# Patient Record
Sex: Male | Born: 1950 | Hispanic: Yes | Marital: Married | Smoking: Former smoker
Health system: Southern US, Community
[De-identification: ages and names within clinical notes are randomized; demographics above are authoritative.]

## PROBLEM LIST (undated history)

## (undated) DIAGNOSIS — E119 Type 2 diabetes mellitus without complications: Secondary | ICD-10-CM

## (undated) DIAGNOSIS — M869 Osteomyelitis, unspecified: Secondary | ICD-10-CM

## (undated) DIAGNOSIS — Z992 Dependence on renal dialysis: Secondary | ICD-10-CM

## (undated) DIAGNOSIS — N189 Chronic kidney disease, unspecified: Secondary | ICD-10-CM

## (undated) DIAGNOSIS — I2510 Atherosclerotic heart disease of native coronary artery without angina pectoris: Secondary | ICD-10-CM

## (undated) DIAGNOSIS — I509 Heart failure, unspecified: Secondary | ICD-10-CM

## (undated) HISTORY — PX: ARTERIAL THROMBECTOMY: SHX558

## (undated) HISTORY — PX: VASCULAR SURGERY: SHX849

## (undated) HISTORY — PX: FOOT SURGERY: SHX648

## (undated) HISTORY — DX: Chronic kidney disease, unspecified: N18.9

## (undated) HISTORY — PX: CARDIAC SURGERY: SHX584

## (undated) HISTORY — DX: Type 2 diabetes mellitus without complications: E11.9

---

## 2003-08-17 ENCOUNTER — Other Ambulatory Visit: Payer: Self-pay

## 2004-04-24 ENCOUNTER — Ambulatory Visit: Payer: Self-pay | Admitting: Pain Medicine

## 2004-06-04 ENCOUNTER — Ambulatory Visit: Payer: Self-pay | Admitting: Pain Medicine

## 2004-06-26 ENCOUNTER — Ambulatory Visit: Payer: Self-pay | Admitting: Pain Medicine

## 2004-06-27 ENCOUNTER — Ambulatory Visit: Payer: Self-pay | Admitting: Pain Medicine

## 2004-07-25 ENCOUNTER — Ambulatory Visit: Payer: Self-pay | Admitting: Pain Medicine

## 2004-08-12 ENCOUNTER — Ambulatory Visit: Payer: Self-pay | Admitting: Physician Assistant

## 2005-05-23 ENCOUNTER — Other Ambulatory Visit: Payer: Self-pay

## 2005-05-23 ENCOUNTER — Inpatient Hospital Stay: Payer: Self-pay | Admitting: Podiatry

## 2006-01-29 ENCOUNTER — Inpatient Hospital Stay: Payer: Self-pay | Admitting: Internal Medicine

## 2007-07-06 ENCOUNTER — Inpatient Hospital Stay: Payer: Self-pay | Admitting: Podiatry

## 2007-07-06 ENCOUNTER — Other Ambulatory Visit: Payer: Self-pay

## 2007-07-31 ENCOUNTER — Inpatient Hospital Stay: Payer: Self-pay | Admitting: Internal Medicine

## 2007-08-09 ENCOUNTER — Ambulatory Visit: Payer: Self-pay | Admitting: Family Medicine

## 2007-08-16 ENCOUNTER — Emergency Department: Payer: Self-pay | Admitting: Emergency Medicine

## 2008-06-20 ENCOUNTER — Ambulatory Visit: Payer: Self-pay | Admitting: Family Medicine

## 2008-07-04 ENCOUNTER — Ambulatory Visit: Payer: Self-pay | Admitting: Family Medicine

## 2008-07-11 ENCOUNTER — Ambulatory Visit: Payer: Self-pay | Admitting: Family Medicine

## 2008-07-28 ENCOUNTER — Ambulatory Visit: Payer: Self-pay | Admitting: Vascular Surgery

## 2008-09-21 ENCOUNTER — Inpatient Hospital Stay: Payer: Self-pay | Admitting: Student

## 2008-10-02 ENCOUNTER — Ambulatory Visit: Payer: Self-pay | Admitting: Family Medicine

## 2008-10-08 ENCOUNTER — Ambulatory Visit: Payer: Self-pay

## 2008-10-18 ENCOUNTER — Ambulatory Visit: Payer: Self-pay | Admitting: Family Medicine

## 2008-10-25 ENCOUNTER — Emergency Department: Payer: Self-pay | Admitting: Emergency Medicine

## 2008-10-30 ENCOUNTER — Ambulatory Visit: Payer: Self-pay | Admitting: Family Medicine

## 2008-11-01 ENCOUNTER — Ambulatory Visit: Payer: Self-pay | Admitting: Family Medicine

## 2008-11-06 ENCOUNTER — Ambulatory Visit: Payer: Self-pay | Admitting: Family Medicine

## 2008-11-16 ENCOUNTER — Ambulatory Visit: Payer: Self-pay | Admitting: Specialist

## 2008-11-27 ENCOUNTER — Ambulatory Visit: Payer: Self-pay | Admitting: Specialist

## 2008-11-28 ENCOUNTER — Ambulatory Visit: Payer: Self-pay | Admitting: Family Medicine

## 2008-12-12 ENCOUNTER — Ambulatory Visit: Payer: Self-pay | Admitting: Family Medicine

## 2008-12-13 ENCOUNTER — Ambulatory Visit: Payer: Self-pay | Admitting: Family Medicine

## 2008-12-27 ENCOUNTER — Ambulatory Visit: Payer: Self-pay | Admitting: Family Medicine

## 2009-01-02 ENCOUNTER — Inpatient Hospital Stay: Payer: Self-pay | Admitting: Internal Medicine

## 2009-01-15 ENCOUNTER — Ambulatory Visit: Payer: Self-pay | Admitting: Family Medicine

## 2009-01-23 ENCOUNTER — Emergency Department: Payer: Self-pay | Admitting: Emergency Medicine

## 2009-02-14 ENCOUNTER — Inpatient Hospital Stay: Payer: Self-pay | Admitting: Internal Medicine

## 2009-04-20 ENCOUNTER — Emergency Department: Payer: Self-pay | Admitting: Emergency Medicine

## 2009-06-19 ENCOUNTER — Inpatient Hospital Stay: Payer: Self-pay | Admitting: Internal Medicine

## 2010-02-04 ENCOUNTER — Inpatient Hospital Stay: Payer: Self-pay | Admitting: Internal Medicine

## 2010-03-01 ENCOUNTER — Ambulatory Visit: Payer: Self-pay | Admitting: Internal Medicine

## 2010-03-07 ENCOUNTER — Ambulatory Visit: Payer: Self-pay | Admitting: Internal Medicine

## 2010-03-08 ENCOUNTER — Ambulatory Visit: Payer: Self-pay | Admitting: Internal Medicine

## 2010-03-08 ENCOUNTER — Ambulatory Visit: Payer: Self-pay | Admitting: Emergency Medicine

## 2010-04-07 ENCOUNTER — Ambulatory Visit: Payer: Self-pay | Admitting: Internal Medicine

## 2010-05-07 ENCOUNTER — Ambulatory Visit: Payer: Self-pay | Admitting: Internal Medicine

## 2010-05-15 ENCOUNTER — Emergency Department: Payer: Self-pay | Admitting: Unknown Physician Specialty

## 2010-05-31 ENCOUNTER — Ambulatory Visit: Payer: Self-pay | Admitting: Internal Medicine

## 2010-05-31 ENCOUNTER — Ambulatory Visit: Payer: Self-pay | Admitting: Rheumatology

## 2010-06-05 ENCOUNTER — Emergency Department: Payer: Self-pay | Admitting: Emergency Medicine

## 2010-06-07 ENCOUNTER — Ambulatory Visit: Payer: Self-pay | Admitting: Internal Medicine

## 2010-06-25 ENCOUNTER — Emergency Department: Payer: Self-pay | Admitting: Emergency Medicine

## 2010-07-12 ENCOUNTER — Ambulatory Visit: Payer: Self-pay | Admitting: Internal Medicine

## 2010-08-07 ENCOUNTER — Ambulatory Visit: Payer: Self-pay | Admitting: Internal Medicine

## 2010-09-07 ENCOUNTER — Ambulatory Visit: Payer: Self-pay | Admitting: Internal Medicine

## 2010-10-22 ENCOUNTER — Ambulatory Visit: Payer: Self-pay | Admitting: Internal Medicine

## 2010-11-07 ENCOUNTER — Ambulatory Visit: Payer: Self-pay | Admitting: Internal Medicine

## 2010-12-05 ENCOUNTER — Inpatient Hospital Stay: Payer: Self-pay | Admitting: Internal Medicine

## 2010-12-23 ENCOUNTER — Emergency Department: Payer: Self-pay | Admitting: Emergency Medicine

## 2011-01-03 ENCOUNTER — Ambulatory Visit: Payer: Self-pay | Admitting: Vascular Surgery

## 2011-01-08 ENCOUNTER — Emergency Department: Payer: Self-pay | Admitting: *Deleted

## 2011-01-08 LAB — COMPREHENSIVE METABOLIC PANEL
Albumin: 3.3 g/dL — ABNORMAL LOW (ref 3.4–5.0)
Alkaline Phosphatase: 132 U/L (ref 50–136)
BUN: 14 mg/dL (ref 7–18)
Bilirubin,Total: 0.4 mg/dL (ref 0.2–1.0)
Chloride: 101 mmol/L (ref 98–107)
Co2: 28 mmol/L (ref 21–32)
Creatinine: 3.24 mg/dL — ABNORMAL HIGH (ref 0.60–1.30)
EGFR (African American): 25 — ABNORMAL LOW
Glucose: 167 mg/dL — ABNORMAL HIGH (ref 65–99)
SGPT (ALT): 26 U/L
Total Protein: 7.3 g/dL (ref 6.4–8.2)

## 2011-01-08 LAB — CBC
HCT: 34.2 % — ABNORMAL LOW (ref 40.0–52.0)
Platelet: 218 10*3/uL (ref 150–440)
RDW: 16.5 % — ABNORMAL HIGH (ref 11.5–14.5)
WBC: 5 10*3/uL (ref 3.8–10.6)

## 2011-01-14 LAB — CULTURE, BLOOD (SINGLE)

## 2011-02-11 ENCOUNTER — Ambulatory Visit: Payer: Self-pay | Admitting: Vascular Surgery

## 2011-02-11 LAB — BASIC METABOLIC PANEL
Anion Gap: 14 (ref 7–16)
BUN: 33 mg/dL — ABNORMAL HIGH (ref 7–18)
Creatinine: 5.07 mg/dL — ABNORMAL HIGH (ref 0.60–1.30)
EGFR (African American): 15 — ABNORMAL LOW

## 2011-02-11 LAB — CBC
HCT: 35.9 % — ABNORMAL LOW (ref 40.0–52.0)
HGB: 11.7 g/dL — ABNORMAL LOW (ref 13.0–18.0)
RBC: 3.94 10*6/uL — ABNORMAL LOW (ref 4.40–5.90)
RDW: 17.8 % — ABNORMAL HIGH (ref 11.5–14.5)
WBC: 4.7 10*3/uL (ref 3.8–10.6)

## 2011-02-14 ENCOUNTER — Ambulatory Visit: Payer: Self-pay | Admitting: Vascular Surgery

## 2011-02-27 ENCOUNTER — Ambulatory Visit: Payer: Self-pay | Admitting: Internal Medicine

## 2011-02-27 LAB — CBC CANCER CENTER
Basophil #: 0.1 x10 3/mm (ref 0.0–0.1)
Basophil %: 1.4 %
Eosinophil #: 0.4 x10 3/mm (ref 0.0–0.7)
HCT: 33.1 % — ABNORMAL LOW (ref 40.0–52.0)
HGB: 11 g/dL — ABNORMAL LOW (ref 13.0–18.0)
Lymphocyte #: 1.3 x10 3/mm (ref 1.0–3.6)
Lymphocyte %: 27.9 %
MCHC: 33.2 g/dL (ref 32.0–36.0)
MCV: 91 fL (ref 80–100)
Neutrophil #: 2.7 x10 3/mm (ref 1.4–6.5)
Platelet: 183 x10 3/mm (ref 150–440)
RBC: 3.64 10*6/uL — ABNORMAL LOW (ref 4.40–5.90)
RDW: 16 % — ABNORMAL HIGH (ref 11.5–14.5)

## 2011-03-01 ENCOUNTER — Inpatient Hospital Stay: Payer: Self-pay | Admitting: Internal Medicine

## 2011-03-01 LAB — CK TOTAL AND CKMB (NOT AT ARMC)
CK, Total: 171 U/L (ref 35–232)
CK-MB: 2.8 ng/mL (ref 0.5–3.6)

## 2011-03-01 LAB — COMPREHENSIVE METABOLIC PANEL
Alkaline Phosphatase: 97 U/L (ref 50–136)
Anion Gap: 11 (ref 7–16)
BUN: 29 mg/dL — ABNORMAL HIGH (ref 7–18)
Bilirubin,Total: 0.5 mg/dL (ref 0.2–1.0)
Chloride: 104 mmol/L (ref 98–107)
Co2: 24 mmol/L (ref 21–32)
EGFR (African American): 18 — ABNORMAL LOW
EGFR (Non-African Amer.): 15 — ABNORMAL LOW
SGOT(AST): 38 U/L — ABNORMAL HIGH (ref 15–37)
SGPT (ALT): 27 U/L
Sodium: 139 mmol/L (ref 136–145)
Total Protein: 6.7 g/dL (ref 6.4–8.2)

## 2011-03-01 LAB — TROPONIN I: Troponin-I: 0.15 ng/mL — ABNORMAL HIGH

## 2011-03-01 LAB — CBC
HCT: 32.2 % — ABNORMAL LOW (ref 40.0–52.0)
Platelet: 176 10*3/uL (ref 150–440)
RBC: 3.53 10*6/uL — ABNORMAL LOW (ref 4.40–5.90)
WBC: 4.4 10*3/uL (ref 3.8–10.6)

## 2011-03-02 LAB — LIPID PANEL
Cholesterol: 153 mg/dL (ref 0–200)
HDL Cholesterol: 56 mg/dL (ref 40–60)
Ldl Cholesterol, Calc: 75 mg/dL (ref 0–100)
Triglycerides: 108 mg/dL (ref 0–200)
VLDL Cholesterol, Calc: 22 mg/dL (ref 5–40)

## 2011-03-02 LAB — CBC WITH DIFFERENTIAL/PLATELET
Basophil #: 0 10*3/uL (ref 0.0–0.1)
Eosinophil %: 12.7 %
HCT: 32.2 % — ABNORMAL LOW (ref 40.0–52.0)
HGB: 10.6 g/dL — ABNORMAL LOW (ref 13.0–18.0)
Lymphocyte #: 1.4 10*3/uL (ref 1.0–3.6)
Monocyte #: 0.5 10*3/uL (ref 0.0–0.7)
Monocyte %: 10.6 %
Neutrophil %: 45.6 %
Platelet: 164 10*3/uL (ref 150–440)
RBC: 3.53 10*6/uL — ABNORMAL LOW (ref 4.40–5.90)
WBC: 4.5 10*3/uL (ref 3.8–10.6)

## 2011-03-02 LAB — COMPREHENSIVE METABOLIC PANEL
Bilirubin,Total: 0.3 mg/dL (ref 0.2–1.0)
Calcium, Total: 7.8 mg/dL — ABNORMAL LOW (ref 8.5–10.1)
Chloride: 103 mmol/L (ref 98–107)
Co2: 24 mmol/L (ref 21–32)
Creatinine: 5.6 mg/dL — ABNORMAL HIGH (ref 0.60–1.30)
EGFR (African American): 13 — ABNORMAL LOW
EGFR (Non-African Amer.): 11 — ABNORMAL LOW
Glucose: 147 mg/dL — ABNORMAL HIGH (ref 65–99)
Osmolality: 296 (ref 275–301)
SGPT (ALT): 25 U/L
Sodium: 142 mmol/L (ref 136–145)

## 2011-03-02 LAB — HEMOGLOBIN A1C: Hemoglobin A1C: 6.7 % — ABNORMAL HIGH (ref 4.2–6.3)

## 2011-03-02 LAB — TROPONIN I: Troponin-I: 0.15 ng/mL — ABNORMAL HIGH

## 2011-03-04 ENCOUNTER — Ambulatory Visit: Payer: Self-pay | Admitting: Oncology

## 2011-03-07 ENCOUNTER — Ambulatory Visit: Payer: Self-pay | Admitting: Internal Medicine

## 2011-04-16 ENCOUNTER — Ambulatory Visit: Payer: Self-pay | Admitting: Vascular Surgery

## 2011-04-16 LAB — GLUCOSE, RANDOM: Glucose: 141 mg/dL — ABNORMAL HIGH (ref 65–99)

## 2011-06-04 ENCOUNTER — Ambulatory Visit: Payer: Self-pay | Admitting: Vascular Surgery

## 2011-10-06 ENCOUNTER — Ambulatory Visit: Payer: Self-pay | Admitting: Vascular Surgery

## 2012-04-29 IMAGING — XA IR VASCULAR PROCEDURE
14 of 19 series · 15 of 24 positions shown · IV contrast (IODINE)
Comparison: none

[Series 1: care upper arm · 1 of 2 slices shown (1 of 14)]
[im 1/2]
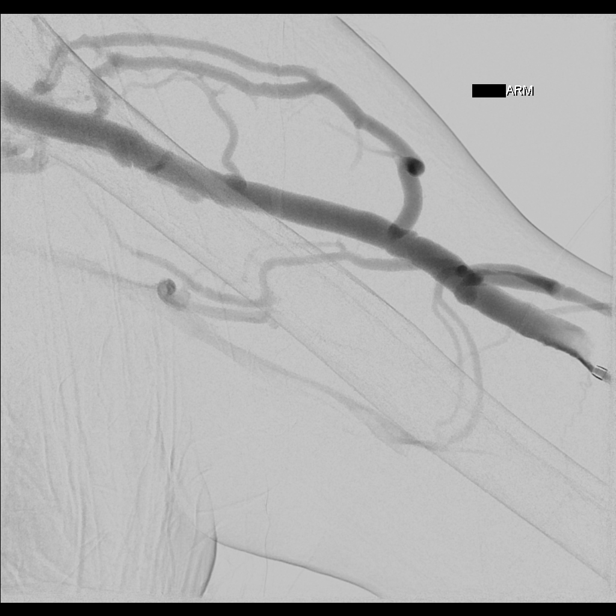

[Series 2: care upper arm · 1 of 2 slices shown (2 of 14)]
[im 1/2]
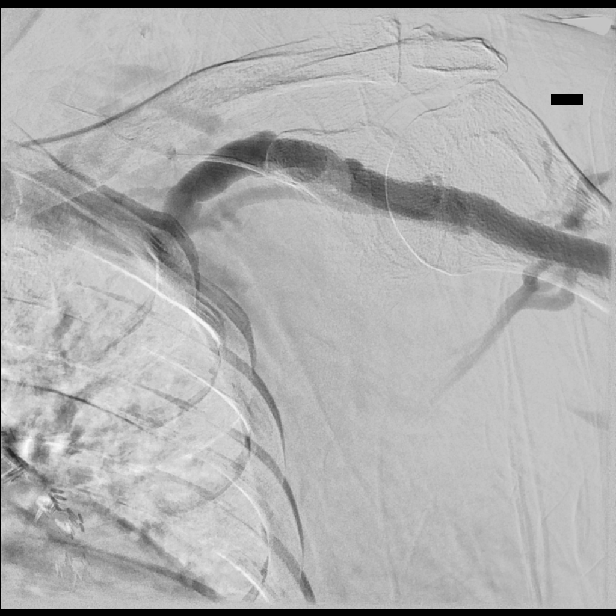

[Series 3: care upper arm · 2 of 2 slices shown (3 of 14)]
[im 1/2]
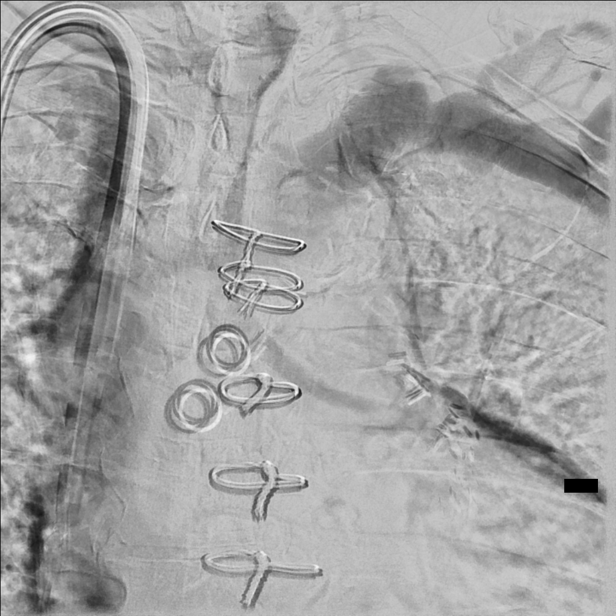
[im 2/2]
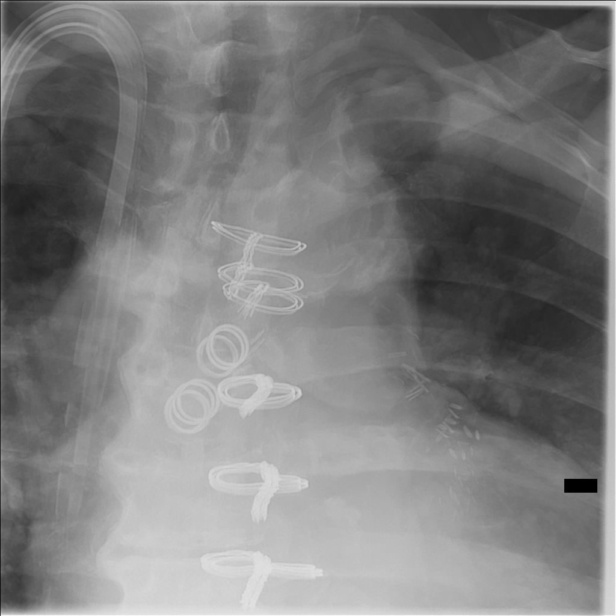

[Series 4: care upper arm · 1 of 2 slices shown (4 of 14)]
[im 2/2]
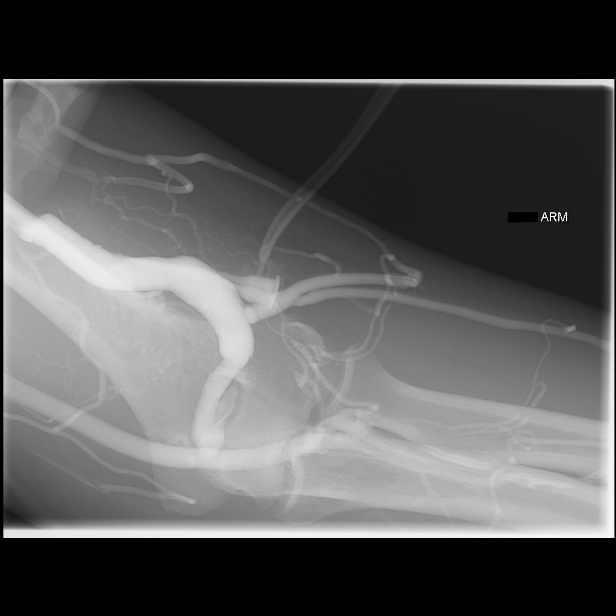

[Series 5: care upper arm · 1 of 2 slices shown (5 of 14)]
[im 1/2]
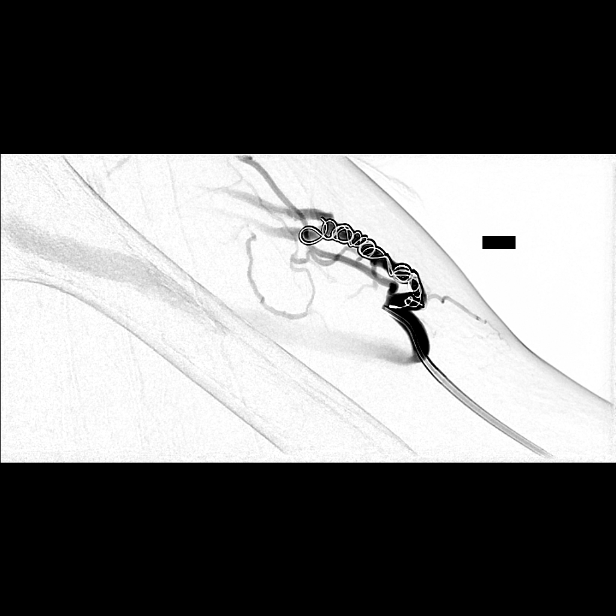

[Series 6: care upper arm · 1 of 2 slices shown (6 of 14)]
[im 1/2]
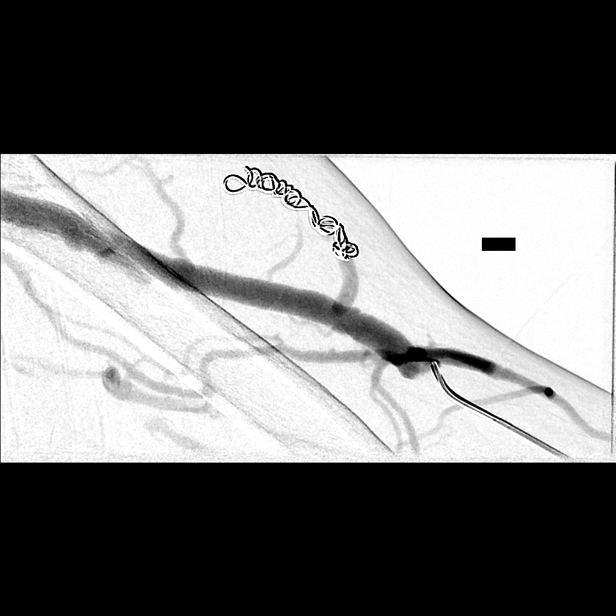

[Series 8: care upper arm · 1 of 2 slices shown (7 of 14)]
[im 1/2]
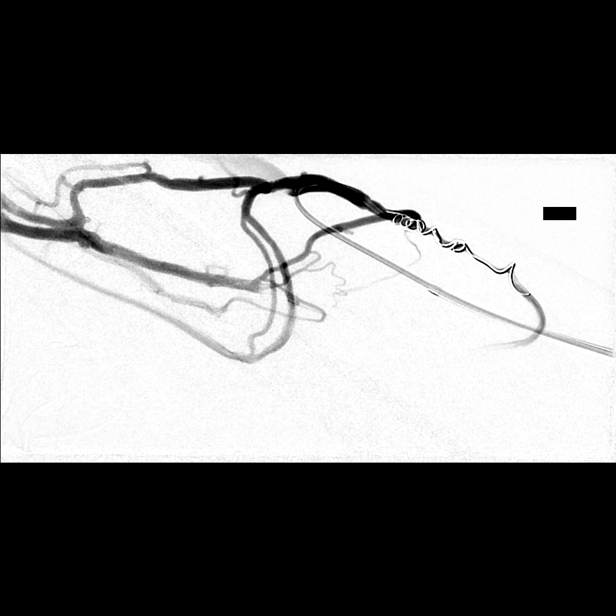

[Series 9: care upper arm · 1 of 2 slices shown (8 of 14)]
[im 1/2]
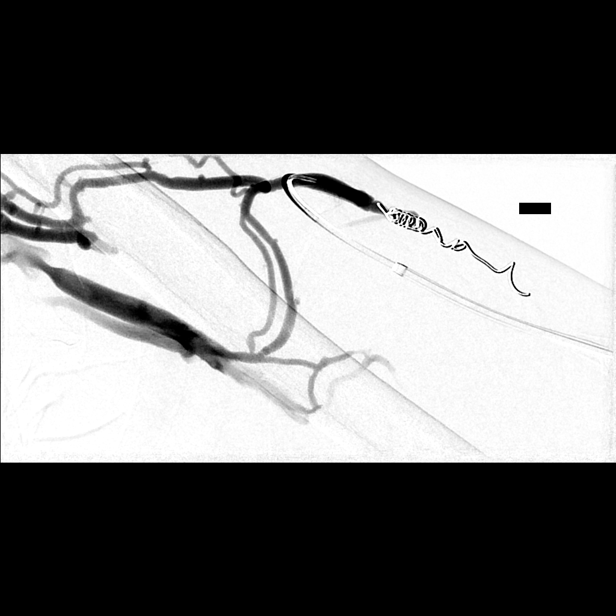

[Series 11: care upper arm · 1 of 2 slices shown (9 of 14)]
[im 1/2]
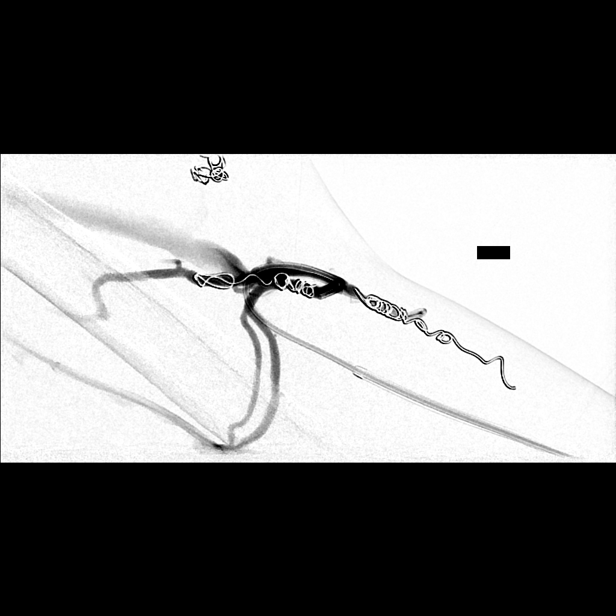

[Series 12: care upper arm · 1 of 2 slices shown (10 of 14)]
[im 1/2]
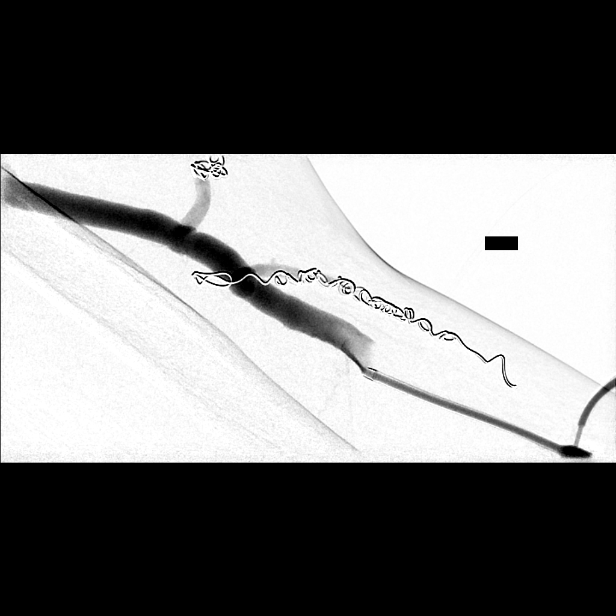

[Series 14: care upper arm · 1 of 2 slices shown (11 of 14)]
[im 1/2]
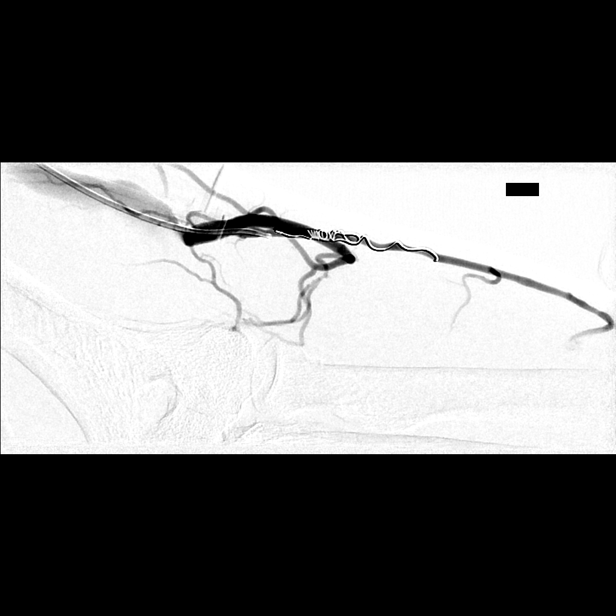

[Series 16: care upper arm · 1 of 2 slices shown (12 of 14)]
[im 1/2]
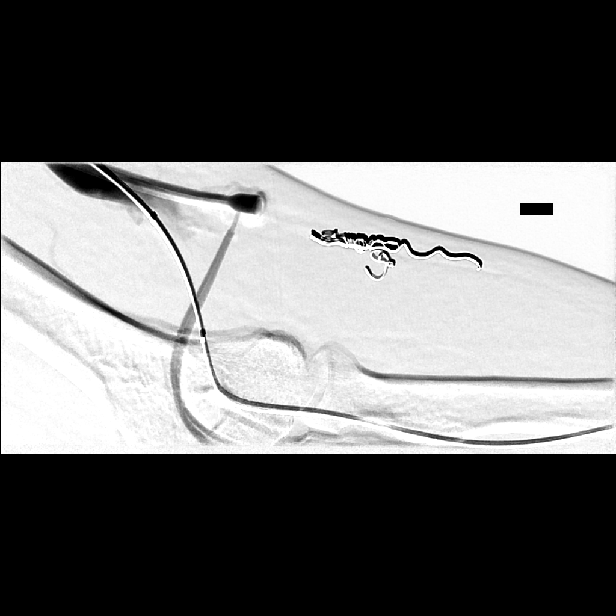

[Series 17: care upper arm · 1 of 2 slices shown (13 of 14)]
[im 1/2]
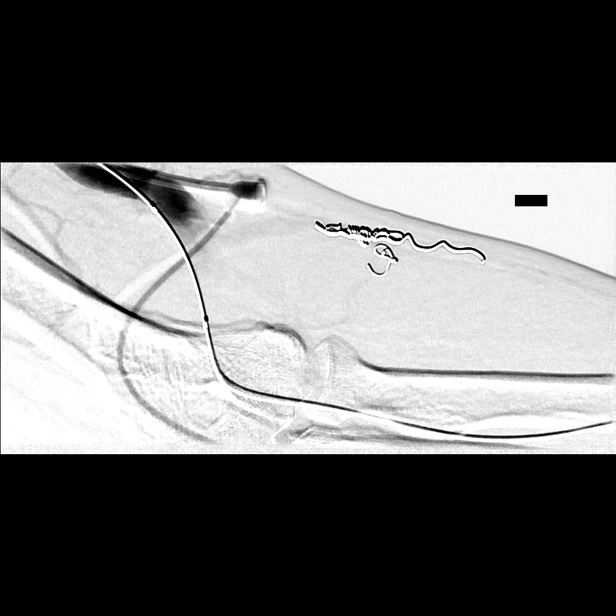

[Series 19: care upper arm · 1 of 2 slices shown (14 of 14)]
[im 1/2]
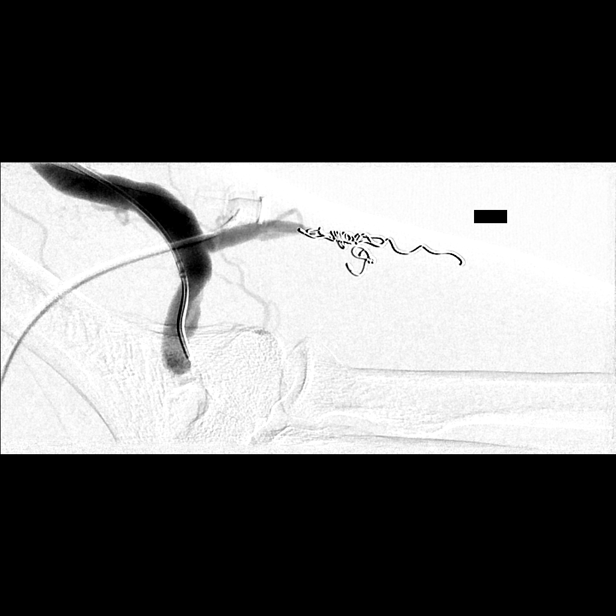

[15 of 24 positions shown; findings below may reference images not displayed]

IMAGES IMPORTED FROM THE [employer] WORKFLOW SYSTEM
NO DICTATION FOR STUDY

## 2012-06-08 ENCOUNTER — Ambulatory Visit: Payer: Self-pay | Admitting: Vascular Surgery

## 2012-07-13 ENCOUNTER — Ambulatory Visit: Payer: Self-pay | Admitting: Physician Assistant

## 2012-08-19 ENCOUNTER — Ambulatory Visit: Payer: Self-pay | Admitting: Vascular Surgery

## 2012-10-01 ENCOUNTER — Observation Stay: Payer: Self-pay | Admitting: Specialist

## 2012-10-01 LAB — BASIC METABOLIC PANEL
Anion Gap: 7 (ref 7–16)
BUN: 25 mg/dL — ABNORMAL HIGH (ref 7–18)
Calcium, Total: 8.5 mg/dL (ref 8.5–10.1)
Chloride: 96 mmol/L — ABNORMAL LOW (ref 98–107)
Co2: 26 mmol/L (ref 21–32)
Creatinine: 4.13 mg/dL — ABNORMAL HIGH (ref 0.60–1.30)
Potassium: 4.1 mmol/L (ref 3.5–5.1)
Sodium: 129 mmol/L — ABNORMAL LOW (ref 136–145)

## 2012-10-01 LAB — PROTIME-INR
INR: 1
Prothrombin Time: 13.4 secs (ref 11.5–14.7)

## 2012-10-01 LAB — CK TOTAL AND CKMB (NOT AT ARMC)
CK, Total: 166 U/L (ref 35–232)
CK-MB: 4.9 ng/mL — ABNORMAL HIGH (ref 0.5–3.6)

## 2012-10-01 LAB — CBC
HGB: 12.9 g/dL — ABNORMAL LOW (ref 13.0–18.0)
MCHC: 34.9 g/dL (ref 32.0–36.0)
Platelet: 145 10*3/uL — ABNORMAL LOW (ref 150–440)
RBC: 3.69 10*6/uL — ABNORMAL LOW (ref 4.40–5.90)

## 2012-10-01 LAB — TROPONIN I
Troponin-I: 0.09 ng/mL — ABNORMAL HIGH
Troponin-I: 0.09 ng/mL — ABNORMAL HIGH

## 2012-10-01 LAB — MAGNESIUM: Magnesium: 2 mg/dL

## 2012-10-02 LAB — LIPID PANEL
HDL Cholesterol: 48 mg/dL (ref 40–60)
Ldl Cholesterol, Calc: 95 mg/dL (ref 0–100)
VLDL Cholesterol, Calc: 27 mg/dL (ref 5–40)

## 2012-10-02 LAB — CBC WITH DIFFERENTIAL/PLATELET
Basophil %: 0.2 %
Eosinophil #: 0.3 10*3/uL (ref 0.0–0.7)
Eosinophil %: 8.4 %
Monocyte #: 0.4 x10 3/mm (ref 0.2–1.0)
RBC: 3.32 10*6/uL — ABNORMAL LOW (ref 4.40–5.90)
WBC: 3.9 10*3/uL (ref 3.8–10.6)

## 2012-10-02 LAB — BASIC METABOLIC PANEL
Anion Gap: 9 (ref 7–16)
BUN: 40 mg/dL — ABNORMAL HIGH (ref 7–18)
Calcium, Total: 8.4 mg/dL — ABNORMAL LOW (ref 8.5–10.1)
Creatinine: 6.21 mg/dL — ABNORMAL HIGH (ref 0.60–1.30)
EGFR (African American): 10 — ABNORMAL LOW
EGFR (Non-African Amer.): 9 — ABNORMAL LOW
Sodium: 129 mmol/L — ABNORMAL LOW (ref 136–145)

## 2012-10-02 LAB — TROPONIN I: Troponin-I: 0.09 ng/mL — ABNORMAL HIGH

## 2012-10-03 ENCOUNTER — Inpatient Hospital Stay: Payer: Self-pay | Admitting: Internal Medicine

## 2012-10-03 LAB — COMPREHENSIVE METABOLIC PANEL
Albumin: 3.5 g/dL (ref 3.4–5.0)
Anion Gap: 9 (ref 7–16)
BUN: 73 mg/dL — ABNORMAL HIGH (ref 7–18)
Calcium, Total: 8 mg/dL — ABNORMAL LOW (ref 8.5–10.1)
Chloride: 97 mmol/L — ABNORMAL LOW (ref 98–107)
Co2: 22 mmol/L (ref 21–32)
Creatinine: 8.73 mg/dL — ABNORMAL HIGH (ref 0.60–1.30)
EGFR (African American): 7 — ABNORMAL LOW
Osmolality: 282 (ref 275–301)
Potassium: 6 mmol/L — ABNORMAL HIGH (ref 3.5–5.1)
SGOT(AST): 23 U/L (ref 15–37)
SGPT (ALT): 19 U/L (ref 12–78)
Sodium: 128 mmol/L — ABNORMAL LOW (ref 136–145)
Total Protein: 7.8 g/dL (ref 6.4–8.2)

## 2012-10-03 LAB — CK TOTAL AND CKMB (NOT AT ARMC)
CK, Total: 105 U/L (ref 35–232)
CK-MB: 4 ng/mL — ABNORMAL HIGH (ref 0.5–3.6)

## 2012-10-03 LAB — TROPONIN I: Troponin-I: 0.07 ng/mL — ABNORMAL HIGH

## 2012-10-03 LAB — PHOSPHORUS: Phosphorus: 7.2 mg/dL — ABNORMAL HIGH (ref 2.5–4.9)

## 2012-10-03 LAB — CBC
HCT: 34.3 % — ABNORMAL LOW (ref 40.0–52.0)
MCV: 100 fL (ref 80–100)
WBC: 4.5 10*3/uL (ref 3.8–10.6)

## 2012-10-03 LAB — PROTIME-INR: Prothrombin Time: 14.8 secs — ABNORMAL HIGH (ref 11.5–14.7)

## 2012-10-04 LAB — BASIC METABOLIC PANEL
Chloride: 97 mmol/L — ABNORMAL LOW (ref 98–107)
Co2: 31 mmol/L (ref 21–32)
Glucose: 75 mg/dL (ref 65–99)
Osmolality: 276 (ref 275–301)
Sodium: 135 mmol/L — ABNORMAL LOW (ref 136–145)

## 2012-10-04 LAB — APTT
Activated PTT: 149.3 secs — ABNORMAL HIGH (ref 23.6–35.9)
Activated PTT: 111.5 secs — ABNORMAL HIGH (ref 23.6–35.9)

## 2012-10-04 LAB — PHOSPHORUS: Phosphorus: 5.2 mg/dL — ABNORMAL HIGH (ref 2.5–4.9)

## 2012-10-05 LAB — HEMOGLOBIN: HGB: 12.3 g/dL — ABNORMAL LOW (ref 13.0–18.0)

## 2012-10-05 LAB — BASIC METABOLIC PANEL
Anion Gap: 5 — ABNORMAL LOW (ref 7–16)
Calcium, Total: 8.5 mg/dL (ref 8.5–10.1)
Chloride: 96 mmol/L — ABNORMAL LOW (ref 98–107)
Co2: 33 mmol/L — ABNORMAL HIGH (ref 21–32)
Creatinine: 4.24 mg/dL — ABNORMAL HIGH (ref 0.60–1.30)
EGFR (Non-African Amer.): 14 — ABNORMAL LOW
Glucose: 73 mg/dL (ref 65–99)
Osmolality: 270 (ref 275–301)
Sodium: 134 mmol/L — ABNORMAL LOW (ref 136–145)

## 2012-11-01 ENCOUNTER — Emergency Department: Payer: Self-pay | Admitting: Emergency Medicine

## 2012-11-01 LAB — COMPREHENSIVE METABOLIC PANEL
Albumin: 3.7 g/dL (ref 3.4–5.0)
Alkaline Phosphatase: 158 U/L — ABNORMAL HIGH (ref 50–136)
BUN: 24 mg/dL — ABNORMAL HIGH (ref 7–18)
Calcium, Total: 8.1 mg/dL — ABNORMAL LOW (ref 8.5–10.1)
Chloride: 99 mmol/L (ref 98–107)
Creatinine: 4.06 mg/dL — ABNORMAL HIGH (ref 0.60–1.30)
EGFR (African American): 17 — ABNORMAL LOW
EGFR (Non-African Amer.): 15 — ABNORMAL LOW
Glucose: 109 mg/dL — ABNORMAL HIGH (ref 65–99)
Osmolality: 269 (ref 275–301)
Potassium: 3.6 mmol/L (ref 3.5–5.1)
SGOT(AST): 29 U/L (ref 15–37)
SGPT (ALT): 29 U/L (ref 12–78)
Sodium: 132 mmol/L — ABNORMAL LOW (ref 136–145)

## 2012-11-01 LAB — CBC
HCT: 30.1 % — ABNORMAL LOW (ref 40.0–52.0)
HGB: 10.6 g/dL — ABNORMAL LOW (ref 13.0–18.0)
MCH: 34.7 pg — ABNORMAL HIGH (ref 26.0–34.0)
MCHC: 35.1 g/dL (ref 32.0–36.0)
MCV: 99 fL (ref 80–100)
Platelet: 145 10*3/uL — ABNORMAL LOW (ref 150–440)
RDW: 15 % — ABNORMAL HIGH (ref 11.5–14.5)
WBC: 4 10*3/uL (ref 3.8–10.6)

## 2012-11-01 LAB — TROPONIN I: Troponin-I: 0.08 ng/mL — ABNORMAL HIGH

## 2013-01-17 ENCOUNTER — Encounter: Payer: Self-pay | Admitting: *Deleted

## 2013-01-18 ENCOUNTER — Ambulatory Visit: Payer: Self-pay | Admitting: Podiatry

## 2013-01-24 ENCOUNTER — Emergency Department: Payer: Self-pay | Admitting: Emergency Medicine

## 2013-01-24 LAB — CBC
HCT: 33.6 % — AB (ref 40.0–52.0)
HGB: 11.4 g/dL — ABNORMAL LOW (ref 13.0–18.0)
MCH: 33.7 pg (ref 26.0–34.0)
MCHC: 33.9 g/dL (ref 32.0–36.0)
MCV: 99 fL (ref 80–100)
PLATELETS: 190 10*3/uL (ref 150–440)
RBC: 3.38 10*6/uL — ABNORMAL LOW (ref 4.40–5.90)
RDW: 14.5 % (ref 11.5–14.5)
WBC: 5.3 10*3/uL (ref 3.8–10.6)

## 2013-01-24 LAB — COMPREHENSIVE METABOLIC PANEL
Albumin: 3.6 g/dL (ref 3.4–5.0)
Alkaline Phosphatase: 131 U/L — ABNORMAL HIGH
Anion Gap: 6 — ABNORMAL LOW (ref 7–16)
BUN: 28 mg/dL — ABNORMAL HIGH (ref 7–18)
Bilirubin,Total: 0.6 mg/dL (ref 0.2–1.0)
CREATININE: 4.72 mg/dL — AB (ref 0.60–1.30)
Calcium, Total: 8.5 mg/dL (ref 8.5–10.1)
Chloride: 97 mmol/L — ABNORMAL LOW (ref 98–107)
Co2: 28 mmol/L (ref 21–32)
EGFR (Non-African Amer.): 12 — ABNORMAL LOW
GFR CALC AF AMER: 14 — AB
Glucose: 107 mg/dL — ABNORMAL HIGH (ref 65–99)
OSMOLALITY: 269 (ref 275–301)
Potassium: 4.1 mmol/L (ref 3.5–5.1)
SGOT(AST): 32 U/L (ref 15–37)
SGPT (ALT): 41 U/L (ref 12–78)
Sodium: 131 mmol/L — ABNORMAL LOW (ref 136–145)
Total Protein: 8.4 g/dL — ABNORMAL HIGH (ref 6.4–8.2)

## 2013-01-24 LAB — TROPONIN I
TROPONIN-I: 0.11 ng/mL — AB
Troponin-I: 0.11 ng/mL — ABNORMAL HIGH

## 2013-07-11 ENCOUNTER — Ambulatory Visit: Payer: Self-pay | Admitting: Ophthalmology

## 2013-07-12 ENCOUNTER — Ambulatory Visit: Payer: Self-pay | Admitting: Ophthalmology

## 2014-04-25 NOTE — Op Note (Signed)
PATIENT NAME:  Travis [name], Deidrick A MR#:  [id number] DATE OF BIRTH:  Jun 09, 1950  DATE OF PROCEDURE:  10/06/2011  PREOPERATIVE DIAGNOSES:  1. Endstage renal disease.  2. Difficulties with function of the fistula on dialysis.  3. Hypertension.  4. Diabetes.   POSTOPERATIVE DIAGNOSES:  1. Endstage renal disease.  2. Difficulties with function of the fistula on dialysis.  3. Hypertension.  4. Diabetes.   PROCEDURES:  1. Ultrasound guidance for vascular access to left brachiocephalic AV fistula.  2. Left upper extremity fistulogram and central venogram.   SURGEON: Annice NeedyJason S. Rasheena Talmadge, M.D.   ANESTHESIA: Local with moderate conscious sedation.   ESTIMATED BLOOD LOSS: 25 mL.   FLUOROSCOPY TIME:  1 minute.  CONTRAST USED: 27 mL.  INDICATION FOR PROCEDURE: This is a 64 year old Hispanic male on dialysis. We are called by the dialysis center due to bleeding and difficulties with function of his fistula on dialysis. Fistulogram is requested. Risks and benefits and details of the procedure were discussed with the patient with the use of an interpreter and informed consent was obtained.    DESCRIPTION OF THE PROCEDURE: The patient was brought to the vascular and interventional radiology suite. The left upper extremity was sterilely prepped and draped, a sterile surgical field was created. The fistula was accessed under direct ultrasound guidance without difficulty with a micropuncture needle. Micropuncture wire and sheath were then placed. Imaging was performed through the micropuncture sheath including compression of the fistula to opacify the anastomotic area. The findings of the fistulogram were of no hemodynamically significant stenosis seen with any portion of the fistula. There were some mild irregularities in several areas and several small branches were identified. There was good flow through the central veins and there was no indication for intervention at this time.  At this point I elected to  terminate the procedure. A 4-0 Monocryl pursestring suture was placed and the sheath was removed. Pressure was held. Sterile dressing was placed.    ____________________________ Annice NeedyJason S. Jontrell Bushong, MD jsd:bjt D: 10/06/2011 11:39:29 ET T: 10/06/2011 11:46:51 ET JOB#: [id number]  cc: Annice NeedyJason S. Emma Schupp, MD, <Dictator> Annice NeedyJASON S Tayanna Talford MD ELECTRONICALLY SIGNED 10/07/2011 13:59

## 2014-04-28 NOTE — Consult Note (Signed)
Brief Consult Note: Diagnosis: CP.   Consult note dictated.   Comments: [name] has a PMH of DM, ESRD of HD, CAD, PAD who presented from dialysis with CP, diaphoresis, SOB. EKG stable from previous with no acute ST/T elevation and has h/o mildly elevated TNI. He has h/o anina and CAD not amenable to PCI and will tx medically as is high risk. Will add Ranexa, nitro paste and follow with you.  Electronic Signatures: Radene KneeKhan, Shaukat Ali (MD)   (Signed 27-Sep-14 09:18)  Co-Signer: Brief Consult Note Maia PlanManzi, Sunday Klos A (PA-C)   (Signed 26-Sep-14 14:20)  Authored: Brief Consult Note  Last Updated: 27-Sep-14 09:18 by Radene KneeKhan, Shaukat Ali (MD)

## 2014-04-28 NOTE — Discharge Summary (Signed)
PATIENT NAME:  Travis [name], Travis [name] MR#:  [id number] DATE OF BIRTH:  11-09-1950  DATE OF ADMISSION:  10/03/2012 DATE OF DISCHARGE:  10/05/2012  ADMITTING DIAGNOSIS: Generalized weakness and not feeling well.   DISCHARGE DIAGNOSES: 1.  Generalized weakness, not feeling well due to idioventricular heart rhythm due to hyperkalemia.  2.  Hyperkalemia due to end-stage renal disease. The patient is status post urgent dialysis.  3.  Chest pain with elevated troponins. Status post cardiac catheterization which shows unchanged cardiac catheterization with chronically occluded graft with patent two other grafts.  4.  End-stage renal disease on hemodialysis.  5.  Coronary artery disease, status post coronary artery bypass graft.  6.  Diabetes.  7.  Hypertension.  8.  Peripheral vascular disease.  9.  History of osteomyelitis status post amputation of the toe on the right foot.  10.  History of left cerebrovascular accident causes right leg weakness.  11.  Right transmetatarsal amputation.  12.  Status post arteriovenous fistula placement on the left.   CONSULTANTS: Cardiology, Dr. Adrian Blackwater as well as Dr. Thedore Mins of nephrology.   Cardiac catheterization shows high-grade lesions in the native mid LAD and  D1. No significant left circumflex or RCA disease. EF 50% to 55%. SVG to RCA occluded but the native RCA was okay. SVG to D1 patent with stents in the ostium. Patent LIMA to LAD.   HOSPITAL COURSE: Please refer to H and P done by the admitting physician. The patient is a pleasant, 64 year old Spanish male who was discharged recently from a hospital, presented back with complaint of not feeling well. His legs felt weak and dizzy. He was brought to the ED. He was also having severe chest pain. The patient's tests showed initially some atrial fibrillation and then he had idioventricular rhythm. He was found to have a troponin of 0.08. Therefore, we were asked to admit the patient. The patient was thought to  have idioventricular rhythm and atrial fibrillation due to severe hypokalemia. The patient was emergently dialyzed. He had no further arrhythmias. He was complaining of chest pain, therefore Cardiology consult was obtained. The patient underwent a cardiac catheterization with the above stable lesions. He was recommended to continue on aggressive medical regimen. Currently, he is chest pain-free. No further arrhythmias. He is stable for discharge.   LABORATORY EVALUATIONS: On admission, troponin 0.08. INR 1.1, PTT 33.1. WBC 4.5, hemoglobin 12, platelet count 129, glucose 163, BUN 73 creatinine is 8.73, sodium 128, potassium 6, chloride 97, CO2 was 20, calcium 8. LFTs are normal. EKG initially showing atrial fibrillation, second showing sinus rhythm with first-degree AV block, third with idioventricular rhythm at 40 beats per minute. Most recent potassium is 3.5 on the 30th.   DISCHARGE MEDICATIONS: Plavix 75 p.o. daily, isosorbide mononitrate 60 mg 1 tab p.o. b.i.d., Lantus 18 units at bedtime, Crestor 40 daily, iron sulfate 325 mg 1 tab t.i.d., calcium acetate 667 one cap p.o. daily, Sensipar 30 daily, vitamin D3 50,000 international units weekly, ranolazine 1000 mg 1 tab p.o. b.i.d., aspirin 81 mg 1 tab p.o. daily, Toprol-XL 25 p.o. daily.   DIET: Low-sodium, low-fat, low-cholesterol renal diet.   ACTIVITY: As tolerated.   FOLLOWUP: With primary M.D. in 1 to 2 weeks. Follow up with Dr. Welton Flakes of cardiology in 2 to 4 weeks.  Resume previous hemodialysis as doing previously.   NOTE: 35 minutes spent on this discharge.     ____________________________ Lacie Scotts. Allena Katz, MD shp:dp D: 10/06/2012 08:46:29 ET T: 10/06/2012 09:07:43 ET  JOB#: [id number]  cc: Maleeya Peterkin H. Allena KatzPatel, MD, <Dictator> Charise CarwinSHREYANG H Infinity Jeffords MD ELECTRONICALLY SIGNED 10/07/2012 16:22

## 2014-04-28 NOTE — H&P (Signed)
PATIENT NAME:  Travis [name], Sly A MR#:  [id number] DATE OF BIRTH:  Sep 26, 1950  DATE OF ADMISSION:  10/01/2012  CARDIOLOGY PHYSICIAN: Adrian BlackwaterShaukat Khan, MD  REFERRING EMERGENCY ROOM PHYSICIAN: Eartha InchCory R. York CeriseForbach, MD   CHIEF COMPLAINT: Chest pain.  The translator who helped me is Annice PihJackie with the patient experience department.   HISTORY OF PRESENTING ILLNESS: The patient is a Spanish-speaking 64 year old male with history of coronary artery disease, status post CABG, hypertension, diabetes, end-stage renal disease, on hemodialysis, who is having no pain, no complaints for the last few months, but today, while at dialysis center, he was about to finish 3 hours of dialysis, and he started having severe chest pain which was on the left side, 5 to 6 out of 10, and was constant, pressure-like. He was also having excessive sweating with that and was feeling nauseous, and blood pressure was fine as per the hemodialysis center records, and so that is why they decided to send him over here to the Emergency Room with this pain. In ER, he received morphine injection, which made the pain a little less, and the patient is more comfortable now, but he said that the pain is also going to his back, and he feels short of breath with this pain. He need to have supplemental oxygen with this pain. On further questioning, the patient denies any cough or fever and denies any similar pain in the past, and he did not have any pain while he was working in his back yard and walking without any support or any problem.   REVIEW OF SYSTEMS:  CONSTITUTIONAL: Negative for fever, fatigue, weakness, pain or weight loss.  EYES: No blurring, double vision or discharge.  EARS, NOSE, THROAT: No tinnitus, ear pain or hearing loss.  RESPIRATORY: No cough, wheezing, hemoptysis or shortness of breath.  CARDIOVASCULAR: Has some chest pain, but no orthopnea, edema, palpitations, arrhythmia. GASTROINTESTINAL: Was feeling nauseous with this pain episode  today, but denies any abdominal pain or vomiting or diarrhea.  GENITOURINARY: No dysuria, hematuria or increased frequency of urination.  ENDOCRINE: No increased sweating, heat or cold intolerance.  SKIN: No acne, rashes or lesions on the skin.  MUSCULOSKELETAL: No pain in neck, back or shoulders. No swelling or tenderness in the joints.  NEUROLOGICAL: No numbness, weakness, tremors or headache.  PSYCHIATRIC: No anxiety, insomnia or bipolar disorder.   PAST MEDICAL HISTORY:  1. End-stage renal disease, on hemodialysis.  2. Arteriovenous fistula on the left.  3. Coronary artery disease, status [facility]  4. [facility] for acute myocardial infarction.  5. Insulin-dependent diabetes mellitus with medication noncompliance in the past.  6. Hypertension. 7. Peripheral vascular disease.  8. 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  9. History of left CVA, causing right-sided leg weakness in the past.   PAST SURGICAL HISTORY:  1. Coronary artery bypass graft. 2. Right transmetatarsal amputation.   DRUG ALLERGIES: No known drug allergies.   SOCIAL HISTORY: Nonsmoker. Nondrinker. No illicit drug use. Lives with wife. Disabled due to diabetes and peripheral vascular disease.   FAMILY HISTORY: Noncontributory. Father is healthy and mother died of old age.   HOME MEDICATIONS: As confirmed by pharmacy technician in ER:  1. Vitamin D3 50,000 units 1 capsule once a week.  2. Sensipar 30 mg oral tablet once a day.  3. Plavix 75 mg once a day.  4. Lantus 18 units subcutaneous once a day.  5. Isosorbide mononitrate take 60 mg oral tablet 2 times a day. 6. Ferrous sulfate 325 mg 3 times a day.  7. Crestor 40 mg oral once a day.  8. Calcium acetate 667 mg oral capsule once a day.   PHYSICAL EXAMINATION:  VITAL SIGNS: In ER, temperature 98.1, pulse 70,  blood pressure 150/72, respirations 20 and pulse oximetry 97 on room air.  GENERAL: The patient is fully alert and oriented to time, place and person and does not appear in any acute distress.  HEENT: Head and neck atraumatic. Conjunctivae pink. Oral mucosa moist.  NECK: Supple. No JVD.  RESPIRATORY: Bilaterally clear and equal air entry.  CARDIOVASCULAR: S1, S2 present. Murmur present all over.  ABDOMEN: Soft, nontender. Bowel sounds present. No organomegaly.  SKIN: No rashes.  LEGS: Right-sided transmetatarsal amputation present. Some darkening of the skin on the right foot. No edema. No joint swelling or tenderness.  NEUROLOGICAL: Power 5 out of 5. Follows commands. Moves all 4 limbs. No gross abnormality.  PSYCHIATRIC: Does not appear in any acute psychiatric illness at this time.   IMPORTANT LABORATORY RESULTS: Glucose 157, BUN 25, creatinine 4.13, sodium 129, potassium 4.1, chloride 96, CO2 26, magnesium 2.0. Troponin 0.09. WBC 3.0, hemoglobin 12.9, platelets 145, MCV 100. INR 1. Chest x-ray, portable, shows some prominence of right-sided pulmonary artery which might be due to positioning.   ASSESSMENT AND PLAN: A 64 year old guy who has history of coronary artery bypass grafting in the past and end-stage renal disease. Today, while having dialysis, started having chest pain with sweating, pain feels a little better with morphine.   1. Chest pain. Will observe him on telemetry floor. Will do 3 sets of troponin, and as per the patient, his cardiac workup done in the office with Dr. Adrian Blackwater 2 months ago was negative, so I will call the consult with cardiology, Dr. Adrian Blackwater, and if he feels, then we can discharge him without any further workup because it was done recently.  2. Hypertension. Will continue his isosorbide mononitrate as he was taking at home for his angina.  3. Diabetes. Will continue Lantus 17 units at night and insulin sliding scale coverage.  4. Hyperlipidemia. Will  continue rosuvastatin.  5. Coronary artery disease, status post coronary artery bypass grafting. Will continue aspirin and Plavix as he was taking at home.  6. Chronic anemia. Will continue iron supplementation orally.   CODE STATUS: Full code.   TOTAL TIME SPENT ON THIS ADMISSION: 50 minutes.  ____________________________ Hope Pigeon Elisabeth Pigeon, MD vgv:OSi D: 10/01/2012 13:48:02 ET T: 10/01/2012 14:07:37 ET JOB#: [id number]  cc: Hope Pigeon. Elisabeth Pigeon, MD, <Dictator> Laurier Nancy, MD Altamese Dilling MD ELECTRONICALLY SIGNED 10/01/2012 18:42

## 2014-04-28 NOTE — Op Note (Signed)
PATIENT NAME:  Travis [name], [name] MR#:  [id number] DATE OF BIRTH:  1950/01/14  DATE OF PROCEDURE:  08/19/2012  PREOPERATIVE DIAGNOSES: 1.  End-stage renal disease with poorly functioning dialysis access.  2.  Hypertension. 3.  Anemia of chronic disease.  POSTOPERATIVE DIAGNOSES: 1.  End-stage renal disease with poorly functioning dialysis access.  2.  Hypertension. 3.  Anemia of chronic disease.  PROCEDURES: 1.  Ultrasound guidance for vascular access to left upper arm arteriovenous fistula.  2.  Left upper extremity fistulogram and central venogram.   SURGEON: Annice NeedyJason S. Kinzlee Selvy, M.D.   ANESTHESIA: Local with moderate conscious sedation.   ESTIMATED BLOOD LOSS: Minimal.   INDICATION FOR PROCEDURE: A 64 year old Hispanic male with end-stage renal disease. He has a dialysis access which has not been running well on dialysis. We were asked to assess this.   DESCRIPTION OF PROCEDURE: The patient's left upper extremity was sterilely prepped and draped, and a sterile surgical field was created. The fistula was accessed in the midportion of the upper arm under direct ultrasound guidance without difficulty with a micropuncture needle. Micropuncture wire and sheath were placed and upsized to a 6-French sheath. Imaging was performed to evaluate the left upper extremity AV fistula, and this demonstrated a very large patent fistula without any areas of significant stenosis. There were several small to medium size branch in the upper arm, but there was no more than 25% stenosis at the cephalic vein/subclavian vein confluence and no significant stenosis near the arterial anastomosis. Central veins were also patent. At this point, I elected to terminate the procedure. The sheath was removed around a 4-0 Monocryl pursestring suture, pressure was held and sterile dressing was placed. The patient tolerated the procedure well and was taken to the recovery room in stable condition.  ____________________________ Annice NeedyJason  S. Chinara Hertzberg, MD jsd:sb D: 08/19/2012 11:38:54 ET T: 08/19/2012 12:00:33 ET JOB#: [id number]  cc: Annice NeedyJason S. Louie Flenner, MD, <Dictator> Mosetta PigeonHarmeet Singh, MD Annice NeedyJASON S Kalima Saylor MD ELECTRONICALLY SIGNED 08/23/2012 16:08

## 2014-04-28 NOTE — Consult Note (Signed)
PATIENT NAME:  Travis [name], Travis [name] MR#:  [id number] DATE OF BIRTH:  [birth year]  DATE OF CONSULTATION:  10/01/2012  REFERRING PHYSICIAN:  Altamese Dilling, MD PRIMARY CARE PHYSICIAN: Maryruth Hancock, MD CONSULTING PHYSICIAN:  Adrian Blackwater, MD DICTATED BY: Verta Ellen, PA-C  REASON FOR CONSULTATION: Chest pain.   HISTORY OF PRESENT ILLNESS: [name] is a pleasant 64 year old Hispanic male who is well known to our practice. The patient has a history of diabetes mellitus, end-stage renal disease on hemodialysis, coronary artery disease, congestive heart failure, and medical noncompliance.   The patient was at dialysis today and shortly after being put on the dialysis machine, experienced shortness of breath, nausea, chest pain, and diaphoresis. The patient described the chest pain as a heaviness in his mid to left chest that was a 10/10 on the pain scale and also had some normal to low blood pressures.   He denies any coughing, orthopnea but has had some pedal edema. He was brought to the Emergency Department here where he had an EKG and cardiac enzymes. The patient currently complains of chest pain that is pleuritic, 10 on the pain scale, and overall feels much better.   PAST MEDICAL HISTORY:  1.  Hypertension.  2.  Coronary artery disease.  3.  History of prior MI with coronary artery bypass graft and coronary artery bypass graft stenosis.  4.  Congestive heart failure.  5.  Peripheral vascular disease.  6.  History of chronic edema.  7.  End-stage renal disease, on dialysis 3 days per week.  8.  Hyperlipidemia.  9.  Diabetes mellitus.  10.  Osteoarthritis.   PAST SURGICAL HISTORY:  1.  Fistula for dialysis.  2.  Knee arthroscopy, bilaterally.  3.  Coronary bypass surgery and cardiac stents with his cardiac work-up in the past being done at [facility].   ALLERGIES: No known drug allergies.   HOME MEDICATIONS: (This is taken from his last office visit done on  08/31/2012).  1.  Colace 100 mg p.o. b.i.d.  2.  Crestor 40 mg p.o. at bedtime.  3.  Ferrous sulfate 325 mg three times daily.  4.  Hydralazine 100 mg p.o. t.i.d.  5.  Isosorbide dinitrate 20 mg two tabs 3 times daily.  6.  Lantus 18 units once daily.  7.  Nitrostat 0.4 mg sublingually as needed for chest pain.  8.  Plavix 75 mg p.o. daily.  9.  Sensipar 30 mg, use as directed.  10.  Vitamin D 50,000 caps once weekly.   SOCIAL HISTORY: The patient has never smoked in the past, denies alcohol or illicit drug use. He is currently married.   FAMILY HISTORY: Family history of heart disease.   REVIEW OF SYSTEMS: GENERAL: Complains of some fatigue. Denies any fevers, weight changes.  EYES: Denies any blurry vision.  ENT: Denies any tinnitus, epistaxis.  RESPIRATORY: Complains of shortness of breath as mentioned in the HPI.  CARDIOVASCULAR: With chest pain. Denies orthopnea.  GASTROINTESTINAL: Did have nausea. Denies vomiting or abdominal pain.  EXTREMITIES: Chronic lower extremity edema.   PHYSICAL EXAMINATION:  GENERAL: A pleasant Hispanic male who does not appear to be in any acute distress. He is alert and oriented x3.  VITAL SIGNS: Temperature 98.1 degrees Fahrenheit, heart rate 72, respiratory rate 18, blood pressure 132/72.  HEENT: Head atraumatic, normocephalic. Eyes: Pupils are round and equal. There is no scleral icterus. Conjunctivae are pale, pink. Ears and nose are normal to external inspection.  MOUTH: Poor  dentition with moist mucous membranes.  NECK: Supple. Trachea is midline. Thyroid is smooth and mobile. No carotid bruits.  LUNGS: Clear to auscultation bilaterally. No adventitious breath sounds. No accessory muscle use.  CARDIOVASCULAR: Regular rate and rhythm. No murmurs, rubs, or gallops appreciated.  ABDOMEN: Obese. Bowel sounds are present in all four quadrants. It is soft and nontender to palpation.  EXTREMITIES: With chronic venostasis changes. He has 1 to 2+ pedal  edema bilaterally.   ANCILLARY DATA: EKG on admission: Normal sinus rhythm, 70 beats a minute, right bundle branch block, T-wave inversion in the inferolateral leads with old evidence of septal infarct.   Chest x-ray: Chronic scarring versus small effusion at the left costophrenic angle.   Echocardiogram done in our office in 02/13/2012 with left ventricular ejection fraction of 41%, mild septal hypokinesis, mild left ventricular hypertrophy, mild diastolic dysfunction, mild pulmonary regurgitation, moderate to severe tricuspid regurgitation, moderate pulmonary hypertension, moderate to severe mitral regurgitation, mild aortic regurgitation.   Nuclear stress test done on 02/11/2012 with an infarction in the LAD territory with residual ischemia and preserved left ventricular ejection fraction.   CTA with runoff, 07/13/2012, with bilateral multifocal trifurcation occlusive disease, cardiomegaly with coronary artery disease and left-sided pleural effusion. (The patient sees [facility] for his peripheral arterial disease).   ASSESSMENT AND PLAN:  1.  Chest pain. The patient has known coronary artery disease status post stenting and coronary bypass grafting. His chest pain is greatly improved at this time and will continue to treat him with aggressive medical management as he had previous cardiac catheterization in the past and has not been amenable to percutaneous coronary intervention and is a high-risk patient. We will add Ranexa and nitroglycerin paste to his current regimen and continue to monitor the patient with you.  2. CHF-last LVEF 41% and no signs of acute decompensation 2.  End-stage renal disease, on hemodialysis three day per week. The patient is being followed by nephrology. 3.  Diabetes mellitus, type 2. The patient is currently on insulin and is being managed by hospitalist.   Thank you very much for this consultation and allowing us to participate in this patient's  care.   ____________________________ Verta EllenMonica A. Cinthya Bors, PA-C mam:np D: 10/01/2012 14:29:00 ET T: 10/01/2012 15:04:59 ET JOB#: [id number]  cc: Verta EllenMonica A. Amany Rando, PA-C, <Dictator> Annice NeedyJason S. Dew, MD Maryruth HancockSallie Patel, MD Terrin Meddaugh A [facility] PA ELECTRONICALLY SIGNED 10/01/2012 16:23

## 2014-04-28 NOTE — H&P (Signed)
PATIENT NAME:  Travis [name], ADVANI MR#:  [id number] DATE OF BIRTH:  1950/11/02  DATE OF ADMISSION:  10/03/2012  PRIMARY CARE PHYSICIAN:  Laurier Nancy, MD  CHIEF COMPLAINT: Not feeling well.   HISTORY OF PRESENT ILLNESS: This is a 64 year old man who was just discharged from the hospital. He presents back to the hospital with not feeling well. He says his legs felt weak and dizzy, and he had sit down. He could not eat or drink. He developed chest pain in the center of his chest, severe in nature, 9 out of 10 in intensity. He felt like he could not get any air. He got hot and sweaty, nauseous and he called 911. In the ER, he had 3 EKGs done, 1 showing atrial fibrillation, 1 showing normal sinus rhythm and then he went into an idioventricular rhythm. Laboratory data he was found to have a borderline troponin at 0.08 and elevated potassium at 6.0 and a low sodium at 128. Hospitalist services were contacted for further evaluation.   PAST MEDICAL HISTORY 1.  End-stage renal disease, on hemodialysis at [facility] on Monday, Wednesday, Friday.  2.  Coronary artery disease, status post CABG.  3.  Diabetes.  4.  Hypertension.  5.  Peripheral vascular disease.  6.  History of osteomyelitis, status post amputation of the toes on the right foot.  7.  History of left CVA, cause of right leg weakness.   PAST SURGICAL HISTORY: Coronary artery bypass grafting, right transmetatarsal amputation, AV fistula on the left.   ALLERGIES: No known drug allergies.   SOCIAL HISTORY: No smoking. No alcohol. No drug use. Lives with his wife.   FAMILY HISTORY: Father is healthy. Mother died of diabetic complications.   MEDICATIONS: As per prescription writer include calcium acetate 667 mg 1 tablet daily, Crestor 40 mg at bedtime, ferrous sulfate 325 mg 3 times a day, Imdur 60 mg twice a day, Lantus 18 units subcutaneous injection at bedtime, Plavix 75 mg daily, Ranexa 1000 mg twice a day, Sensipar 30 mg  daily, vitamin D 50,000 units weekly.   REVIEW OF SYSTEMS CONSTITUTIONAL: Positive for sweating. No fever, no chills. Positive for weight loss with dialysis. No weakness or fatigue.  EYES: No blurry vision.  EARS, NOSE, MOUTH AND THROAT: No hearing loss. No sore throat. No difficulty swallowing.  CARDIOVASCULAR: Positive for chest pain.  RESPIRATORY: Positive for shortness of breath. No cough. No sputum. No hemoptysis.  GASTROINTESTINAL: Positive for nausea, retching. No abdominal pain. Positive for constipation. No bright red blood per rectum. No melena.  GENITOURINARY: No burning on urination. No hematuria.  MUSCULOSKELETAL: Cramps in the legs.  NEUROLOGIC: No fainting or blackouts.  PSYCHIATRIC: No anxiety or depression.  ENDOCRINE: No thyroid problems.  HEMATOLOGIC AND LYMPHATIC: History of anemia.   PHYSICAL EXAMINATION VITAL SIGNS: Temperature 97.5, pulse 40, respirations 18, blood pressure 131/63, pulse ox 100%.  GENERAL: No respiratory distress.  EYES: Conjunctivae and lids normal. Pupils equal, round and reactive to light. Extraocular muscles intact. No nystagmus.  EARS, NOSE, MOUTH AND THROAT: Tympanic membrane blocked by wax. Nasal mucosa no erythema. Throat no erythema, no exudate seen. Lips and gums no lesions.  NECK: No JVD. No bruits. No lymphadenopathy. No thyromegaly. No thyroid nodules palpated.  LUNGS: Clear to auscultation. No use of accessory muscles to breathe. No rhonchi, rales or wheeze heard.  CARDIOVASCULAR SYSTEM: S1, S2 normal. Bradycardic. No gallops, rubs or murmurs heard. Carotid upstroke 2+ bilaterally. No bruits. Dorsalis pedis  pulses 1+ bilaterally. Trace edema of the lower extremity.  ABDOMEN: Soft, nontender. No organomegaly/splenomegaly. Normoactive bowel sounds. No masses felt.  LYMPHATIC: No lymph nodes in the neck.  MUSCULOSKELETAL: No clubbing. Trace edema. No cyanosis.  SKIN: No ulcers or lesions seen.  NEUROLOGIC: Cranial nerves II through XII  grossly intact. Deep tendon reflexes 1+ bilateral lower extremities.  PSYCHIATRIC: The patient is oriented to person, place and time.   LABORATORY AND RADIOLOGICAL DATA: Troponin borderline at 0.08. INR 1.1, PT 14.8, PTT 33.1. White blood cell count 4.5, H and H 12.0 and 34.3, platelet count of 129. Glucose 163, BUN 73, creatinine 8.73, sodium 128, potassium 6.0, chloride 97, CO2 22, calcium 8.0. Liver function tests are normal range. Three EKGs, first showing atrial fibrillation, second showing sinus rhythm with first-degree AV block and third idioventricular rhythm at 40 beats per minute.   ASSESSMENT AND PLAN 1.  Idioventricular rhythm, abnormal EKG with hyperkalemia, all indications for stat dialysis. We will admit to the Critical Care Unit just in case the dialysis does not improve the heart rhythm. The patient already given calcium gluconate IV and a dose of Kayexalate. Will get up to the Intensive Care Unit for stat dialysis. We will give D50 and 10 units of IV insulin also.  2.  Coronary artery disease with chest pain and elevated troponin. When I was in the room, the patient was sweating and actively retching. Need to rule out myocardial infarction. The patient  given aspirin, continue Plavix, will start on a heparin drip. No beta blocker secondary to bradycardia.  3.  End-stage renal disease on dialysis Monday, Wednesday and Friday. Will need a stat dialysis  now.  4.  Diabetes. We will continue Lantus and sliding scale.  5.  Hypertension. Blood pressure is holding even though heart rate is on the lower side. Will continue to monitor closely in the Intensive Care Unit.  6.  Peripheral vascular disease. On Plavix.  7.  History of cerebrovascular accident. On Plavix.  8.  Hyperlipidemia. We will check a lipid profile in the morning. Continue Crestor.  9.  Thrombocytopenia. This has been trending down over a while. Continue to monitor, especially on heparin drip.   TIME SPENT ON ADMISSION: 60  minutes critical care time.   ____________________________ Herschell Dimesichard J. Renae GlossWieting, MD rjw:cs D: 10/03/2012 14:55:53 ET T: 10/03/2012 15:17:00 ET JOB#: [id number]  cc: Herschell Dimesichard J. Renae GlossWieting, MD, <Dictator> Laurier NancyShaukat A. Khan, MD [facility] [place]  Salley ScarletICHARD J Nakima Fluegge MD ELECTRONICALLY SIGNED 10/09/2012 13:46

## 2014-04-28 NOTE — Consult Note (Signed)
PATIENT NAME:  Travis [name], [name] MR#:  [id number] DATE OF BIRTH:  Dec 17, 1950  DATE OF CONSULTATION:  10/04/2012  REFERRING PHYSICIAN:   CONSULTING PHYSICIAN:  Laurier NancyShaukat A. Mariaclara Spear, MD  HISTORY OF PRESENT ILLNESS: This is a 64 year old Hispanic male with a past medical history of end-stage renal disease on hemodialysis, severe coronary artery disease and severe peripheral vascular disease who was admitted with chest pain on Friday and during dialysis he started having chest pain. His chest pain got better. We added Ranexa 1000 b.i.d. and was discharged on Saturday only to come back Sunday with episode where he nearly passed out. He felt dizzy, lightheaded and he had to call ambulance and he was brought to the Emergency Room where he was found to be in idioventricular rhythm and potassium was 6. He had emergent dialysis yesterday and he is feeling much better. His troponin was borderline, 0.08, and his sodium was low, 128.   PAST MEDICAL HISTORY: 1.  End-stage renal disease. 2.  Coronary artery disease status post CABG. 3.  Diabetes. 4.  Hypertension. 5.  Peripheral vascular disease. 6.  Left CVA. 7.  Osteomyelitis.   ALLERGIES: No known drug allergies.   SOCIAL HISTORY: Denies EtOH abuse or smoking.   FAMILY HISTORY: Positive for coronary artery disease.  MEDICATIONS:  1.  Plavix 75 mg p.o. daily.  2.  Crestor 40 mg p.o. daily. 3.  Imdur 60 mg b.i.d..  4.  Ranexa 1000 mg b.i.d.   PHYSICAL EXAMINATION: GENERAL: He is alert and oriented x 3. Right now feeling much better. He denies any chest pain.  VITAL SIGNS: Blood pressure is 184/95, respirations 33, pulse 76 and temperature 98.  NECK: No JVD.  LUNGS: Good air entry.  HEART: Regular rate and rhythm. Normal S1, S2. No audible murmur.  ABDOMEN: Soft, nontender, positive bowel sounds.  EXTREMITIES: No pedal edema.  NEUROLOGIC: Appears to be intact.   DIAGNOSTICS: EKG shows now sinus rhythm, about 60 beats per minute, right bundle branch  block, nonspecific ST-T changes. EKG also has sinus bradycardia.   ASSESSMENT AND PLAN: Status post idioventricular rhythm, back in sinus rhythm now after dialysis. He had hyperkalemia. He was dialyzed. His potassium is under 4 now. He is feeling much better, but has anginal-type chest pain. Troponin is not significantly elevated. It is always mildly elevated whenever he gets admitted. Will watch him. He may be moved to telemetry. We will continue to treat him medically at this time because he has severe peripheral vascular disease and coronary artery disease, which is not amenable to PCI. Thank you very much for the referral. ____________________________ Laurier NancyShaukat A. Denzal Meir, MD sak:sb D: 10/04/2012 08:56:28 ET T: 10/04/2012 09:17:30 ET JOB#: [id number]  cc: Laurier NancyShaukat A. Nera Haworth, MD, <Dictator> Laurier NancySHAUKAT A Kauri Garson MD ELECTRONICALLY SIGNED 10/05/2012 11:49

## 2014-04-28 NOTE — Op Note (Signed)
PATIENT NAME:  Travis [name], Travis [name] MR#:  [id number] DATE OF BIRTH:  [birth year]  DATE OF PROCEDURE:  06/08/2012  PREOPERATIVE DIAGNOSIS: Complication AV fistula, left arm.   POSTOPERATIVE DIAGNOSIS: Complication AV fistula, left arm.   PROCEDURE PERFORMED: Contrast injection left arm fistula.   SURGEON: Renford DillsGregory G. Nyonna Hargrove, M.D.   SEDATION: Versed 3 mg plus fentanyl 100 mcg administered IV. Continuous ECG, pulse oximetry and cardiopulmonary monitoring is performed throughout the entire procedure by the interventional radiology nurse. Total sedation time was 45 minutes.   ACCESS: 6-French sheath, antegrade direction, left arm brachiocephalic fistula.   FLUOROSCOPY TIME: 0.6 minutes.   CONTRAST USED: Isovue 25 mL.   INDICATIONS: [name] is [name] who presented to the office with the complaint of increased bleeding and pain at his fistula site following decannulation. Noninvasive studies suggested velocity shifts both proximally and distally. He is therefore undergoing angiography with the hope for intervention. The risks and benefits were reviewed. All questions were answered. The patient agrees to proceed. Interpreter was present for all discussions.   DESCRIPTION OF PROCEDURE: The patient is taken to special procedures and placed in the supine position. After adequate sedation is achieved, he is positioned supine and his left arm is extended palm upward. The left arm is prepped and draped in sterile fashion. Lidocaine 1% is infiltrated in the soft tissues and access to the fistula is obtained with [name] micropuncture needle, microwire followed by micro sheath, J-wire followed by 6-French sheath. Hand injection of contrast is then used to demonstrate the fistula as well as the central veins. With compression, the arterial anastomosis is evaluated as well. After review of the images, pursestring suture is placed. Sheath is removed. There are no immediate complications.   INTERPRETATION:  The fistula is of generous size, measuring 14 to 18 mm in diameter. In portions, the cephalic confluence appears to be widely patent, as are the subclavian, innominate and superior vena cava. Arterial anastomosis also appears to be widely patent and visualized portions of the brachial artery are patent.   SUMMARY: No hemodynamically significant stenosis identified. Fistula appears to be patent.   ____________________________ Renford DillsGregory G. Rilen Shukla, MD ggs:jm D: 06/09/2012 14:04:00 ET T: 06/09/2012 14:15:30 ET JOB#: [id number]  cc: Renford DillsGregory G. Candler Ginsberg, MD, <Dictator> Renford DillsGREGORY G Shyanne Mcclary MD ELECTRONICALLY SIGNED 06/23/2012 16:07

## 2014-04-28 NOTE — Discharge Summary (Signed)
PATIENT NAME:  Travis Travis [name], Travis Travis [name] MR#:  [id number] DATE OF BIRTH:  Dec 19, 1950  DATE OF ADMISSION:  10/01/2012 DATE OF DISCHARGE:  10/02/2012   For Travis [name] detailed note, please take Travis [name] look at the history and physical done on admission by Dr.  Elisabeth PigeonVachhani.   DIAGNOSES AT DISCHARGE: As follows: 1. Chest pain, likely due to chronic stable angina.  2. History of previous coronary disease.  3. Hypertension.  4. Diabetes.  5. End-stage renal disease, on hemodialysis.  6. Gastroesophageal reflux disease.   DIET: The patient is being discharged on Travis [name] low-sodium, low-fat, American Diabetic Association renal diet.   ACTIVITY: As tolerated.   FOLLOWUP: In the next 1 to 2 weeks with Dr. Adrian BlackwaterShaukat Khan, the patient's cardiologist.   DISCHARGE MEDICATIONS: 1. Plavix 75 mg daily. 2. Imdur 60 mg b.i.d. 3. Lantus 18 units at bedtime. 4. Crestor 40 mg daily.  5. Iron sulfate 325 mg t.i.d. 6. Calcium acetate 667 mg 1 tab daily. 7. Sensipar 30 mg daily.  8. Vitamin D3 50,000 international units weekly. 9. Ranexa 1000 mg b.i.d.   CONSULTANTS DURING THE HOSPITAL COURSE:  1. Laurier NancyShaukat Travis [name]. Khan, MD, from cardiology. 2. Mosetta PigeonHarmeet Singh, MD, from nephrology.   PERTINENT STUDIES DONE DURING THE HOSPITAL COURSE: Are as follows: Travis [name] chest x-ray done on admission showing prominence of the right pulmonary artery, but no other acute cardiopulmonary disease.   BRIEF HOSPITAL COURSE: This is Travis [name] 64 year old male, with medical problems as mentioned above, who presented to the hospital from dialysis due to acute chest pain and shortness of breath.   1. Chest pain. The patient was admitted to the hospital as his chest pain and shortness of breath were thought to be anginal equivalent as he had Travis [name] mildly elevated troponin and his symptoms were worrisome for angina. The patient was observed overnight on telemetry, had 3 sets of cardiac markers checked. His troponins did not trend upwards. They maintained at 0.09. He is currently chest  pain-free and hemodynamically stable. The patient was seen by cardiology by Dr. Adrian BlackwaterShaukat Khan, and as per him, the patient has diffuse coronary disease which is not amenable to any acute cardiac intervention. [facility], but at this point, his coronary disease is not amenable to that. He is therefore to be managed medically, with optimizing medical management. The patient therefore is being discharged on Ranexa along with his other cardiac medications, including the Plavix, aspirin and statin. The patient will have close followup with cardiology as an outpatient.  2. End-stage renal disease, on hemodialysis. The patient is on scheduled dialysis on Monday, Wednesday, Friday. He was seen by nephrology. He will resume his dialysis upon discharge.  3. Diabetes. The patient had no evidence of any hypoglycemic episodes. He was to continue his Lantus as stated.  4. Hypertension. The patient remained hemodynamically stable. He will continue his Imdur upon discharge.  5. Hyperlipidemia. The patient was maintained on his Crestor. He will resume that upon discharge too.   CODE STATUS: The patient is Travis [name] full code.   TIME SPENT ON DISCHARGE: 35 minutes.   ____________________________ Rolly PancakeVivek J. Cherlynn KaiserSainani, MD vjs:OSi D: 10/02/2012 13:43:09 ET T: 10/02/2012 14:08:28 ET JOB#: [id number]  cc: Rolly PancakeVivek J. Cherlynn KaiserSainani, MD, <Dictator> Laurier NancyShaukat Travis [name]. Khan, MD Houston SirenVIVEK J Rachael Zapanta MD ELECTRONICALLY SIGNED 10/13/2012 19:12

## 2014-04-29 NOTE — Op Note (Signed)
PATIENT NAME:  Travis [name], Travis [name] MR#:  [id number] DATE OF BIRTH:  July 02, 1950  DATE OF PROCEDURE:  07/12/2013  PREOPERATIVE DIAGNOSIS: Visually significant cataract of the right eye.   POSTOPERATIVE DIAGNOSIS: Visually significant cataract of the right eye.   OPERATIVE PROCEDURE: Cataract extraction by phacoemulsification with implant of intraocular lens to the right eye.   SURGEON: Galen ManilaWilliam Lofton Leon, MD  ANESTHESIA:  1. Managed anesthesia care.  2. 50-50 mixture of 0.75% bupivacaine and 4% Xylocaine given as [name] retrobulbar block.   COMPLICATIONS: None.   TECHNIQUE: stop and chop    DESCRIPTION OF PROCEDURE: The patient was examined and consented for this procedure in the preoperative holding area and then brought back to the Operating Room where the anesthesia team employed managed anesthesia care.  3.5 milliliters of the aforementioned mixture were placed in the right orbit on an Atkinson needle without complication. The right eye was then prepped and draped in the usual sterile ophthalmic fashion. [name] lid speculum was placed. The side-port blade was used to create [name] paracentesis and the anterior chamber was filled with viscoelastic. The keratome was used to create [name] near clear corneal incision. The continuous curvilinear capsulorrhexis was performed with [name] cystotome followed by the capsulorrhexis forceps. Hydrodissection and hydrodelineation were carried out with BSS on [name] blunt cannula. The lens was removed in [name] stop and chop technique. The remaining cortical material was removed with the irrigation-aspiration handpiece. The capsular bag was inflated with viscoelastic and the Alcon [id number] -diopter lens, serial number  was placed in the capsular bag without complication. The remaining viscoelastic was removed from the eye with the irrigation-aspiration handpiece. The wounds were hydrated. The anterior chamber was flushed with Miostat and the eye was inflated to [name] physiologic pressure. 0.1 mL of  cefuroxime concentration 10 mg/mL was placed in the anterior chamber. The wounds were found to be water tight. The eye was dressed with Vigamox followed by Maxitrol ointment and [name] protective shield was placed. The patient will followup with me in one day.   Please note that VisionBlue was used to stain the anterior capsule as this was an entirely white cataract. Also, [name] Malyugin ring was used to expand the pupil, as pupil was exceedingly miotic despite the usual dilating gel and additional 10% phenylephrine drops. Also note that [name] single 10-0 nylon suture was placed through the main incision at the end of the case.    ____________________________ Jerilee FieldWilliam L. Jazmyn Offner, MD wlp:cg D: 07/12/2013 22:40:00 ET T: 07/13/2013 01:47:40 ET JOB#: [id number]  cc: Raevon Broom L. Clay Solum, MD, <Dictator> Jerilee FieldWILLIAM L Harolyn Cocker MD ELECTRONICALLY SIGNED 08/03/2013 17:06

## 2014-04-30 NOTE — Op Note (Signed)
PATIENT NAME:  Travis [name], Akim A MR#:  [id number] DATE OF BIRTH:  Oct 09, 1950  DATE OF PROCEDURE:  06/04/2011  PREOPERATIVE DIAGNOSES:  1. End-stage renal disease.  2. Functional permanent access no longer need of his PermCath.   POSTOPERATIVE DIAGNOSES:  1. End-stage renal disease.  2. Functional permanent access no longer need of his PermCath.   PROCEDURE: Removal of right jugular PermCath.   SURGEON: Annice NeedyJason S. Yussuf Sawyers, MD   ANESTHESIA: Local.   ESTIMATED BLOOD LOSS: Minimal.   INDICATION FOR PROCEDURE: This is a 64 year old Hispanic male with end-stage renal disease. He now has functional access and his PermCath needs to be removed. Risks and benefits were discussed. Informed consent was obtained.   DESCRIPTION OF PROCEDURE: The patient was brought to the Vascular Interventional Radiology area. His existing catheter, right neck and chest were sterilely prepped and draped and a sterile surgical field was created. The area was copiously anesthetized with lidocaine. The catheter was then freed from the scar tissue surrounding the cuff with hemostats and an 11 blade. The catheter was then removed in its entirety including the cuff with gentle traction. Pressure was held. Sterile dressing was placed. The patient tolerated the procedure well.   ____________________________ Annice NeedyJason S. Kanitra Purifoy, MD jsd:drc D: 06/04/2011 08:36:01 ET T: 06/04/2011 11:20:06 ET JOB#: [id number]  cc: Annice NeedyJason S. Darol Cush, MD, <Dictator> Annice NeedyJASON S Rondal Vandevelde MD ELECTRONICALLY SIGNED 06/09/2011 14:00

## 2014-04-30 NOTE — H&P (Signed)
PATIENT NAME:  Travis [name], Hamp A MR#:  [id number] DATE OF BIRTH:  [birth year]  DATE OF ADMISSION:  03/01/2011  PRIMARY CARE PHYSICIAN: Hillery AldoSarah Patel, MD ([facility])  CARDIOLOGIST: Adrian BlackwaterShaukat Khan, MD  NEPHROLOGIST: Mosetta PigeonHarmeet Singh, MD  CHIEF COMPLAINT: Chest pain.   HISTORY OF PRESENT ILLNESS: [name] is a 64 year old Hispanic non-English-speaking male with a history of insulin-dependent diabetes mellitus, coronary artery disease, peripheral vascular disease, and end-stage kidney disease with recent initiation of dialysis in late November who presents with sudden onset of chest pain which began about five minutes after he began dialysis this afternoon. His chest pain was described as pressure and was accompanied by diaphoresis and nausea. He was given one nitroglycerin tablet sublingual in the dialysis Center and EMS was called. His blood pressure was initially normotensive, but became low at 80/54 after nitroglycerin and he was brought to the Emergency Room for further work-up. The patient states that he has had several episodes of chest discomfort this week, during prior dialysis on Monday and Wednesday, but did not come to the hospital. He relates these prior episodes as being restlessness with dyspnea and also have been occurring at home with mild exertion. However, up until this week, he says he was exercising at home, working in the yard, and not having any episodes of chest pain. He is followed by Dr. Welton FlakesKhan for cardiology and saw him less than a month ago.   PAST MEDICAL HISTORY:  1. End-stage kidney disease with anemia of chronic disease. Dialysis initiated 11/29. 2. Arteriovenous fistula placed recently in left antecubital fossa by Dr. Levora DredgeGregory Schnier.  Currently dialysis is being done through a right IJ dialysis catheter.  3. Coronary artery disease, status post CABG in March of either 2010 or 2011, [facility].  4. [facility] of [facility] for acute  myocardial infarction.  5. Insulin-dependent diabetes mellitus with medication noncompliance.  6. Hypertension.  7. Peripheral vascular disease.  8. 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  9. 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   MEDICATIONS:  1. Hydralazine 50 mg three times daily. 2. Lantus 30 units daily.  3. Vitamin D 50,000 units weekly. 4. Aspirin 81 mg daily.  5. Labetalol 200 mg three times daily. 6. Crestor 20 mg daily. 7. Iron sulfate 325 mg twice a day. 8. Lasix 80 mg daily. 9. Imdur 60 mg twice a day. 10. Plavix 75 mg daily. 11. Norco as needed for back pain. 12. PhosLo 667 mg twice a day. 13. Cinacalcet 30 mg daily.    PAST SURGICAL HISTORY:  1. [facility].  2. Right transmetatarsal amputation.  DRUG ALLERGIES: No known drug allergies.  SOCIAL HISTORY: He is a nonsmoker and nondrinker. No illicit drug use. He lives with his wife. He is disabled secondary to his diabetes and peripheral vascular disease.   FAMILY HISTORY: Noncontributory as his father is healthy and mother died of old age.   REVIEW OF SYSTEMS: CONSTITUTIONAL: Positive for weakness in the right leg secondary to stroke. HEENT: No recent changes in vision. No tinnitus, ear pain, or recent hearing loss. He denies sinus pain and difficulty swallowing. RESPIRATORY: Positive for dyspnea with exertion, but he denies painful respirations and productive cough. CARDIOVASCULAR: Positive for chest pain. He denies orthopnea and syncope. He does have hypertension and dyspnea with exertion. GASTROINTESTINAL:  He has had some nausea without vomiting or diarrhea over the last several days. No recent change in bowel habits. GENITOURINARY: Negative for dysuria, hematuria, or incontinence. He denies any recent penile sores or prostatitis. He does have diabetes, but denies polyuria,  nocturia, or thyroid problems. He does have some easy bruising and bleeding secondary to Plavix and aspirin. He denies any changes in skin or moles. He does have some low back pain secondary to degenerative disk disease. He denies any numbness or weakness, except in the right lower extremity. He denies anxiety or insomnia, but does voice symptoms of depression today due to his ongoing health problems.   PHYSICAL EXAMINATION:   GENERAL: This is a well nourished middle-aged male in no apparent distress.   VITALS: Initial set of vital signs in the Emergency Room included a temperature of 98 oral, pulse 84 and regular, respirations 18, blood pressure 161/90, and pulse oximetry 99% on room air.   EYES: Sclerae are anicteric, but somewhat injected.   NECK: Supple without lymphadenopathy, JVD, or thyromegaly. He does have a right IJ catheter which is covered with a sterile bandage currently.   LUNGS: Clear to auscultation bilaterally today with good breath sounds.   CARDIOVASCULAR: Regular rate and rhythm with a systolic murmur in the 3/6 range.   BREASTS: Nontender. There is no axillary adenopathy.   ABDOMEN: Soft, nontender, and nondistended with good bowel sounds. Several laparoscopy scars are noted as well as a CABG sternotomy scar.   MUSCULOSKELETAL:  5/5 muscle strength in all four extremities.   SKIN: Warm and dry without rashes or lesions.   LYMPH: There is no cervical, axillary, inguinal, or supraclavicular lymphadenopathy.   NEUROLOGIC: Intact cranial nerves. Intact deep tendon reflexes. Intact sensation.   PSYCH: He is alert and oriented to person, place, and time. He does seem somewhat depressed over his medical comorbidities and is tearful today.   ADMISSION LABS/STUDIES: Sodium 139, potassium 4.3, chloride 104, bicarbonate 24, BUN 29, creatinine 4.38, serum glucose 204. White count 4.4, hemoglobin 10.7, platelets 176. Hepatic panel normal except for an AST of 38 and an albumin of  3.1. Cardiac enzymes: CPK was 199, MB 2.8, and troponin was 0.15. BNP was  elevated at 25,955.   12-lead EKG was normal sinus rhythm with QT prolongation, incomplete left bundle, ST depression in inferior leads, and ST elevation in V2 only.   Review of EKG from December shows that the ST elevation in V2 is chronic, but the ST depression in leads II, III, and aVF appears to be new.   Portable chest x-ray shows stable cardiomegaly and stable mediastinum. There is duel lumen catheter with the tip overlying the SVC and there is interstitial pulmonary edema, right greater than left.   ASSESSMENT AND PLAN:  1. Chest pain: The patient has known coronary artery disease and has been experiencing chest pain for the last several dialysis episodes. Question whether the drops in blood pressure are causing acute ischemia. We will admit for serial cardiac enzymes and cardiology evaluation. His last cardiac catheter was over two years ago and given his history of uncontrolled diabetes it is quite possible that he now has obstructive disease again.  2. Congestive heart failure, diastolic, per recent echo showing normal LV function and hypertrophic cardiomyopathy: He will possibly need dialysis during this admission given his failure to complete dialysis today. He continues to make some urine and has use of Lasix at home. We will begin Lasix therapy and, if diuresis is not  achieved, he will likely need dialysis.  3. Diabetes mellitus, uncontrolled: We will resume his home dose of Lantus and use sliding scale with meals. Hemoglobin A1c will be checked.  4. End-stage kidney disease, secondary to uncontrolled diabetes with peripheral vascular disease and prior amputations of right foot metatarsal: We will resume home medications to manage the electrolyte derangements adherent with kidney disease and place the patient on renal diet. Dr. Thedore Mins is his nephrologist and he will be notified of admission.         5. Depression: The patient is exhibiting signs of depression secondary to his chronic comorbidities. He would benefit from a psychiatric evaluation while he is here or either as an outpatient and would benefit from starting an selective serotonin reuptake inhibitor.  ESTIMATED TIME OF CARE: 90 minutes.  ____________________________ Duncan Dull, MD tt:slb D: 03/01/2011 16:38:31 ET T: 03/02/2011 08:30:51 ET JOB#: [id number]  cc: Duncan Dull, MD, <Dictator> Duncan Dull MD ELECTRONICALLY SIGNED 03/21/2011 8:48

## 2014-04-30 NOTE — Consult Note (Signed)
    General Aspect Consult dicated, Known patient with h/o CAD, angina had episode of angina and braught to ER. This is chronic issue, advise d/c home with f/u office next week.    No Known Allergies:   Electronic Signatures: Radene KneeKhan, Ethaniel Garfield Ali (MD)  (Signed [phone]-Feb-13 15:47)  Authored: General Aspect/Present Illness, Allergies   Last Updated: 24-Feb-13 15:47 by Radene KneeKhan, Mahina Salatino Ali (MD)

## 2014-04-30 NOTE — Op Note (Signed)
PATIENT NAME:  Travis [name], Travis [name] MR#:  [id number] DATE OF BIRTH:  12-24-1950  DATE OF PROCEDURE:  04/16/2011  PREOPERATIVE DIAGNOSES:  1. Complication arteriovenous fistula with hematoma formation and inability to cannulate.  2. End-stage renal disease requiring hemodialysis.   POSTOPERATIVE DIAGNOSIS:   1. Complication arteriovenous fistula with hematoma formation and inability to cannulate.  2. End-stage renal disease requiring hemodialysis.   PROCEDURE PERFORMED:  1. Contrast injection left arm brachiocephalic fistula.  2. Percutaneous coil embolization of five separate and distinct side branches of the cephalic vein left arm arteriovenous fistula.  3. Percutaneous transluminal angioplasty of the arterial anastomosis to 8 mm.   SURGEON: Renford Dills, MD   SEDATION: Versed 4 mg plus fentanyl 150 mcg administered IV. Continuous ECG, pulse oximetry and cardiopulmonary monitoring was performed throughout the entire procedure by the Interventional Radiology nurse. Total sedation time was one hour.   ACCESS:  1. A 6-French sheath left arm brachiocephalic fistula, antegrade direction.  2. A 6-French sheath, retrograde direction, left arm brachiocephalic fistula.   CONTRAST USED: Isovue 55 mL.   FLUOROSCOPY TIME: 13.9 minutes.    INDICATIONS: Mr. Davalos is a 64 year old gentleman who is having increasing difficulty with his dialysis access left arm brachiocephalic fistula. They are now alternating cannulating with the catheter and the fistula. He has also experienced a hematoma of the arm as well. The risks and benefits for contrast injection and treatment were reviewed. All questions are answered. The patient has agreed to proceed.   DESCRIPTION OF PROCEDURE: The patient is taken to Special Procedures and placed in the supine position with his left arm extended palm upward, and after adequate sedation the left arm is prepped and draped in a sterile fashion. One percent lidocaine is  infiltrated in the soft tissues near the arterial anastomosis, and access to the fistula is obtained with a micropuncture needle, a microwire followed by micro sheath, J-wire followed by a 6-French. Hand injection of contrast is then used to demonstrate the more proximal fistula as well as the central veins. Once these are imaged, the arterial anastomosis is imaged with proximal compression and hand injection.   After reviewing these images, there are multiple fairly large 2 to 3 mm and greater side branches noted. There is also a high-grade stenosis at 75 to 80% just above the arterial anastomosis.   Three thousand units of heparin is given, and a floppy Glidewire and KMP catheter are then used to serially engage each of these larger side branches. A total of five separate and distinct branches were engaged. Each time a Glidewire was negotiated into a branch, the KMP catheter was then advanced. Hand injection of contrast with an image that was saved for the permanent record was then made to verify location within the branch as well as which branch had been engaged, and then subsequently coils were used to occlude the branch. A combination of 6 x 14 coils, as well as 5 x 5 coils, as well as 5 x 3 tornado coils were used, typically placing one to two 6 x 14s and at least two 5 mm diameter coils. Once all branches had been embolized from the antegrade sheath, 1% lidocaine was infiltrated in the soft tissues at the level of the deltoid; and access to the fistula was obtained with a micropuncture needle, a microwire followed by micro sheath, J-wire followed by a 6-French sheath. Again the Glidewire KMP catheter were used to engage the side branches first, which were embolized; a  total of three branches were embolized from the antegrade direction and two branches from the retrograde direction. Following completion of this portion, the wire was negotiated into the brachial artery and then down into the radial artery.  A KMP catheter was advanced, and the Glidewire was exchanged for a Magic Torque. An 8 x 4 Rival balloon was then advanced across the wire. Imaging with compression was obtained and the balloon was inflated, treating the stenosis previously noted. Follow-up angiography with reflux of contrast demonstrated complete resolution of  this stricture.   The wire and KMP catheter removed. Pursestring sutures of 4-0 Monocryl were placed around each sheath and the sheaths were removed without difficulty. There were no immediate complications.   INTERPRETATION: Initial images demonstrate the fistula at the cephalic vein proper measures anywhere from 8 to 14 mm in diameter; however, it does have multiple rather large side branches which appear to be siphoning blood from the fistula. The confluence with the subclavian vein appears to be widely patent, and the central venous anatomy is widely patent. Reflux of contrast into the arterial system demonstrates a very high-grade stenosis at the region of the arterial anastomosis. This is subsequently treated with an 8 mm dilatation. Follow-up angiography demonstrated a successful intervention with less than 5% residual stenosis.   SUMMARY: Successful salvage of left brachiocephalic fistula as described above.   ____________________________ Renford DillsGregory G. Schnier, MD ggs:cbb D: 04/16/2011 14:17:11 ET T: 04/16/2011 15:39:10 ET JOB#: [id number]  cc: Renford DillsGregory G. Schnier, MD, <Dictator> Munsoor Lizabeth LeydenN. Lateef, MD Mosetta PigeonHarmeet Singh, MD Renford DillsGREGORY G SCHNIER MD ELECTRONICALLY SIGNED 04/17/2011 14:28

## 2014-04-30 NOTE — Discharge Summary (Signed)
PATIENT NAME:  Leonie GreenGIRON, Blair A MR#:  [id number] DATE OF BIRTH:  06-11-1950  DATE OF ADMISSION:  03/01/2011 DATE OF DISCHARGE:  03/03/2011  ADMITTING PHYSICIAN: Dr. Duncan Dulleresa Tullo  PRIMARY CARE PHYSICIAN: Dr. Hillery AldoSarah Patel, [facility]  CARDIOLOGIST: Dr. Adrian BlackwaterShaukat Khan NEPHROLOGIST: Dr. Thedore MinsSingh  REASON FOR ADMISSION: Chest pain.   DISCHARGE DIAGNOSES:  1. Chest pain most likely secondary to demand ischemia.  2. End-stage renal disease with hemodialysis Tuesday, Thursday, Saturday.  3. Diabetes. 4. Peripheral vascular disease. 5. Hypertension. 6. Anemia of chronic kidney disease.   CONSULTANTS:  1. Dr. Thedore MinsSingh. 2. Dr. Adrian BlackwaterShaukat Khan. 3. Case management.   LABORATORY, DIAGNOSTIC AND RADIOLOGICAL DATA: Tests done during this hospitalization: Hemodialysis. Chest x-ray 03/01/2011 showed findings may represent asymmetric interstitial pulmonary edema. Chest x-ray 02/24 showed findings of pulmonary venous hypertension with frank pulmonary edema.   HOSPITAL COURSE: Initial history and physical were done by Dr. Darrick Huntsmanullo. Please refer to her note dated 03/01/2011 for complete details. In brief, this is a 64 year old Hispanic male with history of insulin-dependent diabetes, coronary artery disease, peripheral vascular disease, chronic kidney disease stage V who started dialysis November 2012 who began dialysis for about five minutes, he then developed chest pain. He was given nitroglycerin sublingual and EMS was called. His blood pressure was low, 80/54 after nitroglycerin. Admitted to the hospitalist service.  1. For his chest pain he had three sets of cardiac enzymes which showed minimal elevation of his troponin which was thought to be due to chronic kidney disease. He was seen by Dr. Adrian BlackwaterShaukat Khan who recommended no additional testing. In discussion with him he thought that patient did not appear volume overloaded despite interstitial edema. He may have actually been dry causing the demand ischemia. Patient  on discharge was chest pain free. Dr. Welton FlakesKhan recommended follow up with him as outpatient.  2. Chronic kidney disease stage V, hemodialysis Tuesday, Thursday, Saturday. Patient was dialyzed by Dr. Thedore MinsSingh. Chest x-ray showed pulmonary discharge. He had his last dialysis on 03/01/2011. He is getting the hemodialysis through right IJ Perm-A-Cath AV fistula, currently not mature.  3. Diabetes with prior osteomyelitis secondary to peripheral artery disease with lower extremity amputation. Hemoglobin A1c 6.7. Patient was on sliding scale insulin.  4. Hypertension. Patient was continued on Imdur, metoprolol, Lasix, hydralazine.   5. Anemia of chronic disease. Patient gets Procrit with dialysis. His hemoglobin and hematocrit were stable.  6. GI prophylaxis, Prilosec. DVT prophylaxis with heparin.   PHYSICAL EXAMINATION: Patient was discharged on 03/03/2011. VITAL SIGNS: Temperature 97.6, heart rate 63, respiratory rate 18, blood pressure 122/76, sating 95% on room air. LUNGS: Clear to auscultation. CARDIOVASCULAR: Regular rate and rhythm. ABDOMEN: Benign.   DISCHARGE MEDICATIONS: Patient was discharged on the following medications: 1. Aspirin 81 mg daily.  2. Lantus 30 units at bedtime. 3. Nitro-Dur 0.4 mg sublingual as needed.  4. Vitamin D2 50,000 international units 1 capsule once a week.  5. Lasix 80 mg daily.  6. Hydralazine 50 mg t.i.d.  7. Plavix 75 mg daily.  8. Isosorbide mononitrate 1 tablet orally two times a day.  9. Sensipar 30 mg daily.  10. Labetalol 200 mg t.i.d.  11. Sensipar 30 mg once a day. 12. PhosLo gel caps 667 mg 2 capsules t.i.d. with meals, 1 capsule with snacks.  13. Crestor 20 mg daily.  14. Iron sulfate 325 mg twice daily.  15. Norco 5/325, 1 tablet p.o. q.6 hours p.r.n. back pain.   DIET: ADA renal diet.   ACTIVITY:  As tolerated.   FOLLOW UP: Patient is to see Dr. Hillery Aldo this week, Dr. Adrian Blackwater next week and continue hemodialysis Tuesday, Thursday, Saturday  with Dr. Thedore Mins.   CODE STATUS: FULL CODE.   TOTAL TIME SPENT ON DISCHARGE: 45 minutes.   Thank you for allowing me to participate in the care of this patient.   ____________________________ Corie Chiquito. Lafayette Dragon, MD aaf:cms D: 03/03/2011 20:44:00 ET T: 03/04/2011 13:52:37 ET JOB#: [id number]  cc: Karolee Ohs A. Lafayette Dragon, MD, <Dictator> Sarah "Sallie" Allena Katz, MD Mosetta Pigeon, MD Laurier Nancy, MD Karolee Ohs Laverda Page MD ELECTRONICALLY SIGNED 03/10/2011 13:38

## 2014-04-30 NOTE — Consult Note (Signed)
PATIENT NAME:  Travis Travis [name], Travis Travis [name] MR#:  [id number] DATE OF BIRTH:  1950/08/21  DATE OF CONSULTATION:  03/02/2011  REFERRING PHYSICIAN:   CONSULTING PHYSICIAN:  Travis Travis [name]. Pike Scantlebury, MD  INDICATION FOR CONSULTATION: Chest pain.   HISTORY OF PRESENT ILLNESS: This is Travis [name] 64 year old Hispanic male who speaks Travis [name] little bit of English who came in with chest pain. Thus I was asked to evaluate the patient. According to the patient, he was at his dialysis center where he had an episode of chest pain and was given nitroglycerin by the nurse. After the nitroglycerin, he felt Travis [name] little diaphoretic and short of breath, and blood pressure dropped to 80/54. The nurse kind of panicked and sent him by ambulance to the hospital. He has Travis [name] history of having angina. This  has been an ongoing chronic situation. He has been seen in the office as recently as last week. He has been relatively stable. He has occasional chest pain, but gets relieved by nitroglycerin.   PAST MEDICAL HISTORY:  1. History of end-stage renal disease, on dialysis. 2. History of AV fistula. 3. History of [facility] admission in March 2012 for acute myocardial infarction. 4. Insulin-dependent diabetes.  5. Hypertension.  6. Peripheral vascular disease. 7. History of cerebrovascular accident.   MEDICATIONS:  1. Imdur 60 mg. 2. Plavix 75 mg.  3. Lasix 80 mg.  4. Crestor 20 mg.  5. Aspirin 81 mg.  6. Labetalol 200 mg three times daily. 7. Hydralazine 50 mg three times daily.  PHYSICAL EXAMINATION:   GENERAL: He is alert and oriented x3, in no acute distress right now. He denies any chest pain.   NECK: No JVD.   LUNGS: Clear.   HEART: Regular rate and rhythm. Normal S1 and S2. No audible murmur.   ABDOMEN: Soft and nontender.   EXTREMITIES: No pedal edema.   LABS/STUDIES: EKG shows normal sinus rhythm, poor R wave progression, nonspecific ST-T changes.   Troponin was 0.15. His hemoglobin is 10.   IMPRESSION:  1. Stable angina.  2. Mildly  elevated troponin secondary to renal failure. This is chronic. 3. History of coronary artery disease. 4. History of congestive heart failure. 5. History of end-stage renal disease.  RECOMMENDATIONS: Clinically he is stable, I think this is ongoing chronic angina. I advised discharging the patient in the Travis [name].m. We will follow with him in the office.   Thank you very much for the referral. ____________________________ Travis Travis [name]. Travis Bowmer, MD sak:slb D: 03/02/2011 15:46:14 ET     T: 03/02/2011 16:08:43 ET        JOB#: [id number] cc: Travis Travis [name]. Daya Dutt, MD, <Dictator> Travis Travis [name] Primrose Oler MD ELECTRONICALLY SIGNED 03/04/2011 15:55

## 2014-04-30 NOTE — Op Note (Signed)
PATIENT NAME:  Travis [name], Travis [name] MR#:  [id number] DATE OF BIRTH:  04/17/1950  DATE OF PROCEDURE:  02/14/2011  PREOPERATIVE DIAGNOSIS:  Endstage renal disease requiring hemodialysis.   POSTOPERATIVE DIAGNOSIS: Endstage renal disease requiring hemodialysis.   PROCEDURE: Creation of [name] brachiocephalic fistula.   SURGEON: Levora DredgeGregory Schnier, MD  ANESTHESIA: General by LMA.   FLUIDS: Per anesthesia record.   ESTIMATED BLOOD LOSS: Minimal.   SPECIMEN: None.   INDICATIONS: [name] is [name] who has now progressed to endstage renal disease and has been started on hemodialysis. He is, therefore, undergoing creation of [name] permanent access in his left arm. Risks and benefits were reviewed. All questions are answered. The patient agrees to proceed.   DESCRIPTION OF PROCEDURE: The patient is taken to the operating room and placed in the supine position. After adequate general anesthesia is induced, he is positioned with his left arm extended palm upward. The left arm was prepped and draped in sterile fashion.   [name] curvilinear incision is created in the antecubital fossa and the dissection is carried down to expose the brachial artery, which is looped proximally and distally with Silastic vessel loops. The meeting cuboidal crossing vein is then identified. It appears to be quite nice and it is dissected proximally and distally in [name] circumferential fashion. Side branches are ligated with 3-0 silk ties. It was then marked with [name] surgical marker, transected distally and dilated with Gerre PebblesGarrett coronary dilators to 4 mm in diameter. It was irrigated with heparinized saline and clamped. The brachial artery was then controlled proximally and distally, arteriotomy made with [name] #11 blade and extended with an aortic punch, and an end-vein-to-side- brachial artery anastomosis was fashioned using running 6-0 Prolene. Flushing maneuvers were performed and flow was established through the fistula.  Excellent thrill was appreciated. Small bleeding spot was controlled with [name] single Prolene suture. Surgicel was placed along the suture line. The wound was irrigated with 100 mL of saline and the wound was then closed in layers using 3-0 Vicryl followed by 4-0 Monocryl subcuticular and Dermabond. The patient tolerated the procedure well and there were no immediate complications. Sponge and needle counts were correct. He was taken to the recovery area in excellent condition.   ____________________________ Renford DillsGregory G. Schnier, MD ggs:bjt D: 02/14/2011 13:39:20 ET T: 02/14/2011 13:50:59 ET JOB#: [id number]  cc: Renford DillsGregory G. Schnier, MD, <Dictator> Munsoor Lizabeth LeydenN. Lateef, MD Renford DillsGREGORY G SCHNIER MD ELECTRONICALLY SIGNED 03/03/2011 10:33

## 2014-04-30 NOTE — Op Note (Signed)
PATIENT NAME:  Leonie GreenGIRON, Diyan A MR#:  [id number] DATE OF BIRTH:  July 16, 1950  DATE OF PROCEDURE:  01/03/2011  PREOPERATIVE DIAGNOSES:  1. End-stage renal disease.  2. Clotted hemodialysis catheter.   POSTOPERATIVE DIAGNOSES: 1. End-stage renal disease.  2. Clotted hemodialysis catheter.   PROCEDURE: Continuous infusion of TPA to dialysis catheter.  SURGEON: Annice NeedyJason S. Birtie Fellman, MD   ANESTHESIA: None.    ESTIMATED BLOOD LOSS: None.   INDICATION FOR PROCEDURE: This is a 64 year old Hispanic male who had not had dialysis for approximately five days. When he went to his dialysis center today, his catheter was clotted. He is brought in for a TPA infusion to reopen the catheter.   DESCRIPTION OF PROCEDURE: The patient was brought into the hospital. A continuous infusion of TPA of 2 mg TPA infused over two hours was performed to each lumen. At the conclusion of the infusion, both lumens withdrew blood well of nonclotted blood and flushed easily with sterile saline. Both lumens were then packed with concentrated heparin and new caps were placed.   ____________________________ Annice NeedyJason S. Yahia Bottger, MD jsd:drc D: 01/04/2011 11:02:36 ET T: 01/04/2011 12:16:26 ET JOB#: [id number]  cc: Annice NeedyJason S. Joannie Medine, MD, <Dictator> Annice NeedyJASON S Akire Rennert MD ELECTRONICALLY SIGNED 01/24/2011 7:55

## 2015-02-21 ENCOUNTER — Emergency Department: Payer: Medicare Other

## 2015-02-21 ENCOUNTER — Inpatient Hospital Stay
Admission: EM | Admit: 2015-02-21 | Discharge: 2015-02-22 | DRG: 280 | Disposition: A | Discharge status: Home / Self Care | Payer: Medicare Other | Attending: Internal Medicine | Admitting: Internal Medicine

## 2015-02-21 ENCOUNTER — Encounter: Payer: Self-pay | Admitting: Emergency Medicine

## 2015-02-21 DIAGNOSIS — Z87891 Personal history of nicotine dependence: Secondary | ICD-10-CM | POA: Diagnosis not present

## 2015-02-21 DIAGNOSIS — Z992 Dependence on renal dialysis: Secondary | ICD-10-CM

## 2015-02-21 DIAGNOSIS — N179 Acute kidney failure, unspecified: Secondary | ICD-10-CM

## 2015-02-21 DIAGNOSIS — I214 Non-ST elevation (NSTEMI) myocardial infarction: Secondary | ICD-10-CM | POA: Diagnosis present

## 2015-02-21 DIAGNOSIS — Z951 Presence of aortocoronary bypass graft: Secondary | ICD-10-CM

## 2015-02-21 DIAGNOSIS — I120 Hypertensive chronic kidney disease with stage 5 chronic kidney disease or end stage renal disease: Secondary | ICD-10-CM | POA: Diagnosis present

## 2015-02-21 DIAGNOSIS — I2510 Atherosclerotic heart disease of native coronary artery without angina pectoris: Secondary | ICD-10-CM | POA: Diagnosis present

## 2015-02-21 DIAGNOSIS — Z794 Long term (current) use of insulin: Secondary | ICD-10-CM

## 2015-02-21 DIAGNOSIS — E1122 Type 2 diabetes mellitus with diabetic chronic kidney disease: Secondary | ICD-10-CM | POA: Diagnosis present

## 2015-02-21 DIAGNOSIS — N289 Disorder of kidney and ureter, unspecified: Secondary | ICD-10-CM | POA: Diagnosis present

## 2015-02-21 DIAGNOSIS — D631 Anemia in chronic kidney disease: Secondary | ICD-10-CM | POA: Diagnosis present

## 2015-02-21 DIAGNOSIS — Z79899 Other long term (current) drug therapy: Secondary | ICD-10-CM

## 2015-02-21 DIAGNOSIS — E785 Hyperlipidemia, unspecified: Secondary | ICD-10-CM | POA: Diagnosis present

## 2015-02-21 DIAGNOSIS — I509 Heart failure, unspecified: Secondary | ICD-10-CM | POA: Diagnosis present

## 2015-02-21 DIAGNOSIS — N186 End stage renal disease: Secondary | ICD-10-CM | POA: Diagnosis present

## 2015-02-21 DIAGNOSIS — I200 Unstable angina: Secondary | ICD-10-CM | POA: Diagnosis present

## 2015-02-21 DIAGNOSIS — Z7982 Long term (current) use of aspirin: Secondary | ICD-10-CM | POA: Diagnosis not present

## 2015-02-21 DIAGNOSIS — Z9889 Other specified postprocedural states: Secondary | ICD-10-CM

## 2015-02-21 DIAGNOSIS — N2581 Secondary hyperparathyroidism of renal origin: Secondary | ICD-10-CM | POA: Diagnosis present

## 2015-02-21 HISTORY — DX: Atherosclerotic heart disease of native coronary artery without angina pectoris: I25.10

## 2015-02-21 HISTORY — DX: Heart failure, unspecified: I50.9

## 2015-02-21 HISTORY — DX: Dependence on renal dialysis: Z99.2

## 2015-02-21 LAB — BASIC METABOLIC PANEL
Anion gap: 13 (ref 5–15)
BUN: 42 mg/dL — AB (ref 6–20)
CHLORIDE: 100 mmol/L — AB (ref 101–111)
CO2: 24 mmol/L (ref 22–32)
CREATININE: 8.17 mg/dL — AB (ref 0.61–1.24)
Calcium: 7.4 mg/dL — ABNORMAL LOW (ref 8.9–10.3)
GFR calc Af Amer: 7 mL/min — ABNORMAL LOW (ref >60)
GFR, EST NON AFRICAN AMERICAN: 6 mL/min — AB (ref >60)
GLUCOSE: 75 mg/dL (ref 65–99)
POTASSIUM: 4.7 mmol/L (ref 3.5–5.1)
SODIUM: 137 mmol/L (ref 135–145)

## 2015-02-21 LAB — PROTIME-INR
INR: 1.32
Prothrombin Time: 16.5 seconds — ABNORMAL HIGH (ref 11.4–15.0)

## 2015-02-21 LAB — GLUCOSE, CAPILLARY
GLUCOSE-CAPILLARY: 136 mg/dL — AB (ref 65–99)
GLUCOSE-CAPILLARY: 78 mg/dL (ref 65–99)
GLUCOSE-CAPILLARY: 105 mg/dL — AB (ref 65–99)
Glucose-Capillary: 60 mg/dL — ABNORMAL LOW (ref 65–99)
Glucose-Capillary: 128 mg/dL — ABNORMAL HIGH (ref 65–99)

## 2015-02-21 LAB — APTT: APTT: 34 s (ref 24–36)

## 2015-02-21 LAB — CBC
HEMATOCRIT: 32.5 % — AB (ref 40.0–52.0)
Hemoglobin: 10.7 g/dL — ABNORMAL LOW (ref 13.0–18.0)
MCH: 34.5 pg — ABNORMAL HIGH (ref 26.0–34.0)
MCHC: 33 g/dL (ref 32.0–36.0)
MCV: 104.6 fL — AB (ref 80.0–100.0)
PLATELETS: 113 10*3/uL — AB (ref 150–440)
RBC: 3.11 MIL/uL — ABNORMAL LOW (ref 4.40–5.90)
RDW: 16.1 % — AB (ref 11.5–14.5)
WBC: 3.1 10*3/uL — AB (ref 3.8–10.6)

## 2015-02-21 LAB — TROPONIN I: Troponin I: 0.09 ng/mL — ABNORMAL HIGH (ref <0.031)

## 2015-02-21 LAB — BRAIN NATRIURETIC PEPTIDE: B Natriuretic Peptide: 2503 pg/mL — ABNORMAL HIGH (ref 0.0–100.0)

## 2015-02-21 LAB — HEPARIN LEVEL (UNFRACTIONATED): HEPARIN UNFRACTIONATED: 0.4 [IU]/mL (ref 0.30–0.70)

## 2015-02-21 MED ORDER — DOCUSATE SODIUM 100 MG PO CAPS
100.0000 mg | ORAL_CAPSULE | Freq: Two times a day (BID) | ORAL | Status: DC
  Administered 2015-02-21 (×2): 100 mg via ORAL
  Filled 2015-02-21 (×3): qty 1

## 2015-02-21 MED ORDER — HYDROCODONE-ACETAMINOPHEN 5-325 MG PO TABS: 1.0000 | ORAL_TABLET | ORAL | Status: DC | PRN

## 2015-02-21 MED ORDER — ONDANSETRON HCL 4 MG/2ML IJ SOLN: 4.0000 mg | Freq: Four times a day (QID) | INTRAMUSCULAR | Status: DC | PRN

## 2015-02-21 MED ORDER — ACETAMINOPHEN 325 MG PO TABS
650.0000 mg | ORAL_TABLET | Freq: Four times a day (QID) | ORAL | Status: DC | PRN
Start: 2015-02-21 — End: 2015-02-22

## 2015-02-21 MED ORDER — INSULIN ASPART 100 UNIT/ML ~~LOC~~ SOLN
0.0000 [IU] | Freq: Three times a day (TID) | SUBCUTANEOUS | Status: DC
  Administered 2015-02-21: 1 [IU] via SUBCUTANEOUS
  Filled 2015-02-21: qty 1

## 2015-02-21 MED ORDER — ISOSORBIDE DINITRATE 10 MG PO TABS
30.0000 mg | ORAL_TABLET | Freq: Three times a day (TID) | ORAL | Status: DC
  Administered 2015-02-21 (×2): 30 mg via ORAL
  Filled 2015-02-21 (×2): qty 3
  Filled 2015-02-21: qty 1
  Filled 2015-02-21: qty 3
  Filled 2015-02-21: qty 1

## 2015-02-21 MED ORDER — CLOPIDOGREL BISULFATE 75 MG PO TABS
75.0000 mg | ORAL_TABLET | Freq: Every day | ORAL | Status: DC
  Administered 2015-02-21: 75 mg via ORAL
  Filled 2015-02-21 (×2): qty 1

## 2015-02-21 MED ORDER — ROSUVASTATIN CALCIUM 20 MG PO TABS
40.0000 mg | ORAL_TABLET | Freq: Every day | ORAL | Status: DC
  Administered 2015-02-21: 40 mg via ORAL
  Filled 2015-02-21 (×3): qty 2

## 2015-02-21 MED ORDER — CARVEDILOL 3.125 MG PO TABS
3.1250 mg | ORAL_TABLET | Freq: Two times a day (BID) | ORAL | Status: DC
  Administered 2015-02-21: 3.125 mg via ORAL
  Filled 2015-02-21 (×2): qty 1

## 2015-02-21 MED ORDER — INSULIN GLARGINE 100 UNIT/ML SOLOSTAR PEN: 5.0000 [IU] | PEN_INJECTOR | Freq: Every day | SUBCUTANEOUS | Status: DC

## 2015-02-21 MED ORDER — CINACALCET HCL 30 MG PO TABS
90.0000 mg | ORAL_TABLET | Freq: Two times a day (BID) | ORAL | Status: DC
  Administered 2015-02-21: 90 mg via ORAL
  Filled 2015-02-21 (×4): qty 3

## 2015-02-21 MED ORDER — INSULIN ASPART 100 UNIT/ML ~~LOC~~ SOLN: 0.0000 [IU] | Freq: Every day | SUBCUTANEOUS | Status: DC

## 2015-02-21 MED ORDER — HEPARIN BOLUS VIA INFUSION
3500.0000 [IU] | Freq: Once | INTRAVENOUS | Status: AC
  Administered 2015-02-21: 3500 [IU] via INTRAVENOUS
  Filled 2015-02-21: qty 3500

## 2015-02-21 MED ORDER — ALUM & MAG HYDROXIDE-SIMETH 200-200-20 MG/5ML PO SUSP: 30.0000 mL | Freq: Four times a day (QID) | ORAL | Status: DC | PRN

## 2015-02-21 MED ORDER — DEXTROSE 50 % IV SOLN
INTRAVENOUS | Status: AC
  Administered 2015-02-21: 50 mL via INTRAVENOUS
  Filled 2015-02-21: qty 50

## 2015-02-21 MED ORDER — DEXTROSE 50 % IV SOLN
1.0000 | Freq: Once | INTRAVENOUS | Status: AC
  Administered 2015-02-21: 50 mL via INTRAVENOUS

## 2015-02-21 MED ORDER — HEPARIN (PORCINE) IN NACL 100-0.45 UNIT/ML-% IJ SOLN
1100.0000 [IU]/h | INTRAMUSCULAR | Status: DC
  Administered 2015-02-21: 1100 [IU]/h via INTRAVENOUS
  Filled 2015-02-21 (×3): qty 250

## 2015-02-21 MED ORDER — ASPIRIN EC 81 MG PO TBEC
81.0000 mg | DELAYED_RELEASE_TABLET | Freq: Every day | ORAL | Status: DC
  Administered 2015-02-21: 81 mg via ORAL
  Filled 2015-02-21 (×2): qty 1

## 2015-02-21 MED ORDER — SODIUM CHLORIDE 0.9% FLUSH
3.0000 mL | Freq: Two times a day (BID) | INTRAVENOUS | Status: DC
  Administered 2015-02-21: 3 mL via INTRAVENOUS

## 2015-02-21 MED ORDER — NITROGLYCERIN 0.4 MG/SPRAY TL SOLN: 1.0000 | Status: DC | PRN

## 2015-02-21 MED ORDER — PANTOPRAZOLE SODIUM 40 MG PO TBEC
40.0000 mg | DELAYED_RELEASE_TABLET | Freq: Every day | ORAL | Status: DC
  Administered 2015-02-21: 40 mg via ORAL
  Filled 2015-02-21 (×2): qty 1

## 2015-02-21 MED ORDER — SENNOSIDES-DOCUSATE SODIUM 8.6-50 MG PO TABS: 1.0000 | ORAL_TABLET | Freq: Every evening | ORAL | Status: DC | PRN

## 2015-02-21 MED ORDER — INSULIN GLARGINE 100 UNIT/ML ~~LOC~~ SOLN
5.0000 [IU] | Freq: Every day | SUBCUTANEOUS | Status: DC
Start: 2015-02-21 — End: 2015-02-22
  Administered 2015-02-21: 5 [IU] via SUBCUTANEOUS
  Filled 2015-02-21 (×2): qty 0.05

## 2015-02-21 MED ORDER — ONDANSETRON HCL 4 MG PO TABS: 4.0000 mg | ORAL_TABLET | Freq: Four times a day (QID) | ORAL | Status: DC | PRN

## 2015-02-21 MED ORDER — MORPHINE SULFATE (PF) 2 MG/ML IV SOLN: 1.0000 mg | INTRAVENOUS | Status: DC | PRN

## 2015-02-21 MED ORDER — NITROGLYCERIN 0.4 MG SL SUBL: 0.4000 mg | SUBLINGUAL_TABLET | SUBLINGUAL | Status: DC | PRN

## 2015-02-21 MED ORDER — VITAMIN D (ERGOCALCIFEROL) 1.25 MG (50000 UNIT) PO CAPS
50000.0000 [IU] | ORAL_CAPSULE | ORAL | Status: DC
Start: 2015-02-21 — End: 2015-02-22
  Administered 2015-02-21: 50000 [IU] via ORAL
  Filled 2015-02-21 (×2): qty 1

## 2015-02-21 MED ORDER — ACETAMINOPHEN 650 MG RE SUPP: 650.0000 mg | Freq: Four times a day (QID) | RECTAL | Status: DC | PRN

## 2015-02-21 NOTE — ED Notes (Signed)
Diet released, dining services called, will bring food shortly.

## 2015-02-21 NOTE — ED Notes (Addendum)
Patient presents to the ED with chest pain that began 4 hours ago.  Patient called EMS when pain started but patient refused to be transported to the hospital.  Patient was scheduled for dialysis today and went to [facility] for dialysis but when he presented there and notified them of his chest pain they called EMS.  Patient states, through a hospital spanish interpreter, "It's not pain, it's agitation.  I feel that my heart is working too hard."

## 2015-02-21 NOTE — ED Notes (Signed)
Spoke with Dr. Nemiah Commander who is covering for Dr. Juliene Pina.  She will speak with patient soon.  Patient eating at the moment.

## 2015-02-21 NOTE — Progress Notes (Addendum)
ANTICOAGULATION CONSULT NOTE - Initial Consult  Pharmacy Consult for heparin drip dosing and monitoring Indication: chest pain/ACS  No Known Allergies  Patient Measurements: Height:  (167.6 cm) Weight: 190 lb 14.4 oz (86.592 kg) IBW/kg (Calculated) : 63.8 Heparin Dosing Weight: 86.6 kg  Vital Signs: Temp: 98.2 F (36.8 C) (02/15 2020) Temp Source: Oral (02/15 2020) BP: 105/54 mmHg (02/15 2020) Pulse Rate: 53 (02/15 2020)  Labs:  Recent Labs  02/21/15 0746 02/21/15 2255  HGB 10.7*  --   HCT 32.5*  --   PLT 113*  --   APTT 34  --   LABPROT 16.5*  --   INR 1.32  --   HEPARINUNFRC  --  0.40  CREATININE 8.17*  --   TROPONINI 0.09*  --     Estimated Creatinine Clearance: 9.3 mL/min (by C-G formula based on Cr of 8.17).   Medical History: Past Medical History  Diagnosis Date  . Chronic kidney disease   . Diabetes mellitus without complication (HCC)   . Dialysis patient (HCC)   . Coronary artery disease   . CHF (congestive heart failure) (HCC)    Assessment: Pharmacy consulted to dose and monitor heparin drip in this 65 year old male with a history of CAD and ESRD requiring HD who presents complaining of chest pain. Patient was not taking any anticoagulants prior to admission and troponin on admission was 0.09.  Baseline labs: Hgb 10.7 Plt 113  APTT and INR ordered  Heparin dosing weight = 86.6 kg  Goal of Therapy:  Heparin level 0.3-0.7 units/ml Monitor platelets by anticoagulation protocol: Yes   Plan:  First heparin level therapeutic. Continue current rate. Will recheck heparin level in 8 hours.   Carola Frost, Pharm.D., BCPS Clinical Pharmacist 02/21/2015,11:59 PM

## 2015-02-21 NOTE — H&P (Signed)
[facility] [place] [facility]   PATIENT NAME: Travis [name]    MR#:  [id number]  DATE OF BIRTH:  1950/10/06  DATE OF ADMISSION:  02/21/2015  PRIMARY CARE PHYSICIAN: No primary care provider on file.   REQUESTING/REFERRING PHYSICIAN: dr Sharma Covert  CHIEF COMPLAINT:   Chest pain  HISTORY OF PRESENT ILLNESS:  Travis [name]  is a 65 y.o. male with a known history of CAD, and stage renal disease on hemodialysis who presents with above complaint. Patient reports he woke up this morning with a feeling of chest pressure associated with nausea and diaphoresis. He took a dose of isosorbide dinitrate and symptoms resolved. Again around 4 AM he had similar symptoms and took another isosorbide dinitrate. Patient called EMS when the pain started but refused to be transferred to the hospital. Patient went to [facility] for dialysis but when he presented there and notified of his chest pain EMS was called. He is currently chest pain-free.  PAST MEDICAL HISTORY:   Past Medical History  Diagnosis Date  . Chronic kidney disease   . Diabetes mellitus without complication (HCC)   . Dialysis patient (HCC)   . Coronary artery disease   . CHF (congestive heart failure) (HCC)     PAST SURGICAL HISTORY:   Past Surgical History  Procedure Laterality Date  . Foot surgery Right   . Arterial thrombectomy    . Cardiac surgery    . Vascular surgery      SOCIAL HISTORY:   Social History  Substance Use Topics  . Smoking status: Former Games developer  . Smokeless tobacco: Never Used  . Alcohol Use: No    FAMILY HISTORY:  No family history on file.  DRUG ALLERGIES:  No Known Allergies   REVIEW OF SYSTEMS:  CONSTITUTIONAL: No fever, fatigue or weakness.  EYES: No blurred or double vision.  EARS, NOSE, AND THROAT: No tinnitus or ear pain.  RESPIRATORY: No cough, shortness of breath, wheezing or hemoptysis.  CARDIOVASCULAR: Ositos for chest pain earlier, no orthopnea, edema.   GASTROINTESTINAL: No nausea, vomiting, diarrhea or abdominal pain.  GENITOURINARY: No dysuria, hematuria.  ENDOCRINE: No polyuria, nocturia,  HEMATOLOGY: No anemia, easy bruising or bleeding SKIN: No rash or lesion. MUSCULOSKELETAL: No joint pain or arthritis.   NEUROLOGIC: No tingling, numbness, weakness.  PSYCHIATRY: No anxiety or depression.   MEDICATIONS AT HOME:   Prior to Admission medications   Medication Sig Start Date End Date Taking? Authorizing Provider  aspirin EC 81 MG tablet Take 81 mg by mouth daily.   Yes Historical Provider, MD  clopidogrel (PLAVIX) 75 MG tablet Take 1 tablet by mouth daily. 02/08/15  Yes Historical Provider, MD  docusate sodium (COLACE) 50 MG capsule Take 100 mg by mouth 2 (two) times daily.   Yes Historical Provider, MD  isosorbide dinitrate (ISORDIL) 30 MG tablet Take 1 tablet by mouth 3 (three) times daily. 02/08/15  Yes Historical Provider, MD  LANTUS SOLOSTAR 100 UNIT/ML Solostar Pen Inject 18 Units into the skin daily at 10 pm.  02/09/15  Yes Historical Provider, MD  NITROSTAT 0.4 MG SL tablet Place 0.4 mg under the tongue every 5 (five) minutes as needed.  12/14/14  Yes Historical Provider, MD  pantoprazole (PROTONIX) 40 MG tablet Take 40 mg by mouth daily.   Yes Historical Provider, MD  rosuvastatin (CRESTOR) 40 MG tablet Take 1 tablet by mouth daily. 02/08/15  Yes Historical Provider, MD  SENSIPAR 90 MG tablet Take 1 tablet by mouth 2 (  two) times daily. 02/08/15  Yes Historical Provider, MD  Vitamin D, Ergocalciferol, (DRISDOL) 50000 units CAPS capsule Take 50,000 Units by mouth every 7 (seven) days.   Yes Historical Provider, MD      VITAL SIGNS:  Blood pressure 153/78, pulse 65, temperature 97.6 F (36.4 C), temperature source Oral, resp. rate 18, height 5' 7.72" (1.72 m), weight 91.1 kg (200 lb 13.4 oz), SpO2 100 %.  PHYSICAL EXAMINATION:  GENERAL:  65 y.o.-year-old patient lying in the bed with no acute distress.  EYES: Pupils equal, round,  reactive to light and accommodation. No scleral icterus. Extraocular muscles intact.  HEENT: Head atraumatic, normocephalic. Oropharynx and nasopharynx clear.  NECK:  Supple, no jugular venous distention. No thyroid enlargement, no tenderness.  LUNGS: Normal breath sounds bilaterally, no wheezing, rales,rhonchi or crepitation. No use of accessory muscles of respiration.  CARDIOVASCULAR: S1, S2 normal. No murmurs, rubs, or gallops.  ABDOMEN: Soft, nontender, nondistended. Bowel sounds present. No organomegaly or mass.  EXTREMITIES: No pedal edema, cyanosis, or clubbing.  NEUROLOGIC: Cranial nerves II through XII are grossly intact. No focal deficits. PSYCHIATRIC: The patient is alert and oriented x 3.  SKIN: No obvious rash, lesion, or ulcer.   LABORATORY PANEL:   CBC  Recent Labs Lab 02/21/15 0746  WBC 3.1*  HGB 10.7*  HCT 32.5*  PLT 113*   ------------------------------------------------------------------------------------------------------------------  Chemistries   Recent Labs Lab 02/21/15 0746  NA 137  K 4.7  CL 100*  CO2 24  GLUCOSE 75  BUN 42*  CREATININE 8.17*  CALCIUM 7.4*   ------------------------------------------------------------------------------------------------------------------  Cardiac Enzymes  Recent Labs Lab 02/21/15 0746  TROPONINI 0.09*   ------------------------------------------------------------------------------------------------------------------  RADIOLOGY:  Dg Chest Portable 1 View  02/21/2015  CLINICAL DATA:  Acute presentation with chest pain beginning 4 hours ago. Dialysis patient. EXAM: PORTABLE CHEST 1 VIEW COMPARISON:  01/24/2013 and previous FINDINGS: There is been previous median sternotomy and CABG. The heart is enlarged. There is pulmonary venous hypertension with early interstitial edema. There is pleural scarring on the left seen chronically. That could be a small amount of left pleural fluid and left lower lobe volume  loss. IMPRESSION: Findings likely indicating fluid overload/ mild congestive heart failure. Pleural density on the left that could be a combination of chronic scarring and pleural fluid. Electronically Signed   By: Paulina Fusi M.D.   On: 02/21/2015 08:19    EKG:   First-degree AV block no ST elevation or depression  IMPRESSION AND PLAN:    65 year old male with a history of CAD status post CABG, end-stage renal disease on hemodialysis presents non-ST elevation MI.   1. Non-ST elevation MI: Patient presents with chest pain relieved with imdur occurring frequently over the past few days. Patient is started on heparin gtt Patient will need ongoing telemetry monitoring. Cardiology has been consulted for further recommendations and management. Continue aspirin, Plavix, ISORDILCrestor and add low-dose Coreg. Continue to follow troponins.  2. Hyperlipidemia: Check lipid panel and continue Crestor.  3. ESRD: Patient will need dialysis. Nephrology has been consulted. Chest x-ray is consistent with fluid overload from the need for dialysis. Patient will require dialysis today.   All the records are reviewed and case discussed with ED provider. Management plans discussed with the patient and he is in agreement.  CODE STATUS: full  TOTAL TIME TAKING CARE OF THIS PATIENT: 50 minutes.    Travis [name] M.D on 02/21/2015 at 10:58 AM  Between 7am to 6pm - Pager - [phone] After 6pm go  to [email or web address] - password [email or web address]  [facility]  Office  [phone]  CC: Primary care physician; No primary care provider on file.

## 2015-02-21 NOTE — ED Provider Notes (Signed)
[facility] Emergency Department Provider Note  ____________________________________________  Time seen: Approximately 8:07 AM  I have reviewed the triage vital signs and the nursing notes.   HISTORY  Chief Complaint Chest Pain    HPI Travis [name] is a 65 y.o. male with a history of CAD status post CABG, reportedly known coronary artery blockages requiring referral to [facility] for intervention, ESRD on hemodialysis, presenting with "agitation."Patient reports that for the past year he has had severe angina which is generally treated at home with sublingual edge of glycerin and isosorbide dinitrate. Last night at midnight he developed an "agitated" feeling associated with sweating, nausea without vomiting, and strong urge to have a bowel movement that did not produce any stool. He felt better after isosorbide dinitrate. At 4 AM, he again had the same feelings. He denies any chest pain during these episodes. Today he is due for hemodialysis, but EMS was called when he told them about his symptoms. He was given aspirin, Nitropaste by EMS and states that he felt significantly better afterwards. He denies any lightheadedness or syncope. He denies any recent illness including cough, cold, fever, chills, vomiting or diarrhea.   Past Medical History  Diagnosis Date  . Chronic kidney disease   . Diabetes mellitus without complication (HCC)   . Dialysis patient (HCC)   . Coronary artery disease   . CHF (congestive heart failure) (HCC)     There are no active problems to display for this patient.   Past Surgical History  Procedure Laterality Date  . Foot surgery Right   . Arterial thrombectomy    . Cardiac surgery    . Vascular surgery      No current outpatient prescriptions on file.  Allergies Review of patient's allergies indicates no known allergies.  No family history on file.  Social History Social History  Substance Use Topics  . Smoking status:  Former Games developer  . Smokeless tobacco: Never Used  . Alcohol Use: No    Review of Systems Constitutional: No fever/chills. No lightheadedness or syncope. Positive generalized "agitated" feeling. Positive diaphoresis. Eyes: No visual changes. ENT: No sore throat. Cardiovascular: Denies chest pain, palpitations. Respiratory: Denies shortness of breath.  No cough. Gastrointestinal: No abdominal pain.  Positive nausea, no vomiting. Positive strong urge to have a bowel movement. No diarrhea.  No constipation. Genitourinary: No longer produces urine. Musculoskeletal: Negative for back pain. Skin: Negative for rash. Neurological: Negative for headaches, focal weakness or numbness.  10-point ROS otherwise negative.  ____________________________________________   PHYSICAL EXAM:  VITAL SIGNS: ED Triage Vitals  Enc Vitals Group     BP 02/21/15 0736 150/80 mmHg     Pulse Rate 02/21/15 0736 77     Resp 02/21/15 0736 21     Temp 02/21/15 0736 97.6 F (36.4 C)     Temp Source 02/21/15 0736 Oral     SpO2 02/21/15 0736 100 %     Weight 02/21/15 0736 200 lb 13.4 oz (91.1 kg)     Height 02/21/15 0736 5' 7.72" (1.72 m)     Head Cir --      Peak Flow --      Pain Score 02/21/15 0738 10     Pain Loc --      Pain Edu? --      Excl. in GC? --     Constitutional: Asian is alert and oriented and answering questions properly. He is chronically ill appearing but nontoxic at this time.  Eyes:  Conjunctivae are normal.  EOMI. Head: Atraumatic. Nose: No congestion/rhinnorhea. Mouth/Throat: Mucous membranes are moist.  Neck: No stridor.  Supple.  Positive JVD. Cardiovascular: Normal rate, regular rhythm. No murmurs, rubs or gallops.  Respiratory: Normal respiratory effort.  No retractions. Minimal Rales in the left lung base without rhonchi. No wheezes. Good air exchange. Gastrointestinal: Obese. Soft and nontender. No distention. No peritoneal signs. Musculoskeletal: Bilateral symmetric pitting  edema to the mid tibial shaft. No palpable cords or tenderness palpation in the calves. No Homans sign. Left upper extremity has an AV fistula with a normal thrill. Neurologic:  Normal speech and language. No gross focal neurologic deficits are appreciated. Moves all extremities well. Skin:  Skin is warm, dry and intact. No rash noted. Psychiatric: Mood and affect are normal. Speech and behavior are normal.  Normal judgement.  ____________________________________________   LABS (all labs ordered are listed, but only abnormal results are displayed)  Labs Reviewed  BASIC METABOLIC PANEL - Abnormal; Notable for the following:    Chloride 100 (*)    BUN 42 (*)    Creatinine, Ser 8.17 (*)    Calcium 7.4 (*)    GFR calc non Af Amer 6 (*)    GFR calc Af Amer 7 (*)    All other components within normal limits  CBC - Abnormal; Notable for the following:    WBC 3.1 (*)    RBC 3.11 (*)    Hemoglobin 10.7 (*)    HCT 32.5 (*)    MCV 104.6 (*)    MCH 34.5 (*)    RDW 16.1 (*)    Platelets 113 (*)    All other components within normal limits  TROPONIN I - Abnormal; Notable for the following:    Troponin I 0.09 (*)    All other components within normal limits  CULTURE, BLOOD (ROUTINE X 2)  CULTURE, BLOOD (ROUTINE X 2)  BRAIN NATRIURETIC PEPTIDE   ____________________________________________  EKG  ED ECG REPORT I, Rockne Menghini, the attending physician, personally viewed and interpreted this ECG.   Date: 02/21/2015  EKG Time: 735  Rate: 81  Rhythm: normal sinus rhythm  Axis: Normal  Intervals:first-degree A-V block  and prolonged QTC  ST&T Change: No ST elevation. No ischemic changes. Poor baseline tracing.  ____________________________________________  RADIOLOGY  Dg Chest Portable 1 View  02/21/2015  CLINICAL DATA:  Acute presentation with chest pain beginning 4 hours ago. Dialysis patient. EXAM: PORTABLE CHEST 1 VIEW COMPARISON:  01/24/2013 and previous FINDINGS: There  is been previous median sternotomy and CABG. The heart is enlarged. There is pulmonary venous hypertension with early interstitial edema. There is pleural scarring on the left seen chronically. That could be a small amount of left pleural fluid and left lower lobe volume loss. IMPRESSION: Findings likely indicating fluid overload/ mild congestive heart failure. Pleural density on the left that could be a combination of chronic scarring and pleural fluid. Electronically Signed   By: Paulina Fusi M.D.   On: 02/21/2015 08:19    ____________________________________________   PROCEDURES  Procedure(s) performed: None  Critical Care performed: No ____________________________________________   INITIAL IMPRESSION / ASSESSMENT AND PLAN / ED COURSE  Pertinent labs & imaging results that were available during my care of the patient were reviewed by me and considered in my medical decision making (see chart for details).  65 y.o. male with known coronary artery disease that is currently being evaluated for intervention at [facility] presenting with sweatiness, and agitated feeling, nausea. At this  time, the patient is feeling much better after intervention. He has not been having any chest pain but given his strong history of cardiac disease as well as multiple other comorbidities, we'll do cardiac workup. We will also check for infection as it is possible this sweatiness may be chills. He is afebrile here and has not been having infectious symptoms but he is high risk given his dialysis. I will get blood cultures and cyanosis well. Then to reevaluate after diagnostic evaluation has been completed.  ----------------------------------------- 8:32 AM on 02/21/2015 -----------------------------------------  The patient does have a positive troponin, and this number will need to be trended with serial EKGs. He has an acute on chronic renal failure with a baseline creatinine of 4 but today's creatinine is 8. His  chest x-ray does show fluid overload, which may be related to needing dialysis, but may be CHF as well. Plan admission to the hospital.  ____________________________________________  FINAL CLINICAL IMPRESSION(S) / ED DIAGNOSES  Final diagnoses:  NSTEMI (non-ST elevated myocardial infarction) (HCC)  Acute congestive heart failure, unspecified congestive heart failure type (HCC)  Acute renal failure, unspecified acute renal failure type (HCC)      NEW MEDICATIONS STARTED DURING THIS VISIT:  New Prescriptions   No medications on file     Rockne Menghini, MD 02/21/15 [phone]

## 2015-02-21 NOTE — Progress Notes (Signed)
ANTICOAGULATION CONSULT NOTE - Initial Consult  Pharmacy Consult for heparin drip dosing and monitoring Indication: chest pain/ACS  No Known Allergies  Patient Measurements: Height: 5' 7.72" (172 cm) Weight: 200 lb 13.4 oz (91.1 kg) IBW/kg (Calculated) : 67.75 Heparin Dosing Weight: 86.6 kg  Vital Signs: Temp: 97.6 F (36.4 C) (02/15 0736) Temp Source: Oral (02/15 0736) BP: 151/79 mmHg (02/15 1230) Pulse Rate: 65 (02/15 1230)  Labs:  Recent Labs  02/21/15 0746  HGB 10.7*  HCT 32.5*  PLT 113*  CREATININE 8.17*  TROPONINI 0.09*    Estimated Creatinine Clearance: 9.8 mL/min (by C-G formula based on Cr of 8.17).   Medical History: Past Medical History  Diagnosis Date  . Chronic kidney disease   . Diabetes mellitus without complication (HCC)   . Dialysis patient (HCC)   . Coronary artery disease   . CHF (congestive heart failure) (HCC)    Assessment: Pharmacy consulted to dose and monitor heparin drip in this 65 year old male with a history of CAD and ESRD requiring HD who presents complaining of chest pain. Patient was not taking any anticoagulants prior to admission and troponin on admission was 0.09.  Baseline labs: Hgb 10.7 Plt 113  APTT and INR ordered  Heparin dosing weight = 86.6 kg  Goal of Therapy:  Heparin level 0.3-0.7 units/ml Monitor platelets by anticoagulation protocol: Yes   Plan:  Give 3500 units bolus x 1 (~40 units/kg) Start heparin infusion at 1100 units/hr (~13 units/kg/hr) Check anti-Xa level in 8 hours and daily while on heparin Continue to monitor H&H and platelets  Thank you for the consult.  Cindi Carbon, PharmD Clinical Pharmacist 02/21/2015,1:17 PM

## 2015-02-21 NOTE — ED Notes (Signed)
Pt wants to stay.

## 2015-02-21 NOTE — ED Notes (Signed)
Patient wants to leave AMA, states he feels better.  I have paged admitting.

## 2015-02-22 LAB — DIFFERENTIAL
Basophils Absolute: 0 10*3/uL (ref 0–0.1)
Basophils Relative: 1 %
EOS PCT: 9 %
Eosinophils Absolute: 0.3 10*3/uL (ref 0–0.7)
LYMPHS ABS: 0.8 10*3/uL — AB (ref 1.0–3.6)
LYMPHS PCT: 23 %
MONO ABS: 0.3 10*3/uL (ref 0.2–1.0)
Monocytes Relative: 10 %
Neutro Abs: 1.9 10*3/uL (ref 1.4–6.5)
Neutrophils Relative %: 57 %

## 2015-02-22 LAB — MRSA PCR SCREENING: MRSA by PCR: NEGATIVE

## 2015-02-22 LAB — BASIC METABOLIC PANEL
Anion gap: 13 (ref 5–15)
BUN: 54 mg/dL — AB (ref 6–20)
CO2: 21 mmol/L — ABNORMAL LOW (ref 22–32)
Calcium: 6.9 mg/dL — ABNORMAL LOW (ref 8.9–10.3)
Chloride: 101 mmol/L (ref 101–111)
Creatinine, Ser: 9.39 mg/dL — ABNORMAL HIGH (ref 0.61–1.24)
GFR calc Af Amer: 6 mL/min — ABNORMAL LOW (ref >60)
GFR, EST NON AFRICAN AMERICAN: 5 mL/min — AB (ref >60)
Glucose, Bld: 88 mg/dL (ref 65–99)
POTASSIUM: 6.5 mmol/L — AB (ref 3.5–5.1)
SODIUM: 135 mmol/L (ref 135–145)

## 2015-02-22 LAB — CBC
HCT: 33 % — ABNORMAL LOW (ref 40.0–52.0)
Hemoglobin: 11 g/dL — ABNORMAL LOW (ref 13.0–18.0)
MCH: 34.2 pg — AB (ref 26.0–34.0)
MCHC: 33.4 g/dL (ref 32.0–36.0)
MCV: 102.3 fL — ABNORMAL HIGH (ref 80.0–100.0)
PLATELETS: 116 10*3/uL — AB (ref 150–440)
RBC: 3.22 MIL/uL — AB (ref 4.40–5.90)
RDW: 15.8 % — ABNORMAL HIGH (ref 11.5–14.5)
WBC: 3.4 10*3/uL — AB (ref 3.8–10.6)

## 2015-02-22 LAB — LIPID PANEL
CHOL/HDL RATIO: 2.8 ratio
Cholesterol: 170 mg/dL (ref 0–200)
HDL: 61 mg/dL (ref >40)
LDL CALC: 100 mg/dL — AB (ref 0–99)
Triglycerides: 44 mg/dL (ref <150)
VLDL: 9 mg/dL (ref 0–40)

## 2015-02-22 LAB — HEPARIN LEVEL (UNFRACTIONATED): HEPARIN UNFRACTIONATED: 0.66 [IU]/mL (ref 0.30–0.70)

## 2015-02-22 LAB — HEMOGLOBIN A1C: Hgb A1c MFr Bld: 5.3 % (ref 4.0–6.0)

## 2015-02-22 LAB — GLUCOSE, CAPILLARY: Glucose-Capillary: 74 mg/dL (ref 65–99)

## 2015-02-22 MED ORDER — SEVELAMER CARBONATE 800 MG PO TABS: 2400.0000 mg | ORAL_TABLET | Freq: Three times a day (TID) | ORAL | Status: AC

## 2015-02-22 MED ORDER — SEVELAMER CARBONATE 800 MG PO TABS: 1600.0000 mg | ORAL_TABLET | Freq: Three times a day (TID) | ORAL | Status: DC

## 2015-02-22 MED ORDER — CARVEDILOL 3.125 MG PO TABS: 3.1250 mg | ORAL_TABLET | Freq: Two times a day (BID) | ORAL | Status: AC

## 2015-02-22 NOTE — Progress Notes (Signed)
Travis [name] is a 65 y.o. male  [id number]  Primary Cardiologist: Adrian Blackwater Reason for Consultation:Chest pains  HPI: 47 YOHM presented to ER, with chest pains and was found to have mildly elevated troponins. No chest pains right now, he says it gets better after taking NTG. Las time he had chest pains was 3 months ago.   Review of Systems: No PND/Orthonea   Past Medical History  Diagnosis Date  . Chronic kidney disease   . Diabetes mellitus without complication (HCC)   . Dialysis patient (HCC)   . Coronary artery disease   . CHF (congestive heart failure) (HCC)     Medications Prior to Admission  Medication Sig Dispense Refill  . aspirin EC 81 MG tablet Take 81 mg by mouth daily.    . clopidogrel (PLAVIX) 75 MG tablet Take 1 tablet by mouth daily.    Marland Kitchen docusate sodium (COLACE) 50 MG capsule Take 100 mg by mouth 2 (two) times daily.    . isosorbide dinitrate (ISORDIL) 30 MG tablet Take 1 tablet by mouth 3 (three) times daily.    Marland Kitchen LANTUS SOLOSTAR 100 UNIT/ML Solostar Pen Inject 18 Units into the skin daily at 10 pm.     . NITROSTAT 0.4 MG SL tablet Place 0.4 mg under the tongue every 5 (five) minutes as needed.     . pantoprazole (PROTONIX) 40 MG tablet Take 40 mg by mouth daily.    . rosuvastatin (CRESTOR) 40 MG tablet Take 1 tablet by mouth daily.    . SENSIPAR 90 MG tablet Take 1 tablet by mouth 2 (two) times daily.    . Vitamin D, Ergocalciferol, (DRISDOL) 50000 units CAPS capsule Take 50,000 Units by mouth every 7 (seven) days.       Marland Kitchen aspirin EC  81 mg Oral Daily  . carvedilol  3.125 mg Oral BID WC  . cinacalcet  90 mg Oral BID WC  . clopidogrel  75 mg Oral Daily  . docusate sodium  100 mg Oral BID  . insulin aspart  0-5 Units Subcutaneous QHS  . insulin aspart  0-9 Units Subcutaneous TID WC  . insulin glargine  5 Units Subcutaneous QHS  . isosorbide dinitrate  30 mg Oral TID  . pantoprazole  40 mg Oral Daily  . rosuvastatin  40 mg Oral Daily  . sodium  chloride flush  3 mL Intravenous Q12H  . Vitamin D (Ergocalciferol)  50,000 Units Oral Q7 days    Infusions: . heparin 1,100 Units/hr (02/21/15 1355)    No Known Allergies  Social History   Social History  . Marital Status: Married    Spouse Name: N/A  . Number of Children: N/A  . Years of Education: N/A   Occupational History  . Not on file.   Social History Main Topics  . Smoking status: Former Games developer  . Smokeless tobacco: Never Used  . Alcohol Use: No  . Drug Use: No  . Sexual Activity: Not on file   Other Topics Concern  . Not on file   Social History Narrative    No family history on file.  PHYSICAL EXAM: Filed Vitals:   02/21/15 2020 02/22/15 0441  BP: 105/54 107/59  Pulse: 53 54  Temp: 98.2 F (36.8 C) 98 F (36.7 C)  Resp: 20 18     Intake/Output Summary (Last 24 hours) at 02/22/15 0941 Last data filed at 02/21/15 1630  Gross per 24 hour  Intake  6 ml  Output      0 ml  Net      6 ml    General:  Well appearing. No respiratory difficulty HEENT: normal Neck: supple. no JVD. Carotids 2+ bilat; no bruits. No lymphadenopathy or thryomegaly appreciated. Cor: PMI nondisplaced. Regular rate & rhythm. No rubs, gallops or murmurs. Lungs: clear Abdomen: soft, nontender, nondistended. No hepatosplenomegaly. No bruits or masses. Good bowel sounds. Extremities: no cyanosis, clubbing, rash, edema Neuro: alert & oriented x 3, cranial nerves grossly intact. moves all 4 extremities w/o difficulty. Affect pleasant.  ECG: NSR old aswmi  Results for orders placed or performed during the hospital encounter of 02/21/15 (from the past 24 hour(s))  Glucose, capillary     Status: Abnormal   Collection Time: 02/21/15  9:42 AM  Result Value Ref Range   Glucose-Capillary 60 (L) 65 - 99 mg/dL  Glucose, capillary     Status: Abnormal   Collection Time: 02/21/15 11:17 AM  Result Value Ref Range   Glucose-Capillary 136 (H) 65 - 99 mg/dL  Glucose, capillary      Status: None   Collection Time: 02/21/15  1:02 PM  Result Value Ref Range   Glucose-Capillary 78 65 - 99 mg/dL  Glucose, capillary     Status: Abnormal   Collection Time: 02/21/15  5:53 PM  Result Value Ref Range   Glucose-Capillary 128 (H) 65 - 99 mg/dL  Glucose, capillary     Status: Abnormal   Collection Time: 02/21/15  9:18 PM  Result Value Ref Range   Glucose-Capillary 105 (H) 65 - 99 mg/dL  Heparin level (unfractionated)     Status: None   Collection Time: 02/21/15 10:55 PM  Result Value Ref Range   Heparin Unfractionated 0.40 0.30 - 0.70 IU/mL  MRSA PCR Screening     Status: None   Collection Time: 02/22/15  1:34 AM  Result Value Ref Range   MRSA by PCR NEGATIVE NEGATIVE  Lipid panel     Status: Abnormal   Collection Time: 02/22/15  6:52 AM  Result Value Ref Range   Cholesterol 170 0 - 200 mg/dL   Triglycerides 44 <010 mg/dL   HDL 61 >27 mg/dL   Total CHOL/HDL Ratio 2.8 RATIO   VLDL 9 0 - 40 mg/dL   LDL Cholesterol 253 (H) 0 - 99 mg/dL  CBC     Status: Abnormal   Collection Time: 02/22/15  6:52 AM  Result Value Ref Range   WBC 3.4 (L) 3.8 - 10.6 K/uL   RBC 3.22 (L) 4.40 - 5.90 MIL/uL   Hemoglobin 11.0 (L) 13.0 - 18.0 g/dL   HCT 66.4 (L) 40.3 - 47.4 %   MCV 102.3 (H) 80.0 - 100.0 fL   MCH 34.2 (H) 26.0 - 34.0 pg   MCHC 33.4 32.0 - 36.0 g/dL   RDW 25.9 (H) 56.3 - 87.5 %   Platelets 116 (L) 150 - 440 K/uL  Basic metabolic panel     Status: Abnormal   Collection Time: 02/22/15  6:52 AM  Result Value Ref Range   Sodium 135 135 - 145 mmol/L   Potassium 6.5 (H) 3.5 - 5.1 mmol/L   Chloride 101 101 - 111 mmol/L   CO2 21 (L) 22 - 32 mmol/L   Glucose, Bld 88 65 - 99 mg/dL   BUN 54 (H) 6 - 20 mg/dL   Creatinine, Ser 6.43 (H) 0.61 - 1.24 mg/dL   Calcium 6.9 (L) 8.9 - 10.3 mg/dL   GFR  calc non Af Amer 5 (L) >60 mL/min   GFR calc Af Amer 6 (L) >60 mL/min   Anion gap 13 5 - 15  Heparin level (unfractionated)     Status: None   Collection Time: 02/22/15  6:52 AM  Result  Value Ref Range   Heparin Unfractionated 0.66 0.30 - 0.70 IU/mL  Differential     Status: Abnormal   Collection Time: 02/22/15  6:52 AM  Result Value Ref Range   Neutrophils Relative % 57 %   Neutro Abs 1.9 1.4 - 6.5 K/uL   Lymphocytes Relative 23 %   Lymphs Abs 0.8 (L) 1.0 - 3.6 K/uL   Monocytes Relative 10 %   Monocytes Absolute 0.3 0.2 - 1.0 K/uL   Eosinophils Relative 9 %   Eosinophils Absolute 0.3 0 - 0.7 K/uL   Basophils Relative 1 %   Basophils Absolute 0.0 0 - 0.1 K/uL  Glucose, capillary     Status: None   Collection Time: 02/22/15  7:37 AM  Result Value Ref Range   Glucose-Capillary 74 65 - 99 mg/dL   Comment 1 Notify RN    Dg Chest Portable 1 View  02/21/2015  CLINICAL DATA:  Acute presentation with chest pain beginning 4 hours ago. Dialysis patient. EXAM: PORTABLE CHEST 1 VIEW COMPARISON:  01/24/2013 and previous FINDINGS: There is been previous median sternotomy and CABG. The heart is enlarged. There is pulmonary venous hypertension with early interstitial edema. There is pleural scarring on the left seen chronically. That could be a small amount of left pleural fluid and left lower lobe volume loss. IMPRESSION: Findings likely indicating fluid overload/ mild congestive heart failure. Pleural density on the left that could be a combination of chronic scarring and pleural fluid. Electronically Signed   By: Paulina Fusi M.D.   On: 02/21/2015 08:19     ASSESSMENT AND PLAN: Has no chest pains at this time and mildly elevated troponins, with unchnaged EKG. Advise continue medical therapy and f/u office next Tuesday 10 am.  Draxton Luu A

## 2015-02-22 NOTE — Progress Notes (Signed)
[facility]  ROUNDING NOTE   Subjective:   Seen and examined Travis [name] hemodialysis. Tolerating treatment well. Admitted yesterday for chest pain.  Missed dialysis yesterday.   Objective:  Vital signs in last 24 hours:  Temp:  [98 F (36.7 C)-98.3 F (36.8 C)] 98 F (36.7 C) (02/16 0441) Pulse Rate:  [53-73] 54 (02/16 0441) Resp:  [18-22] 18 (02/16 0441) BP: (105-156)/(54-82) 107/59 mmHg (02/16 0441) SpO2:  [95 %-100 %] 96 % (02/16 0441) Weight:  [86.592 kg (190 lb 14.4 oz)] 86.592 kg (190 lb 14.4 oz) (02/15 1705)  Weight change:  Filed Weights   02/21/15 0736 02/21/15 1705  Weight: 91.1 kg (200 lb 13.4 oz) 86.592 kg (190 lb 14.4 oz)    Intake/Output: I/O last 3 completed shifts: In: 6 [I.V.:6] Out: -    Intake/Output this shift:     Physical Exam: General: NAD  Head: Normocephalic, atraumatic. Moist oral mucosal membranes  Eyes: Anicteric, PERRL  Neck: Supple, trachea midline  Lungs:  Clear to auscultation  Heart: Regular rate and rhythm  Abdomen:  Soft, nontender,   Extremities: no peripheral edema.  Neurologic: Nonfocal, moving all four extremities  Skin: No lesions  Access: Left forearm AVF    Basic Metabolic Panel:  Recent Labs Lab 02/21/15 0746 02/22/15 0652  NA 137 135  K 4.7 6.5*  CL 100* 101  CO2 24 21*  GLUCOSE 75 88  BUN 42* 54*  CREATININE 8.17* 9.39*  CALCIUM 7.4* 6.9*    Liver Function Tests: No results for input(s): AST, ALT, ALKPHOS, BILITOT, PROT, ALBUMIN in the last 168 hours. No results for input(s): LIPASE, AMYLASE in the last 168 hours. No results for input(s): AMMONIA in the last 168 hours.  CBC:  Recent Labs Lab 02/21/15 0746 02/22/15 0652  WBC 3.1* 3.4*  NEUTROABS  --  1.9  HGB 10.7* 11.0*  HCT 32.5* 33.0*  MCV 104.6* 102.3*  PLT 113* 116*    Cardiac Enzymes:  Recent Labs Lab 02/21/15 0746  TROPONINI 0.09*    BNP: Invalid input(s): POCBNP  CBG:  Recent Labs Lab 02/21/15 1117 02/21/15 1302  02/21/15 1753 02/21/15 2118 02/22/15 0737  GLUCAP 136* 78 128* 105* 74    Microbiology: Results for orders placed or performed during the hospital encounter of 02/21/15  Blood culture (routine x 2)     Status: None (Preliminary result)   Collection Time: 02/21/15  8:47 AM  Result Value Ref Range Status   Specimen Description BLOOD RIGHT WRIST  Final   Special Requests   Final    BOTTLES DRAWN AEROBIC AND ANAEROBIC AER ANA 0.5ML   Culture NO GROWTH < 24 HOURS  Final   Report Status PENDING  Incomplete  Blood culture (routine x 2)     Status: None (Preliminary result)   Collection Time: 02/21/15  8:52 AM  Result Value Ref Range Status   Specimen Description BLOOD RIGHT AC  Final   Special Requests   Final    BOTTLES DRAWN AEROBIC AND ANAEROBIC AER ANA   Culture NO GROWTH < 24 HOURS  Final   Report Status PENDING  Incomplete  MRSA PCR Screening     Status: None   Collection Time: 02/22/15  1:34 AM  Result Value Ref Range Status   MRSA by PCR NEGATIVE NEGATIVE Final    Comment:        The GeneXpert MRSA Assay (FDA approved for NASAL specimens only), is one component of a comprehensive MRSA colonization  surveillance program. It is not intended to diagnose MRSA infection nor to guide or monitor treatment for MRSA infections.     Coagulation Studies:  Recent Labs  02/21/15 0746  LABPROT 16.5*  INR 1.32    Urinalysis: No results for input(s): COLORURINE, LABSPEC, PHURINE, GLUCOSEU, HGBUR, BILIRUBINUR, KETONESUR, PROTEINUR, UROBILINOGEN, NITRITE, LEUKOCYTESUR in the last 72 hours.  Invalid input(s): APPERANCEUR    Imaging: Dg Chest Portable 1 View  02/21/2015  CLINICAL DATA:  Acute presentation with chest pain beginning 4 hours ago. Dialysis patient. EXAM: PORTABLE CHEST 1 VIEW COMPARISON:  01/24/2013 and previous FINDINGS: There is been previous median sternotomy and CABG. The heart is enlarged. There is pulmonary venous hypertension with early  interstitial edema. There is pleural scarring Travis [name] the left seen chronically. That could be a small amount of left pleural fluid and left lower lobe volume loss. IMPRESSION: Findings likely indicating fluid overload/ mild congestive heart failure. Pleural density Travis [name] the left that could be a combination of chronic scarring and pleural fluid. Electronically Signed   By: Paulina Fusi M.D.   Travis [name]: 02/21/2015 08:19     Medications:   . heparin 1,100 Units/hr (02/21/15 1355)   . aspirin EC  81 mg Oral Daily  . carvedilol  3.125 mg Oral BID WC  . cinacalcet  90 mg Oral BID WC  . clopidogrel  75 mg Oral Daily  . docusate sodium  100 mg Oral BID  . insulin aspart  0-5 Units Subcutaneous QHS  . insulin aspart  0-9 Units Subcutaneous TID WC  . insulin glargine  5 Units Subcutaneous QHS  . isosorbide dinitrate  30 mg Oral TID  . pantoprazole  40 mg Oral Daily  . rosuvastatin  40 mg Oral Daily  . sodium chloride flush  3 mL Intravenous Q12H  . Vitamin D (Ergocalciferol)  50,000 Units Oral Q7 days   acetaminophen **OR** acetaminophen, alum & mag hydroxide-simeth, HYDROcodone-acetaminophen, morphine injection, nitroGLYCERIN, ondansetron **OR** ondansetron (ZOFRAN) IV, senna-docusate  Assessment/ Plan:  Mr. Travis Travis [name] is a 65 y.o. Hispanic male with diabetes, hypertension, coronary artery disease s/p CABG 2011  MWF [facility] [address]   1. End Stage Renal Disease: missed dialysis yesterday. Short treatment today with minimal UF.  Tolerating treatment.   2. Hypertension: blood pressure at goal.  - carvedilol, imdur  3. Anemia of chronic kidney disease: hemoglobin 11.  - hold epo due to concern for ischemia  4. Secondary Hyperparathyroidism: PTH 1429 and phos 7. Gets hectorol outpatient.  - Renvela and sensipar    LOS: 1 Travis Travis [name] 2/16/201710:17 AM

## 2015-02-22 NOTE — Discharge Instructions (Addendum)
La aspirina y el corazn (Aspirin and Your Heart)  La aspirina es un medicamento que afecta la forma en que la sangre coagula. Puede utilizarse para reducir Nurse, adult de cogulos sanguneos, ataques cardacos y otros problemas relacionados con el corazn.  DEBO TOMAR ASPIRINA? El mdico lo ayudar a Chief Strategy Officer si es seguro y beneficioso para usted tomar una aspirina a diario. Tomar una aspirina a diario puede ser beneficioso si usted:  Tuvo un ataque cardaco o dolor de pecho.  Le realizaron una ciruga a corazn abierto, como ciruga de injerto de revascularizacin arterial coronaria (IRAC).  Le realizaron una angioplastia coronaria.  Tuvo un ictus o un ataque isqumico transitorio (AIT).  Tiene enfermedad vascular perifrica (EVP).  Tiene problemas de ritmo cardaco crnicos, como fibrilacin auricular. TOMAR ASPIRINA DIARIAMENTE, Kathie Rhodes RIESGO? El consumo diario de aspirina puede aumentar el riesgo de efectos secundarios. Entre ellos se incluyen los siguientes:  Hemorragia. Problemas de hemorragias, que pueden ser [employer] o graves. Por ejemplo, un problema menor es un corte que no deja de Geophysicist/field seismologist; uno ms grave, es Contractor o cerebral. El riesgo de hemorragias aumenta si tambin toma antiinflamatorios no esteroides (AINE).  Aumento de la formacin de hematomas.  Malestar estomacal.  Reacciones alrgicas. Las personas con plipos nasales corren un riesgo mayor de tener alergia a la aspirina. QU PAUTAS DEBO SEGUIR CUANDO TOMO ASPIRINA?   Tome aspirina solamente como se lo haya indicado el mdico. Asegrese de que comprende cunto debe tomar y cul debe ser su presentacin. Las dos presentaciones de la aspirina son:  No gastrorresistente. Este tipo de aspirina no es [place] y se absorbe rpidamente. Generalmente, la aspirina no gastrorresistente se recomienda para las personas con dolor de Stryker. Este tipo de aspirina tambin est disponible como  comprimido masticable.  Gastrorresistente. Este tipo de aspirina tiene un recubrimiento especial que libera el medicamento muy lentamente. La aspirina gastrorresistente causa menos malestar estomacal en comparacin con la opcin que no es Arts development officer. Este tipo de aspirina no debe masticarse ni triturarse.  Beba alcohol con moderacin. Beber alcohol aumenta el riesgo de Musician. CUNDO DEBO BUSCAR ATENCIN MDICA?   Tiene hemorragias o hematomas fuera de lo normal.  Siente dolor en el estmago.  Tiene una reaccin alrgica. Los sntomas de [place] reaccin alrgica incluyen los siguientes:  Ronchas.  Picazn en la piel.  Hinchazn de los labios, la lengua o el rostro.  Tiene zumbidos de odos. CUNDO DEBO BUSCAR ASISTENCIA MDICA INMEDIATA?   La materia fecal tiene [place], es de color rojo oscuro o negro.  Vomita o tose sangre.  Observa sangre en la orina.  Tose, tiene sibilancias o Company secretary. Si tiene Cisco, se trata de Radio broadcast assistant. No espere a que el dolor se vaya. Obtenga ayuda mdica de inmediato. Comunquese con el servicio de emergencias de su localidad (911 en los [place]). No conduzca por sus propios medios [employer] hospital.  Siente un dolor intenso en el pecho, especialmente si lo siente como una opresin o un peso y se extiende [employer], la espalda, el cuello o la [place].  Tiene sntomas similares a los del ictus, por ejemplo:  Prdida de la visin.  Dificultad para hablar.  Adormecimiento o debilidad de un lado del cuerpo.  Adormecimiento o debilidad de un brazo o una pierna.  No piensa con claridad o est confundido.   Esta informacin no tiene Theme park manager el consejo del mdico. Asegrese de hacerle al  mdico cualquier pregunta que tenga.   Document Released: 03/21/2008 Document Revised: 01/13/2014 Elsevier Interactive Patient Education 2016 [employer].  Dieta para  dilisis (Dialysis Diet) La dilisis es un tratamiento que limpia la sangre y se realiza cuando hay dao renal. Las personas que deben someterse a dilisis tienen que cuidar su dieta. Esto se debe a que algunos nutrientes pueden acumularse en la sangre entre un tratamiento y [place], y causar enfermedades. Estos nutrientes son los siguientes:  Potasio.  Fsforo.  Sodio. El mdico o el nutricionista harn lo siguiente:  Le dirn la cantidad de estos nutrientes que puede consumir.  Le dirn si, adems, tiene que tener cuidado con otros nutrientes.  Lo ayudarn a planificar las comidas.  Le dirn la cantidad de lquido que tiene que beber a diario. QU DEBO SABER ACERCA DE ESTA DIETA?  Limite el consumo de potasio. [place], las frutas y las verduras contienen potasio.  Limite el consumo de fsforo. [place], el queso, los frijoles, los frutos secos y las gaseosas contienen fsforo.  Limite el consumo de sal (sodio). Entre los alimentos que contienen una gran cantidad de sodio se incluyen las carnes procesadas y los embutidos, las comidas congeladas preelaboradas, las verduras enlatadas y las Paediatric nurse.  No use sustitutos de la sal.  Intente no comer alimentos integrales y alimentos que Sprint Nextel Corporation.  Siga las indicaciones del mdico respecto de la cantidad de lquido que tiene que beber. Es posible que le indiquen que:  Anote lo que bebe.  Anote los alimentos que consume que estn elaborados principalmente a base de agua, como gelatinas y sopas.  Beba los lquidos en tazas pequeas.  Pregntele al mdico si debe tomar un medicamento fijador de fsforo.  Tome vitaminas y suplementos minerales solamente como se lo haya indicado el mdico.  Coma carne, carne de ave, pescado y huevos. Limite el consumo de frutos secos y frijoles.  Antes de cocinar papas, crtelas en trozos pequeos y luego hirvalas en agua sin sal.  Elimine todo el lquido de las verduras  cocidas y las frutas enlatadas antes de consumirlas. QU ALIMENTOS PUEDO COMER? Cereales Pan blanco. Arroz blanco. Cereales cocidos. Palomitas de maz sin sal. Tortillas. Pastas. Verduras Brcoli, zanahorias y judas verdes frescas o congeladas. Repollo. Coliflor. Apio. Pepinos. Christella Noa. Rbanos. Calabacn. Frutas Manzanas. Frutos rojos frescos o congelados. Peras, duraznos y anan frescos o enlatados. Uvas. Ciruelas. Carnes y [address] fuentes de protenas Carne, cerdo, pollo y pescado frescos o congelados. Huevos. Atn o salmn enlatado con bajo contenido de sodio. Lcteos Queso crema. Crema espesa. Queso ricota. Bebidas Sidra de [place]. Jugo de arndanos. Mosto. Limonada. Caf negro. Condimentos Hierbas. Especias. Mermelada y [place]. Miel. Dulces y [employer]. Bizcochuelos. Galletas. Grasas y [address] [place] de [place], canola y [place]. Otros Crema no lctea. Cobertura batida no lctea. Caldos caseros sin sal. Los artculos mencionados arriba pueden no ser Ray[employer]on de las bebidas o los alimentos recomendados. Comunquese con el nutricionista para conocer ms opciones. QU ALIMENTOS NO SE RECOMIENDAN? Cereales Panes integrales. Pastas integrales. Cereales con alto contenido de [place]. Verduras Papas. Remolachas. Tomates. Zapallo y calabaza. Esprragos. Espinaca. Chiriva. Nils Pyle Carambola. Bananas. Naranjas. Kiwi. Pelones. Ciruelas pasas. Meln. Frutas desecadas. Aguacate. Carnes y otras fuentes de protenas Carnes enlatadas y [place], y embutidos. Fiambres envasados. Sardinas. Frutos secos y semillas. Mantequilla de man. Frijoles y legumbres. Lcteos Leche. Suero de [place]. Yogur. Queso y [employer]. Quesos untables procesados.  Bebidas Jugo de naranjas. Jugo de ciruelas. Bebidas gaseosas. Condimentos  Sal. Sustitutos de la sal. Salsa de soja.  Dulces y [employer]. Chocolate. Pralin de frutos secos. Grasas y Barnes & Noble.  Margarina. Otros Comidas congeladas preelaboradas. Sopas enlatadas.  Los artculos mencionados arriba pueden no ser Raytheon de las bebidas y los alimentos que se Theatre stage manager. Comunquese con el nutricionista para obtener ms informacin.   Esta informacin no tiene Theme park manager el consejo del mdico. Asegrese de hacerle al mdico cualquier pregunta que tenga.   Document Released: 06/24/2011 Document Revised: 01/13/2014 Elsevier Interactive Patient Education 2016 [employer].  Activity as tolerated Diet heart healthy, renal Follow-up with primary care physician in a week[facility] Follow-up with cardiology Dr. Welton Flakes on February 21 at 10 AM Continue hemodialysis on Tuesday Thursday and Saturday and follow-up with nephrology as per the recommendation

## 2015-02-22 NOTE — Care Management Note (Signed)
Patient is active at [facility] on MWF schedule.  Patient missed treatment yesterday at the outpatient center and came to hospital with complaint of chest pain.  Center notified of admission and will send additional medical records at discharge.  Ivor Reining Dialysis Coordinator  [phone]

## 2015-02-22 NOTE — Progress Notes (Signed)
Patient discharged via wheelchair and private vehicle. IV removed and catheter intact. All discharge instructions given and patient verbalizes understanding. Tele removed and returned. Prescriptions given to patient No distress noted.   

## 2015-02-22 NOTE — Discharge Summary (Signed)
[facility] - [place] at [facility]   PATIENT NAME: Travis [name]    MR#:  [id number]  DATE OF BIRTH:  03/22/1950  DATE OF ADMISSION:  02/21/2015 ADMITTING PHYSICIAN: Adrian Saran, MD  DATE OF DISCHARGE: 02/22/2015  PRIMARY CARE PHYSICIAN: No primary care provider on file.    ADMISSION DIAGNOSIS:  NSTEMI (non-ST elevated myocardial infarction) (HCC) [I21.4] Acute renal failure, unspecified acute renal failure type (HCC) [N17.9] Acute congestive heart failure, unspecified congestive heart failure type (HCC) [I50.9]  DISCHARGE DIAGNOSIS:  Chest pain resolved Pulmonary congestion-had hemodialysis End-stage renal disease  SECONDARY DIAGNOSIS:   Past Medical History  Diagnosis Date  . Chronic kidney disease   . Diabetes mellitus without complication (HCC)   . Dialysis patient (HCC)   . Coronary artery disease   . CHF (congestive heart failure) [facility])     HOSPITAL COURSE:   65 year old male with a history of CAD status post CABG, end-stage renal disease on hemodialysis presents non-ST elevation MI.   1. Non-ST elevation MI: Patient presents with chest pain relieved with imdur occurring frequently over the past few days. Troponins are 0.09, when compared to previous troponin which was at 0.11. Patient is started on heparin gtt, chest pain free today. -Seen by cardiology Dr. Welton Flakes has recommended medical management only and outpatient follow-up with him on February 21 at 10 Am Continue aspirin, Plavix, ISORDILCrestor and add low-dose Coreg, continue current home medications without any new changes   2. Hyperlipidemia: lipid panel-LDL at 100 and continue Crestor.  3. ESRD: Patient will need dialysis. Nephrology has been consulted patient is getting hemodialysis today Chest x-ray is consistent with fluid overload -getting hemodialysis today  Discharge patient home after dialysis Renvela added to the home meds  DISCHARGE CONDITIONS:    Fair CONSULTS  OBTAINED:  Treatment Team:  Laurier Nancy, MD Lamont Dowdy, MD   PROCEDURES none  DRUG ALLERGIES:  No Known Allergies  DISCHARGE MEDICATIONS:   Current Discharge Medication List    START taking these medications   Details  carvedilol (COREG) 3.125 MG tablet Take 1 tablet (3.125 mg total) by mouth 2 (two) times daily with a meal. Qty: 60 tablet, Refills: 0    sevelamer carbonate (RENVELA) 800 MG tablet Take 3 tablets (2,400 mg total) by mouth 3 (three) times daily with meals. Qty: 90 tablet, Refills: 0      CONTINUE these medications which have NOT CHANGED   Details  aspirin EC 81 MG tablet Take 81 mg by mouth daily.    clopidogrel (PLAVIX) 75 MG tablet Take 1 tablet by mouth daily.    docusate sodium (COLACE) 50 MG capsule Take 100 mg by mouth 2 (two) times daily.    isosorbide dinitrate (ISORDIL) 30 MG tablet Take 1 tablet by mouth 3 (three) times daily.    LANTUS SOLOSTAR 100 UNIT/ML Solostar Pen Inject 18 Units into the skin daily at 10 pm.     NITROSTAT 0.4 MG SL tablet Place 0.4 mg under the tongue every 5 (five) minutes as needed.     pantoprazole (PROTONIX) 40 MG tablet Take 40 mg by mouth daily.    rosuvastatin (CRESTOR) 40 MG tablet Take 1 tablet by mouth daily.    SENSIPAR 90 MG tablet Take 1 tablet by mouth 2 (two) times daily.    Vitamin D, Ergocalciferol, (DRISDOL) 50000 units CAPS capsule Take 50,000 Units by mouth every 7 (seven) days.         DISCHARGE INSTRUCTIONS:   Activity  as tolerated Diet heart healthy, renal Follow-up with primary care physician in a week[facility] Follow-up with cardiology Dr. Welton Flakes on February 21 at 10 AM Continue hemodialysis on Tuesday Thursday and Saturday and follow-up with nephrology as per the recommendation  DIET:  Renal diet  DISCHARGE CONDITION:  Fair  ACTIVITY:  Activity as tolerated  OXYGEN:  Home Oxygen: No.   Oxygen Delivery: room air  DISCHARGE LOCATION:  home   If you experience  worsening of your admission symptoms, develop shortness of breath, life threatening emergency, suicidal or homicidal thoughts you must seek medical attention immediately by calling 911 or calling your MD immediately  if symptoms less severe.  You Must read complete instructions/literature along with all the possible adverse reactions/side effects for all the Medicines you take and that have been prescribed to you. Take any new Medicines after you have completely understood and accpet all the possible adverse reactions/side effects.   Please note  You were cared for by a hospitalist during your hospital stay. If you have any questions about your discharge medications or the care you received while you were in the hospital after you are discharged, you can call the unit and asked to speak with the hospitalist on call if the hospitalist that took care of you is not available. Once you are discharged, your primary care physician will handle any further medical issues. Please note that NO REFILLS for any discharge medications will be authorized once you are discharged, as it is imperative that you return to your primary care physician (or establish a relationship with a primary care physician if you do not have one) for your aftercare needs so that they can reassess your need for medications and monitor your lab values.     Today  Chief Complaint  Patient presents with  . Chest Pain   Patient is resting comfortably. Denies any chest pain or shortness of breath. Had hemodialysis today. Cleared by cardiology to be discharged  ROS:  CONSTITUTIONAL: Denies fevers, chills. Denies any fatigue, weakness.  EYES: Denies blurry vision, double vision, eye pain. EARS, NOSE, THROAT: Denies tinnitus, ear pain, hearing loss. RESPIRATORY: Denies cough, wheeze, shortness of breath.  CARDIOVASCULAR: Denies chest pain, palpitations, edema.  GASTROINTESTINAL: Denies nausea, vomiting, diarrhea, abdominal pain. Denies  bright red blood per rectum. GENITOURINARY: Denies dysuria, hematuria. ENDOCRINE: Denies nocturia or thyroid problems. HEMATOLOGIC AND LYMPHATIC: Denies easy bruising or bleeding. SKIN: Denies rash or lesion. MUSCULOSKELETAL: Denies pain in neck, back, shoulder, knees, hips or arthritic symptoms.  NEUROLOGIC: Denies paralysis, paresthesias.  PSYCHIATRIC: Denies anxiety or depressive symptoms.   VITAL SIGNS:  Blood pressure 157/72, pulse 69, temperature 98.1 F (36.7 C), temperature source Oral, resp. rate 18, height 5\' 6"  (1.676 m), weight 86 kg (189 lb 9.5 oz), SpO2 100 %.  I/O:    Intake/Output Summary (Last 24 hours) at 02/22/15 1328 Last data filed at 02/22/15 1227  Gross per 24 hour  Intake    246 ml  Output   1000 ml  Net   -754 ml    PHYSICAL EXAMINATION:  GENERAL:  65 y.o.-year-old patient lying in the bed with no acute distress.  EYES: Pupils equal, round, reactive to light and accommodation. No scleral icterus. Extraocular muscles intact.  HEENT: Head atraumatic, normocephalic. Oropharynx and nasopharynx clear.  NECK:  Supple, no jugular venous distention. No thyroid enlargement, no tenderness.  LUNGS: Normal breath sounds bilaterally, no wheezing, rales,rhonchi or crepitation. No use of accessory muscles of respiration.  CARDIOVASCULAR: S1,  S2 normal. No murmurs, rubs, or gallops.  ABDOMEN: Soft, non-tender, non-distended. Bowel sounds present. No organomegaly or mass.  EXTREMITIES: No pedal edema, cyanosis, or clubbing.  NEUROLOGIC: Cranial nerves II through XII are intact. Muscle strength 5/5 in all extremities. Sensation intact. Gait not checked.  PSYCHIATRIC: The patient is alert and oriented x 3.  SKIN: No obvious rash, lesion, or ulcer.   DATA REVIEW:   CBC  Recent Labs Lab 02/22/15 0652  WBC 3.4*  HGB 11.0*  HCT 33.0*  PLT 116*    Chemistries   Recent Labs Lab 02/22/15 0652  NA 135  K 6.5*  CL 101  CO2 21*  GLUCOSE 88  BUN 54*   CREATININE 9.39*  CALCIUM 6.9*    Cardiac Enzymes  Recent Labs Lab 02/21/15 0746  TROPONINI 0.09*    Microbiology Results  Results for orders placed or performed during the hospital encounter of 02/21/15  Blood culture (routine x 2)     Status: None (Preliminary result)   Collection Time: 02/21/15  8:47 AM  Result Value Ref Range Status   Specimen Description BLOOD RIGHT WRIST  Final   Special Requests   Final    BOTTLES DRAWN AEROBIC AND ANAEROBIC AER ANA 0.5ML   Culture NO GROWTH < 24 HOURS  Final   Report Status PENDING  Incomplete  Blood culture (routine x 2)     Status: None (Preliminary result)   Collection Time: 02/21/15  8:52 AM  Result Value Ref Range Status   Specimen Description BLOOD RIGHT AC  Final   Special Requests   Final    BOTTLES DRAWN AEROBIC AND ANAEROBIC AER ANA   Culture NO GROWTH < 24 HOURS  Final   Report Status PENDING  Incomplete  MRSA PCR Screening     Status: None   Collection Time: 02/22/15  1:34 AM  Result Value Ref Range Status   MRSA by PCR NEGATIVE NEGATIVE Final    Comment:        The GeneXpert MRSA Assay (FDA approved for NASAL specimens only), is one component of a comprehensive MRSA colonization surveillance program. It is not intended to diagnose MRSA infection nor to guide or monitor treatment for MRSA infections.     RADIOLOGY:  Dg Chest Portable 1 View  02/21/2015  CLINICAL DATA:  Acute presentation with chest pain beginning 4 hours ago. Dialysis patient. EXAM: PORTABLE CHEST 1 VIEW COMPARISON:  01/24/2013 and previous FINDINGS: There is been previous median sternotomy and CABG. The heart is enlarged. There is pulmonary venous hypertension with early interstitial edema. There is pleural scarring on the left seen chronically. That could be a small amount of left pleural fluid and left lower lobe volume loss. IMPRESSION: Findings likely indicating fluid overload/ mild congestive heart failure. Pleural density on  the left that could be a combination of chronic scarring and pleural fluid. Electronically Signed   By: Paulina Fusi M.D.   On: 02/21/2015 08:19    EKG:   Orders placed or performed during the hospital encounter of 02/21/15  . EKG 12-Lead  . EKG 12-Lead  . ED EKG within 10 minutes  . ED EKG within 10 minutes      Management plans discussed with the patient, family and they are in agreement.  CODE STATUS:     Code Status Orders        Start     Ordered   02/21/15 1352  Full code   Continuous  02/21/15 1351    Code Status History    Date Active Date Inactive Code Status Order ID Comments User Context   This patient has a current code status but no historical code status.    Advance Directive Documentation        Most Recent Value   Type of Advance Directive  Living will   Pre-existing out of facility DNR order (yellow form or pink MOST form)     "MOST" Form in Place?        TOTAL TIME TAKING CARE OF THIS PATIENT: 45  minutes.    @[facility] @  on 02/22/2015 at 1:28 PM  Between 7am to 6pm - Pager - [phone]  After 6pm go to [email or web address] - password [email or web address] [facility]  [place] [facility]  Office  [phone]  CC: Primary care physician; No primary care provider on file.

## 2015-02-22 NOTE — Progress Notes (Signed)
ANTICOAGULATION CONSULT NOTE - Initial Consult  Pharmacy Consult for heparin drip dosing and monitoring Indication: chest pain/ACS  No Known Allergies  Patient Measurements: Height:  (167.6 cm) Weight: 190 lb 14.4 oz (86.592 kg) IBW/kg (Calculated) : 63.8 Heparin Dosing Weight: 86.6 kg  Vital Signs: Temp: 98 F (36.7 C) (02/16 0441) Temp Source: Oral (02/16 0441) BP: 107/59 mmHg (02/16 0441) Pulse Rate: 54 (02/16 0441)  Labs:  Recent Labs  02/21/15 0746 02/21/15 2255 02/22/15 0652  HGB 10.7*  --  11.0*  HCT 32.5*  --  33.0*  PLT 113*  --  116*  APTT 34  --   --   LABPROT 16.5*  --   --   INR 1.32  --   --   HEPARINUNFRC  --  0.40 0.66  CREATININE 8.17*  --   --   TROPONINI 0.09*  --   --     Estimated Creatinine Clearance: 9.3 mL/min (by C-G formula based on Cr of 8.17).   Medical History: Past Medical History  Diagnosis Date  . Chronic kidney disease   . Diabetes mellitus without complication (HCC)   . Dialysis patient (HCC)   . Coronary artery disease   . CHF (congestive heart failure) (HCC)    Assessment: Pharmacy consulted to dose and monitor heparin drip in this 65 year old male with a history of CAD and ESRD requiring HD who presents complaining of chest pain. Patient was not taking any anticoagulants prior to admission and troponin on admission was 0.09.  Baseline labs: Hgb 10.7 Plt 113  APTT and INR ordered  Heparin dosing weight = 86.6 kg  Goal of Therapy:  Heparin level 0.3-0.7 units/ml Monitor platelets by anticoagulation protocol: Yes   Plan:  Second confirmation level is therapeutic at 0.66. Will recheck level in the AM.   Olene Floss, Pharm.D. Clinical Pharmacist 02/22/2015,7:24 AM

## 2015-02-26 LAB — CULTURE, BLOOD (ROUTINE X 2)
CULTURE: NO GROWTH
Culture: NO GROWTH

## 2015-11-27 ENCOUNTER — Ambulatory Visit: Payer: Medicare Other | Admitting: Nurse Practitioner

## 2016-02-26 ENCOUNTER — Encounter (INDEPENDENT_AMBULATORY_CARE_PROVIDER_SITE_OTHER): Payer: Self-pay

## 2016-02-26 ENCOUNTER — Other Ambulatory Visit (INDEPENDENT_AMBULATORY_CARE_PROVIDER_SITE_OTHER): Payer: Self-pay

## 2016-02-26 DIAGNOSIS — Z992 Dependence on renal dialysis: Principal | ICD-10-CM

## 2016-02-26 DIAGNOSIS — N186 End stage renal disease: Secondary | ICD-10-CM

## 2016-02-28 ENCOUNTER — Other Ambulatory Visit (INDEPENDENT_AMBULATORY_CARE_PROVIDER_SITE_OTHER): Payer: Self-pay

## 2016-03-02 MED ORDER — CEFAZOLIN IN D5W 1 GM/50ML IV SOLN: 1.0000 g | Freq: Once | INTRAVENOUS | Status: AC

## 2016-03-03 ENCOUNTER — Encounter: Payer: Self-pay | Admitting: *Deleted

## 2016-03-03 ENCOUNTER — Ambulatory Visit
Admission: RE | Admit: 2016-03-03 | Discharge: 2016-03-03 | Disposition: A | Discharge status: Home / Self Care | Payer: Medicare Other | Source: Ambulatory Visit | Attending: Vascular Surgery | Admitting: Vascular Surgery

## 2016-03-03 ENCOUNTER — Encounter
Admission: RE | Disposition: A | Discharge status: Home / Self Care | Payer: Self-pay | Source: Ambulatory Visit | Attending: Vascular Surgery

## 2016-03-03 DIAGNOSIS — I509 Heart failure, unspecified: Secondary | ICD-10-CM | POA: Insufficient documentation

## 2016-03-03 DIAGNOSIS — I10 Essential (primary) hypertension: Secondary | ICD-10-CM | POA: Diagnosis not present

## 2016-03-03 DIAGNOSIS — Z87891 Personal history of nicotine dependence: Secondary | ICD-10-CM | POA: Diagnosis not present

## 2016-03-03 DIAGNOSIS — Z992 Dependence on renal dialysis: Secondary | ICD-10-CM | POA: Insufficient documentation

## 2016-03-03 DIAGNOSIS — I132 Hypertensive heart and chronic kidney disease with heart failure and with stage 5 chronic kidney disease, or end stage renal disease: Secondary | ICD-10-CM | POA: Diagnosis not present

## 2016-03-03 DIAGNOSIS — T82868A Thrombosis of vascular prosthetic devices, implants and grafts, initial encounter: Secondary | ICD-10-CM

## 2016-03-03 DIAGNOSIS — E119 Type 2 diabetes mellitus without complications: Secondary | ICD-10-CM | POA: Diagnosis not present

## 2016-03-03 DIAGNOSIS — T82898A Other specified complication of vascular prosthetic devices, implants and grafts, initial encounter: Secondary | ICD-10-CM | POA: Diagnosis not present

## 2016-03-03 DIAGNOSIS — E1122 Type 2 diabetes mellitus with diabetic chronic kidney disease: Secondary | ICD-10-CM | POA: Insufficient documentation

## 2016-03-03 DIAGNOSIS — N186 End stage renal disease: Secondary | ICD-10-CM | POA: Insufficient documentation

## 2016-03-03 DIAGNOSIS — I2510 Atherosclerotic heart disease of native coronary artery without angina pectoris: Secondary | ICD-10-CM | POA: Diagnosis not present

## 2016-03-03 DIAGNOSIS — Y832 Surgical operation with anastomosis, bypass or graft as the cause of abnormal reaction of the patient, or of later complication, without mention of misadventure at the time of the procedure: Secondary | ICD-10-CM | POA: Diagnosis not present

## 2016-03-03 HISTORY — PX: A/V FISTULAGRAM: CATH118298

## 2016-03-03 HISTORY — PX: A/V SHUNT INTERVENTION: CATH118220

## 2016-03-03 LAB — POTASSIUM (ARMC VASCULAR LAB ONLY): POTASSIUM (ARMC VASCULAR LAB): 3.2 — AB (ref 3.5–5.1)

## 2016-03-03 SURGERY — A/V FISTULAGRAM
Anesthesia: Moderate Sedation

## 2016-03-03 MED ORDER — HEPARIN SODIUM (PORCINE) 1000 UNIT/ML IJ SOLN
INTRAMUSCULAR | Status: AC
  Filled 2016-03-03: qty 1

## 2016-03-03 MED ORDER — ACETAMINOPHEN 325 MG PO TABS: 325.0000 mg | ORAL_TABLET | ORAL | Status: DC | PRN

## 2016-03-03 MED ORDER — HYDRALAZINE HCL 20 MG/ML IJ SOLN: 5.0000 mg | INTRAMUSCULAR | Status: DC | PRN

## 2016-03-03 MED ORDER — IOPAMIDOL (ISOVUE-300) INJECTION 61%
INTRAVENOUS | Status: DC | PRN
  Administered 2016-03-03: 20 mL via INTRAVENOUS

## 2016-03-03 MED ORDER — METOPROLOL TARTRATE 5 MG/5ML IV SOLN: 2.0000 mg | INTRAVENOUS | Status: DC | PRN

## 2016-03-03 MED ORDER — OXYCODONE-ACETAMINOPHEN 5-325 MG PO TABS: 1.0000 | ORAL_TABLET | ORAL | Status: DC | PRN

## 2016-03-03 MED ORDER — FENTANYL CITRATE (PF) 100 MCG/2ML IJ SOLN
INTRAMUSCULAR | Status: AC
  Filled 2016-03-03: qty 2

## 2016-03-03 MED ORDER — PHENOL 1.4 % MT LIQD
1.0000 | OROMUCOSAL | Status: DC | PRN
  Filled 2016-03-03: qty 177

## 2016-03-03 MED ORDER — LABETALOL HCL 5 MG/ML IV SOLN: 10.0000 mg | INTRAVENOUS | Status: DC | PRN

## 2016-03-03 MED ORDER — FENTANYL CITRATE (PF) 100 MCG/2ML IJ SOLN
INTRAMUSCULAR | Status: DC | PRN
Start: 2016-03-03 — End: 2016-03-03
  Administered 2016-03-03: 50 ug via INTRAVENOUS

## 2016-03-03 MED ORDER — ALUM & MAG HYDROXIDE-SIMETH 200-200-20 MG/5ML PO SUSP: 15.0000 mL | ORAL | Status: DC | PRN

## 2016-03-03 MED ORDER — MORPHINE SULFATE (PF) 4 MG/ML IV SOLN: 2.0000 mg | INTRAVENOUS | Status: DC | PRN

## 2016-03-03 MED ORDER — GUAIFENESIN-DM 100-10 MG/5ML PO SYRP: 15.0000 mL | ORAL_SOLUTION | ORAL | Status: DC | PRN

## 2016-03-03 MED ORDER — MIDAZOLAM HCL 5 MG/5ML IJ SOLN
INTRAMUSCULAR | Status: AC
  Filled 2016-03-03: qty 5

## 2016-03-03 MED ORDER — SODIUM CHLORIDE 0.9 % IV SOLN: 500.0000 mL | Freq: Once | INTRAVENOUS | Status: DC | PRN

## 2016-03-03 MED ORDER — LIDOCAINE-EPINEPHRINE (PF) 2 %-1:200000 IJ SOLN
INTRAMUSCULAR | Status: AC
  Filled 2016-03-03: qty 20

## 2016-03-03 MED ORDER — SODIUM CHLORIDE 0.9 % IV SOLN: INTRAVENOUS | Status: DC

## 2016-03-03 MED ORDER — CEFAZOLIN IN D5W 1 GM/50ML IV SOLN
1.0000 g | Freq: Once | INTRAVENOUS | Status: AC
  Administered 2016-03-03: 1 g via INTRAVENOUS

## 2016-03-03 MED ORDER — ONDANSETRON HCL 4 MG/2ML IJ SOLN: 4.0000 mg | Freq: Four times a day (QID) | INTRAMUSCULAR | Status: DC | PRN

## 2016-03-03 MED ORDER — HEPARIN (PORCINE) IN NACL 2-0.9 UNIT/ML-% IJ SOLN
INTRAMUSCULAR | Status: AC
  Filled 2016-03-03: qty 1000

## 2016-03-03 MED ORDER — MIDAZOLAM HCL 2 MG/2ML IJ SOLN
INTRAMUSCULAR | Status: DC | PRN
  Administered 2016-03-03: 2 mg via INTRAVENOUS

## 2016-03-03 MED ORDER — ACETAMINOPHEN 325 MG RE SUPP
325.0000 mg | RECTAL | Status: DC | PRN
  Filled 2016-03-03: qty 2

## 2016-03-03 SURGICAL SUPPLY — 6 items
CANNULA 5F STIFF (CANNULA) ×4 IMPLANT
DRAPE BRACHIAL (DRAPES) ×4 IMPLANT
PACK ANGIOGRAPHY (CUSTOM PROCEDURE TRAY) ×4 IMPLANT
SHEATH BRITE TIP 6FRX5.5 (SHEATH) ×4 IMPLANT
SUT MNCRL AB 4-0 PS2 18 (SUTURE) ×4 IMPLANT
TOWEL OR 17X26 4PK STRL BLUE (TOWEL DISPOSABLE) ×4 IMPLANT

## 2016-03-03 NOTE — Progress Notes (Signed)
Patient here today for left arm fistulogram per Dr Wyn Quakerew. Placed on monitor for procedure and sedation,will monitor vitals as well as etco2 during and throughout entire procedure.

## 2016-03-03 NOTE — H&P (Addendum)
[facility] Admission History & Physical  MRN : [id number]  Travis [name] is a 66 y.o. (1950-09-02) male who presents with chief complaint of No chief complaint on file. Marland Kitchen  History of Present Illness: I am asked to evaluate the patient by the dialysis center. The patient was sent here because they were unable to achieve adequate dialysis this morning. Furthermore the Center states there is very poor thrill and bruit. The patient states there there have been increasing problems with the access, such as low flow and prolonged bleeding after decannulation. The patient estimates these problems have been going on for several weeks. The patient is unaware of any other change.  Patient denies pain or tenderness overlying the access.  There is no pain with dialysis.  The patient denies hand pain or finger pain consistent with steal syndrome.   There have not been any recent past interventions or declots of this access.  The patient is not chronically hypotensive on dialysis.  No current facility-administered medications for this encounter.     Past Medical History:  Diagnosis Date  . CHF (congestive heart failure) (HCC)   . Chronic kidney disease   . Coronary artery disease   . Diabetes mellitus without complication (HCC)   . Dialysis patient [facility])     Past Surgical History:  Procedure Laterality Date  . ARTERIAL THROMBECTOMY    . CARDIAC SURGERY    . FOOT SURGERY Right   . VASCULAR SURGERY      Social History Social History  Substance Use Topics  . Smoking status: Former Games developer  . Smokeless tobacco: Never Used  . Alcohol use No  No IVDU  Family History  No family history of bleeding or clotting disorders, autoimmune disease or porphyria  No Known Allergies   REVIEW OF SYSTEMS (Negative unless checked)  Constitutional: [] Weight loss  [] Fever  [] Chills Cardiac: [] Chest pain   [] Chest pressure   [] Palpitations   [] Shortness of breath when laying  flat   [] Shortness of breath at rest   [x] Shortness of breath with exertion. Vascular:  [] Pain in legs with walking   [] Pain in legs at rest   [] Pain in legs when laying flat   [] Claudication   [] Pain in feet when walking  [] Pain in feet at rest  [] Pain in feet when laying flat   [] History of DVT   [] Phlebitis   [] Swelling in legs   [] Varicose veins   [x] Non-healing ulcers Pulmonary:   [] Uses home oxygen   [] Productive cough   [] Hemoptysis   [] Wheeze  [] COPD   [] Asthma Neurologic:  [] Dizziness  [] Blackouts   [] Seizures   [] History of stroke   [] History of TIA  [] Aphasia   [] Temporary blindness   [] Dysphagia   [] Weakness or numbness in arms   [] Weakness or numbness in legs Musculoskeletal:  [] Arthritis   [] Joint swelling   [] Joint pain   [] Low back pain Hematologic:  [] Easy bruising  [] Easy bleeding   [] Hypercoagulable state   [] Anemic  [] Hepatitis Gastrointestinal:  [] Blood in stool   [] Vomiting blood  [] Gastroesophageal reflux/heartburn   [] Difficulty swallowing. Genitourinary:  [x] Chronic kidney disease   [] Difficult urination  [] Frequent urination  [] Burning with urination   [] Blood in urine Skin:  [] Rashes   [x] Ulcers   [x] Wounds Psychological:  [] History of anxiety   []  History of major depression.  Physical Examination  There were no vitals filed for this visit. There is no height or weight on file to calculate BMI. Gen: WD/WN,  NAD Head: Rives/AT, No temporalis wasting. Prominent temp pulse not noted. Ear/Nose/Throat: Hearing grossly intact, nares w/o erythema or drainage, oropharynx w/o Erythema/Exudate,  Eyes: Conjunctiva clear, sclera non-icteric Neck: Trachea midline.  No JVD.  Pulmonary:  Good air movement, respirations not labored, no use of accessory muscles.  Cardiac: RRR, normal S1, S2. Vascular: weak thrill in AVF Vessel Right Left  Radial Palpable Palpable  Ulnar Not Palpable Not Palpable  Brachial Palpable Palpable      Gastrointestinal: soft, non-tender/non-distended. No  guarding/reflex.  Musculoskeletal: weakness and contractures present. Uses a wheelchair Neurologic: Sensation grossly intact in extremities.   Speech is fluent. Motor exam as listed above. Psychiatric: Judgment intact, Mood & affect appropriate for pt's clinical situation. Dermatologic: No rashes or ulcers noted.  No cellulitis or open wounds. Lymph : No Cervical, Axillary, or Inguinal lymphadenopathy.   CBC Lab Results  Component Value Date   WBC 3.4 (L) 02/22/2015   HGB 11.0 (L) 02/22/2015   HCT 33.0 (L) 02/22/2015   MCV 102.3 (H) 02/22/2015   PLT 116 (L) 02/22/2015    BMET    Component Value Date/Time   NA 135 02/22/2015 0652   NA 131 (L) 01/24/2013 1142   K 6.5 (H) 02/22/2015 0652   K 4.1 01/24/2013 1142   CL 101 02/22/2015 0652   CL 97 (L) 01/24/2013 1142   CO2 21 (L) 02/22/2015 0652   CO2 28 01/24/2013 1142   GLUCOSE 88 02/22/2015 0652   GLUCOSE 107 (H) 01/24/2013 1142   BUN 54 (H) 02/22/2015 0652   BUN 28 (H) 01/24/2013 1142   CREATININE 9.39 (H) 02/22/2015 0652   CREATININE 4.72 (H) 01/24/2013 1142   CALCIUM 6.9 (L) 02/22/2015 0652   CALCIUM 8.5 01/24/2013 1142   GFRNONAA 5 (L) 02/22/2015 0652   GFRNONAA 12 (L) 01/24/2013 1142   GFRAA 6 (L) 02/22/2015 0652   GFRAA 14 (L) 01/24/2013 1142   CrCl cannot be calculated (Patient's most recent lab result is older than the maximum 21 days allowed.).  COAG Lab Results  Component Value Date   INR 1.32 02/21/2015   INR 1.1 10/03/2012   INR 1.0 10/01/2012    Radiology No results found.  Assessment/Plan 1.  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 The patient will undergo angiography and correction of any problems using interventional techniques with the hope of restoring function to the access.  The risks and benefits were described to the patient.  All questions were answered.  The patient agrees to proceed with angiography and intervention. Potassium  will be drawn to ensure that it is an appropriate level prior to performing intervention. 2.  End-stage renal disease requiring hemodialysis:  Patient will continue dialysis therapy without further interruption if a successful intervention is not achieved then a tunneled catheter will be placed. Dialysis has already been arranged. 3.  Hypertension:  Patient will continue medical management; nephrology is following no changes in oral medications. 4. Diabetes mellitus:  Glucose will be monitored and oral medications been held this morning once the patient has undergone the patient's procedure po intake will be reinitiated and again Accu-Cheks will be used to assess the blood glucose level and treat as needed. The patient will be restarted on the patient's usual hypoglycemic regime    Festus BarrenJason Dew, MD  03/03/2016 8:33 AM

## 2016-03-03 NOTE — Op Note (Signed)
[facility]    OPERATIVE NOTE   PROCEDURE: 1.   Left brachiocephalic arteriovenous fistula cannulation under ultrasound guidance 2.   Left arm fistulagram including central venogram  PRE-OPERATIVE DIAGNOSIS: 1. ESRD 2. Poorly functional aneurysmal left brachiocephalic AVF  POST-OPERATIVE DIAGNOSIS: same as above   SURGEON: Festus BarrenJason Zachery Niswander, MD  ANESTHESIA: local with MCS  ESTIMATED BLOOD LOSS: minimal  FINDING(S): 1. Markedly aneurysmal left brachiocephalic AV fistula particularly at the access sites but enlarged all the way to the central venous circulation. There is no focal stenosis or narrowing of greater than 25% identified. Multiple ranches had been previously coil embolized successfully with several other branches remaining.  SPECIMEN(S):  None  CONTRAST: 30 cc  FLUORO TIME: Less than 1 minute  MODERATE CONSCIOUS SEDATION TIME: Approximately 15 minutes with 2 mg of Versed and 50 mcg of Fentanyl   INDICATIONS: Travis [name] is a 66 y.o. male who presents with malfunctioning, aneurysmal left brachiocephalic arteriovenous fistula.  The patient is scheduled for left arm fistulagram.  The patient is aware the risks include but are not limited to: bleeding, infection, thrombosis of the cannulated access, and possible anaphylactic reaction to the contrast.  The patient is aware of the risks of the procedure and elects to proceed forward.  DESCRIPTION: After full informed written consent was obtained, the patient was brought back to the angiography suite and placed supine upon the angiography table.  The patient was connected to monitoring equipment. Moderate conscious sedation was administered with a face to face encounter with the patient throughout the procedure with my supervision of the RN administering medicines and monitoring the patient's vital signs and mental status throughout from the start of the procedure until the patient was taken to the recovery  room. The left arm was prepped and draped in the standard fashion for a percutaneous access intervention.  Under ultrasound guidance, the left brachiocephalic arteriovenous fistula was cannulated with a micropuncture needle under direct ultrasound guidance and a permanent image was performed.  The microwire was advanced into the fistula and the needle was exchanged for the a microsheath.  I then upsized to a 6 Fr Sheath and imaging was performed.  Hand injections were completed to image the access including the central venous system. Compression of the fistula was performed to evaluate the arterial to venous anastomosis and brachial artery. This demonstrated a markedly aneurysmal left brachiocephalic AV fistula particularly at the access sites but enlarged all the way to the central venous circulation. There is no focal stenosis or narrowing of greater than 25% identified. Multiple ranches had been previously coil embolized successfully with several other branches remaining.  Based on the images, this patient will need no intervention other than a surgical revision if the aneurysmal portions develop worsening bleeding.  A 4-0 Monocryl purse-string suture was sewn around the sheath.  The sheath was removed while tying down the suture.  A sterile bandage was applied to the puncture site.  COMPLICATIONS: None  CONDITION: Stable   Festus BarrenJason Nasiah [name]  03/03/2016 10:39 AM   This note was created with Dragon Medical transcription system. Any errors in dictation are purely unintentional.

## 2016-03-03 NOTE — Discharge Instructions (Signed)
Follow up appointment on April 12th at 10:30 with Cleda DaubKimberly Stegmayer, PA-C at [facility]

## 2016-03-04 ENCOUNTER — Encounter: Payer: Self-pay | Admitting: Vascular Surgery

## 2016-04-17 ENCOUNTER — Encounter (INDEPENDENT_AMBULATORY_CARE_PROVIDER_SITE_OTHER): Payer: Medicare Other | Admitting: Vascular Surgery

## 2016-04-17 ENCOUNTER — Encounter (INDEPENDENT_AMBULATORY_CARE_PROVIDER_SITE_OTHER): Payer: Medicare Other

## 2016-09-24 ENCOUNTER — Emergency Department: Payer: Medicare Other

## 2016-09-24 ENCOUNTER — Inpatient Hospital Stay
Admission: EM | Admit: 2016-09-24 | Discharge: 2016-10-06 | DRG: 871 | Disposition: E | Payer: Medicare Other | Attending: Internal Medicine | Admitting: Internal Medicine

## 2016-09-24 ENCOUNTER — Encounter: Payer: Self-pay | Admitting: *Deleted

## 2016-09-24 DIAGNOSIS — I132 Hypertensive heart and chronic kidney disease with heart failure and with stage 5 chronic kidney disease, or end stage renal disease: Secondary | ICD-10-CM | POA: Diagnosis present

## 2016-09-24 DIAGNOSIS — L97418 Non-pressure chronic ulcer of right heel and midfoot with other specified severity: Secondary | ICD-10-CM | POA: Diagnosis present

## 2016-09-24 DIAGNOSIS — F329 Major depressive disorder, single episode, unspecified: Secondary | ICD-10-CM | POA: Diagnosis not present

## 2016-09-24 DIAGNOSIS — E11649 Type 2 diabetes mellitus with hypoglycemia without coma: Secondary | ICD-10-CM | POA: Diagnosis not present

## 2016-09-24 DIAGNOSIS — R1110 Vomiting, unspecified: Secondary | ICD-10-CM

## 2016-09-24 DIAGNOSIS — E1122 Type 2 diabetes mellitus with diabetic chronic kidney disease: Secondary | ICD-10-CM | POA: Diagnosis present

## 2016-09-24 DIAGNOSIS — A419 Sepsis, unspecified organism: Secondary | ICD-10-CM | POA: Diagnosis present

## 2016-09-24 DIAGNOSIS — L97923 Non-pressure chronic ulcer of unspecified part of left lower leg with necrosis of muscle: Secondary | ICD-10-CM | POA: Diagnosis present

## 2016-09-24 DIAGNOSIS — I959 Hypotension, unspecified: Secondary | ICD-10-CM | POA: Diagnosis not present

## 2016-09-24 DIAGNOSIS — E440 Moderate protein-calorie malnutrition: Secondary | ICD-10-CM | POA: Diagnosis present

## 2016-09-24 DIAGNOSIS — Z993 Dependence on wheelchair: Secondary | ICD-10-CM

## 2016-09-24 DIAGNOSIS — Z66 Do not resuscitate: Secondary | ICD-10-CM | POA: Diagnosis present

## 2016-09-24 DIAGNOSIS — L97913 Non-pressure chronic ulcer of unspecified part of right lower leg with necrosis of muscle: Secondary | ICD-10-CM

## 2016-09-24 DIAGNOSIS — T148XXA Other injury of unspecified body region, initial encounter: Secondary | ICD-10-CM | POA: Diagnosis not present

## 2016-09-24 DIAGNOSIS — E876 Hypokalemia: Secondary | ICD-10-CM | POA: Diagnosis present

## 2016-09-24 DIAGNOSIS — L89893 Pressure ulcer of other site, stage 3: Secondary | ICD-10-CM | POA: Diagnosis present

## 2016-09-24 DIAGNOSIS — Z7189 Other specified counseling: Secondary | ICD-10-CM

## 2016-09-24 DIAGNOSIS — D631 Anemia in chronic kidney disease: Secondary | ICD-10-CM | POA: Diagnosis present

## 2016-09-24 DIAGNOSIS — I96 Gangrene, not elsewhere classified: Secondary | ICD-10-CM | POA: Diagnosis not present

## 2016-09-24 DIAGNOSIS — L97914 Non-pressure chronic ulcer of unspecified part of right lower leg with necrosis of bone: Secondary | ICD-10-CM | POA: Diagnosis present

## 2016-09-24 DIAGNOSIS — L03115 Cellulitis of right lower limb: Secondary | ICD-10-CM | POA: Diagnosis present

## 2016-09-24 DIAGNOSIS — R6521 Severe sepsis with septic shock: Secondary | ICD-10-CM | POA: Diagnosis not present

## 2016-09-24 DIAGNOSIS — I739 Peripheral vascular disease, unspecified: Secondary | ICD-10-CM

## 2016-09-24 DIAGNOSIS — Z515 Encounter for palliative care: Secondary | ICD-10-CM | POA: Diagnosis not present

## 2016-09-24 DIAGNOSIS — Z452 Encounter for adjustment and management of vascular access device: Secondary | ICD-10-CM

## 2016-09-24 DIAGNOSIS — I248 Other forms of acute ischemic heart disease: Secondary | ICD-10-CM | POA: Diagnosis present

## 2016-09-24 DIAGNOSIS — I482 Chronic atrial fibrillation: Secondary | ICD-10-CM | POA: Diagnosis present

## 2016-09-24 DIAGNOSIS — N186 End stage renal disease: Secondary | ICD-10-CM | POA: Diagnosis not present

## 2016-09-24 DIAGNOSIS — Z89421 Acquired absence of other right toe(s): Secondary | ICD-10-CM

## 2016-09-24 DIAGNOSIS — F05 Delirium due to known physiological condition: Secondary | ICD-10-CM | POA: Diagnosis not present

## 2016-09-24 DIAGNOSIS — Z992 Dependence on renal dialysis: Secondary | ICD-10-CM

## 2016-09-24 DIAGNOSIS — Z87891 Personal history of nicotine dependence: Secondary | ICD-10-CM

## 2016-09-24 DIAGNOSIS — Z79899 Other long term (current) drug therapy: Secondary | ICD-10-CM

## 2016-09-24 DIAGNOSIS — E871 Hypo-osmolality and hyponatremia: Secondary | ICD-10-CM | POA: Diagnosis present

## 2016-09-24 DIAGNOSIS — Z9119 Patient's noncompliance with other medical treatment and regimen: Secondary | ICD-10-CM

## 2016-09-24 DIAGNOSIS — E875 Hyperkalemia: Secondary | ICD-10-CM | POA: Diagnosis not present

## 2016-09-24 DIAGNOSIS — E162 Hypoglycemia, unspecified: Secondary | ICD-10-CM

## 2016-09-24 DIAGNOSIS — R17 Unspecified jaundice: Secondary | ICD-10-CM

## 2016-09-24 DIAGNOSIS — Z951 Presence of aortocoronary bypass graft: Secondary | ICD-10-CM

## 2016-09-24 DIAGNOSIS — K625 Hemorrhage of anus and rectum: Secondary | ICD-10-CM | POA: Diagnosis not present

## 2016-09-24 DIAGNOSIS — L89152 Pressure ulcer of sacral region, stage 2: Secondary | ICD-10-CM | POA: Diagnosis present

## 2016-09-24 DIAGNOSIS — Z7982 Long term (current) use of aspirin: Secondary | ICD-10-CM

## 2016-09-24 DIAGNOSIS — Z7902 Long term (current) use of antithrombotics/antiplatelets: Secondary | ICD-10-CM

## 2016-09-24 DIAGNOSIS — E11621 Type 2 diabetes mellitus with foot ulcer: Secondary | ICD-10-CM | POA: Diagnosis not present

## 2016-09-24 DIAGNOSIS — M79604 Pain in right leg: Secondary | ICD-10-CM | POA: Diagnosis not present

## 2016-09-24 DIAGNOSIS — E1152 Type 2 diabetes mellitus with diabetic peripheral angiopathy with gangrene: Secondary | ICD-10-CM | POA: Diagnosis present

## 2016-09-24 DIAGNOSIS — L089 Local infection of the skin and subcutaneous tissue, unspecified: Secondary | ICD-10-CM | POA: Diagnosis not present

## 2016-09-24 DIAGNOSIS — N2581 Secondary hyperparathyroidism of renal origin: Secondary | ICD-10-CM | POA: Diagnosis present

## 2016-09-24 DIAGNOSIS — F32A Depression, unspecified: Secondary | ICD-10-CM

## 2016-09-24 DIAGNOSIS — I2510 Atherosclerotic heart disease of native coronary artery without angina pectoris: Secondary | ICD-10-CM | POA: Diagnosis present

## 2016-09-24 DIAGNOSIS — M79605 Pain in left leg: Secondary | ICD-10-CM | POA: Diagnosis not present

## 2016-09-24 DIAGNOSIS — Z22322 Carrier or suspected carrier of Methicillin resistant Staphylococcus aureus: Secondary | ICD-10-CM

## 2016-09-24 DIAGNOSIS — R630 Anorexia: Secondary | ICD-10-CM

## 2016-09-24 DIAGNOSIS — Z794 Long term (current) use of insulin: Secondary | ICD-10-CM

## 2016-09-24 DIAGNOSIS — L97903 Non-pressure chronic ulcer of unspecified part of unspecified lower leg with necrosis of muscle: Secondary | ICD-10-CM | POA: Diagnosis not present

## 2016-09-24 DIAGNOSIS — R112 Nausea with vomiting, unspecified: Secondary | ICD-10-CM | POA: Diagnosis not present

## 2016-09-24 DIAGNOSIS — I5032 Chronic diastolic (congestive) heart failure: Secondary | ICD-10-CM | POA: Diagnosis present

## 2016-09-24 DIAGNOSIS — L03119 Cellulitis of unspecified part of limb: Secondary | ICD-10-CM | POA: Diagnosis present

## 2016-09-24 DIAGNOSIS — R059 Cough, unspecified: Secondary | ICD-10-CM

## 2016-09-24 DIAGNOSIS — Z6830 Body mass index (BMI) 30.0-30.9, adult: Secondary | ICD-10-CM

## 2016-09-24 DIAGNOSIS — R410 Disorientation, unspecified: Secondary | ICD-10-CM

## 2016-09-24 DIAGNOSIS — R05 Cough: Secondary | ICD-10-CM

## 2016-09-24 DIAGNOSIS — L8990 Pressure ulcer of unspecified site, unspecified stage: Secondary | ICD-10-CM | POA: Insufficient documentation

## 2016-09-24 HISTORY — DX: Osteomyelitis, unspecified: M86.9

## 2016-09-24 LAB — CBC WITH DIFFERENTIAL/PLATELET
BASOS ABS: 0 10*3/uL (ref 0–0.1)
Basophils Relative: 1 %
Eosinophils Absolute: 0.3 10*3/uL (ref 0–0.7)
Eosinophils Relative: 4 %
HEMATOCRIT: 28.9 % — AB (ref 40.0–52.0)
HEMOGLOBIN: 10 g/dL — AB (ref 13.0–18.0)
LYMPHS ABS: 0.9 10*3/uL — AB (ref 1.0–3.6)
LYMPHS PCT: 14 %
MCH: 34.7 pg — AB (ref 26.0–34.0)
MCHC: 34.7 g/dL (ref 32.0–36.0)
MCV: 100.2 fL — AB (ref 80.0–100.0)
Monocytes Absolute: 0.7 10*3/uL (ref 0.2–1.0)
Monocytes Relative: 10 %
NEUTROS ABS: 4.7 10*3/uL (ref 1.4–6.5)
Neutrophils Relative %: 71 %
PLATELETS: 189 10*3/uL (ref 150–440)
RBC: 2.88 MIL/uL — AB (ref 4.40–5.90)
RDW: 16.3 % — ABNORMAL HIGH (ref 11.5–14.5)
WBC: 6.6 10*3/uL (ref 3.8–10.6)

## 2016-09-24 LAB — GLUCOSE, CAPILLARY
GLUCOSE-CAPILLARY: 27 mg/dL — AB (ref 65–99)
GLUCOSE-CAPILLARY: 178 mg/dL — AB (ref 65–99)
GLUCOSE-CAPILLARY: 73 mg/dL (ref 65–99)
GLUCOSE-CAPILLARY: 86 mg/dL (ref 65–99)
GLUCOSE-CAPILLARY: 96 mg/dL (ref 65–99)
GLUCOSE-CAPILLARY: 70 mg/dL (ref 65–99)
GLUCOSE-CAPILLARY: 57 mg/dL — AB (ref 65–99)
GLUCOSE-CAPILLARY: 65 mg/dL (ref 65–99)
Glucose-Capillary: 38 mg/dL — CL (ref 65–99)
Glucose-Capillary: 49 mg/dL — ABNORMAL LOW (ref 65–99)
Glucose-Capillary: 39 mg/dL — CL (ref 65–99)
Glucose-Capillary: 45 mg/dL — ABNORMAL LOW (ref 65–99)
Glucose-Capillary: 104 mg/dL — ABNORMAL HIGH (ref 65–99)
Glucose-Capillary: 53 mg/dL — ABNORMAL LOW (ref 65–99)

## 2016-09-24 LAB — HEMOGLOBIN A1C
Hgb A1c MFr Bld: 5.3 % (ref 4.8–5.6)
MEAN PLASMA GLUCOSE: 105.41 mg/dL

## 2016-09-24 LAB — COMPREHENSIVE METABOLIC PANEL
ALT: 20 U/L (ref 17–63)
ANION GAP: 8 (ref 5–15)
AST: 45 U/L — AB (ref 15–41)
Albumin: 2.3 g/dL — ABNORMAL LOW (ref 3.5–5.0)
Alkaline Phosphatase: 124 U/L (ref 38–126)
BILIRUBIN TOTAL: 1.9 mg/dL — AB (ref 0.3–1.2)
BUN: 15 mg/dL (ref 6–20)
CO2: 27 mmol/L (ref 22–32)
Calcium: 8.5 mg/dL — ABNORMAL LOW (ref 8.9–10.3)
Chloride: 98 mmol/L — ABNORMAL LOW (ref 101–111)
Creatinine, Ser: 3 mg/dL — ABNORMAL HIGH (ref 0.61–1.24)
GFR calc Af Amer: 23 mL/min — ABNORMAL LOW (ref >60)
GFR, EST NON AFRICAN AMERICAN: 20 mL/min — AB (ref >60)
Glucose, Bld: 37 mg/dL — CL (ref 65–99)
POTASSIUM: 2.6 mmol/L — AB (ref 3.5–5.1)
Sodium: 133 mmol/L — ABNORMAL LOW (ref 135–145)
TOTAL PROTEIN: 7.2 g/dL (ref 6.5–8.1)

## 2016-09-24 LAB — MAGNESIUM: Magnesium: 1.8 mg/dL (ref 1.7–2.4)

## 2016-09-24 LAB — SURGICAL PCR SCREEN
MRSA, PCR: POSITIVE — AB
Staphylococcus aureus: POSITIVE — AB

## 2016-09-24 LAB — TROPONIN I: Troponin I: 0.11 ng/mL (ref <0.03)

## 2016-09-24 LAB — LACTIC ACID, PLASMA: Lactic Acid, Venous: 1.3 mmol/L (ref 0.5–1.9)

## 2016-09-24 MED ORDER — SENNOSIDES-DOCUSATE SODIUM 8.6-50 MG PO TABS: 1.0000 | ORAL_TABLET | Freq: Every evening | ORAL | Status: DC | PRN

## 2016-09-24 MED ORDER — DEXTROSE 50 % IV SOLN
INTRAVENOUS | Status: AC
  Filled 2016-09-24: qty 50

## 2016-09-24 MED ORDER — ACETAMINOPHEN 650 MG RE SUPP: 650.0000 mg | Freq: Four times a day (QID) | RECTAL | Status: DC | PRN

## 2016-09-24 MED ORDER — HEPARIN SODIUM (PORCINE) 5000 UNIT/ML IJ SOLN
5000.0000 [IU] | Freq: Three times a day (TID) | INTRAMUSCULAR | Status: DC
  Administered 2016-09-24 – 2016-10-01 (×20): 5000 [IU] via SUBCUTANEOUS
  Filled 2016-09-24 (×20): qty 1

## 2016-09-24 MED ORDER — POTASSIUM CHLORIDE CRYS ER 20 MEQ PO TBCR
40.0000 meq | EXTENDED_RELEASE_TABLET | Freq: Once | ORAL | Status: AC
  Administered 2016-09-24: 40 meq via ORAL
  Filled 2016-09-24: qty 2

## 2016-09-24 MED ORDER — MORPHINE SULFATE (PF) 4 MG/ML IV SOLN
4.0000 mg | Freq: Once | INTRAVENOUS | Status: AC
  Administered 2016-09-24: 4 mg via INTRAVENOUS
  Filled 2016-09-24: qty 1

## 2016-09-24 MED ORDER — ASPIRIN EC 81 MG PO TBEC
81.0000 mg | DELAYED_RELEASE_TABLET | Freq: Every day | ORAL | Status: DC
  Administered 2016-09-24 – 2016-09-30 (×4): 81 mg via ORAL
  Filled 2016-09-24 (×6): qty 1

## 2016-09-24 MED ORDER — SODIUM CHLORIDE 0.9 % IV SOLN: 250.0000 mL | INTRAVENOUS | Status: DC | PRN

## 2016-09-24 MED ORDER — CLOPIDOGREL BISULFATE 75 MG PO TABS
75.0000 mg | ORAL_TABLET | Freq: Every day | ORAL | Status: DC
  Administered 2016-09-24: 75 mg via ORAL
  Filled 2016-09-24: qty 1

## 2016-09-24 MED ORDER — SODIUM CHLORIDE 0.9% FLUSH
3.0000 mL | Freq: Two times a day (BID) | INTRAVENOUS | Status: DC
  Administered 2016-09-25 – 2016-09-29 (×7): 3 mL via INTRAVENOUS

## 2016-09-24 MED ORDER — VANCOMYCIN HCL IN DEXTROSE 1-5 GM/200ML-% IV SOLN: 1000.0000 mg | Freq: Once | INTRAVENOUS | Status: DC

## 2016-09-24 MED ORDER — DEXTROSE 50 % IV SOLN
1.0000 | Freq: Once | INTRAVENOUS | Status: AC
  Administered 2016-09-24: 50 mL via INTRAVENOUS

## 2016-09-24 MED ORDER — ONDANSETRON HCL 4 MG/2ML IJ SOLN
4.0000 mg | Freq: Four times a day (QID) | INTRAMUSCULAR | Status: DC | PRN
Start: 2016-09-24 — End: 2016-10-05
  Administered 2016-09-25 – 2016-10-04 (×7): 4 mg via INTRAVENOUS
  Filled 2016-09-24 (×8): qty 2

## 2016-09-24 MED ORDER — VITAMIN D (ERGOCALCIFEROL) 1.25 MG (50000 UNIT) PO CAPS
50000.0000 [IU] | ORAL_CAPSULE | ORAL | Status: DC
  Administered 2016-09-30: 50000 [IU] via ORAL
  Filled 2016-09-24: qty 1

## 2016-09-24 MED ORDER — DEXTROSE 50 % IV SOLN: 50.0000 mL | INTRAVENOUS | Status: DC

## 2016-09-24 MED ORDER — VANCOMYCIN HCL 10 G IV SOLR
1750.0000 mg | Freq: Once | INTRAVENOUS | Status: AC
  Administered 2016-09-24: 1750 mg via INTRAVENOUS
  Filled 2016-09-24 (×2): qty 1750

## 2016-09-24 MED ORDER — ACETAMINOPHEN 325 MG PO TABS
650.0000 mg | ORAL_TABLET | Freq: Four times a day (QID) | ORAL | Status: DC | PRN
  Administered 2016-09-24: 650 mg via ORAL
  Filled 2016-09-24: qty 2

## 2016-09-24 MED ORDER — ONDANSETRON HCL 4 MG PO TABS
4.0000 mg | ORAL_TABLET | Freq: Four times a day (QID) | ORAL | Status: DC | PRN
Start: 2016-09-24 — End: 2016-10-01
  Filled 2016-09-24: qty 1

## 2016-09-24 MED ORDER — HYDROCODONE-ACETAMINOPHEN 5-325 MG PO TABS
1.0000 | ORAL_TABLET | ORAL | Status: DC | PRN
  Administered 2016-09-24: 1 via ORAL
  Administered 2016-09-25 – 2016-09-29 (×4): 2 via ORAL
  Filled 2016-09-24 (×2): qty 2
  Filled 2016-09-24 (×2): qty 1
  Filled 2016-09-24 (×2): qty 2

## 2016-09-24 MED ORDER — DEXTROSE 5 % IV SOLN
1.0000 g | Freq: Once | INTRAVENOUS | Status: AC
  Administered 2016-09-24: 1 g via INTRAVENOUS
  Filled 2016-09-24: qty 1

## 2016-09-24 MED ORDER — DEXTROSE 50 % IV SOLN: 2.0000 | Freq: Once | INTRAVENOUS | Status: DC

## 2016-09-24 MED ORDER — ISOSORBIDE DINITRATE 30 MG PO TABS
30.0000 mg | ORAL_TABLET | Freq: Three times a day (TID) | ORAL | Status: DC
  Filled 2016-09-24 (×2): qty 1

## 2016-09-24 MED ORDER — SODIUM CHLORIDE 0.9% FLUSH
3.0000 mL | INTRAVENOUS | Status: DC | PRN
  Administered 2016-09-27 – 2016-09-28 (×2): 3 mL via INTRAVENOUS
  Filled 2016-09-24 (×2): qty 3

## 2016-09-24 MED ORDER — METOPROLOL SUCCINATE ER 50 MG PO TB24
50.0000 mg | ORAL_TABLET | Freq: Every day | ORAL | Status: DC
Start: 2016-09-24 — End: 2016-09-24

## 2016-09-24 MED ORDER — DEXTROSE 10 % IV SOLN: INTRAVENOUS | Status: DC

## 2016-09-24 MED ORDER — ONDANSETRON HCL 4 MG/2ML IJ SOLN
4.0000 mg | Freq: Once | INTRAMUSCULAR | Status: AC
  Administered 2016-09-24: 4 mg via INTRAVENOUS
  Filled 2016-09-24: qty 2

## 2016-09-24 MED ORDER — DEXTROSE 50 % IV SOLN: 1.0000 | Freq: Once | INTRAVENOUS | Status: DC

## 2016-09-24 MED ORDER — ALBUTEROL SULFATE (2.5 MG/3ML) 0.083% IN NEBU
2.5000 mg | INHALATION_SOLUTION | RESPIRATORY_TRACT | Status: DC | PRN
  Administered 2016-09-27: 2.5 mg via RESPIRATORY_TRACT
  Filled 2016-09-24: qty 3

## 2016-09-24 MED ORDER — PIPERACILLIN-TAZOBACTAM 3.375 G IVPB
3.3750 g | Freq: Two times a day (BID) | INTRAVENOUS | Status: DC
  Administered 2016-09-24 – 2016-09-30 (×12): 3.375 g via INTRAVENOUS
  Filled 2016-09-24 (×12): qty 50

## 2016-09-24 MED ORDER — DEXTROSE-NACL 5-0.45 % IV SOLN
INTRAVENOUS | Status: DC
  Administered 2016-09-24: 14:00:00 via INTRAVENOUS

## 2016-09-24 MED ORDER — GLUCAGON HCL RDNA (DIAGNOSTIC) 1 MG IJ SOLR
1.0000 mg | Freq: Once | INTRAMUSCULAR | Status: AC | PRN
  Administered 2016-09-24: 1 mg via INTRAVENOUS
  Filled 2016-09-24: qty 1

## 2016-09-24 MED ORDER — DEXTROSE 50 % IV SOLN
INTRAVENOUS | Status: AC
  Administered 2016-09-24 (×2): 25 mL
  Filled 2016-09-24: qty 50

## 2016-09-24 MED ORDER — DEXTROSE 10 % IV SOLN
INTRAVENOUS | Status: DC
  Administered 2016-09-24: 18:00:00 via INTRAVENOUS

## 2016-09-24 MED ORDER — INSULIN ASPART 100 UNIT/ML ~~LOC~~ SOLN: 0.0000 [IU] | Freq: Three times a day (TID) | SUBCUTANEOUS | Status: DC

## 2016-09-24 NOTE — ED Notes (Signed)
Dr. Imogene Burn informed of patient's CBG.

## 2016-09-24 NOTE — ED Notes (Signed)
Interpreter Helen at bedside.

## 2016-09-24 NOTE — Progress Notes (Signed)
Hypoglycemic Event  CBG: 53  Treatment: 25ml of D 50 per hypoglycemia protocol  Symptoms: Drowsiness   Follow-up CBG: Time: 1912 CBG Result: 96  Possible Reasons for Event: Has had multiple blood glucose drops today, appears that he had lantus last night.  Comments/MD notified: Notified [facility] Doc Dr. Cherlynn Kaiser. No new orders received will continue to monitor.     Fay Records

## 2016-09-24 NOTE — H&P (Addendum)
[employer] - [place] [facility]   PATIENT NAME: Travis [name]    MR#:  [id number]  DATE OF BIRTH:  February 07, 1950  DATE OF ADMISSION:  20-Oct-2016  PRIMARY CARE PHYSICIAN: [facility]   REQUESTING/REFERRING PHYSICIAN: Myrna Blazer, MD  CHIEF COMPLAINT:   Chief Complaint  Patient presents with  . Leg Pain   Worsening neck pain and drainage for 3 weeks HISTORY OF PRESENT ILLNESS:  Travis [name]  is a 66 y.o. male with a known history of CHF, CAD, ESRD on HD, DM, osteomyelitis. The patient was sent from hemodialysis center to the ED due to worsening leg infection and hypoglycemia. The patient complains of bilateral leg pain, worsening wound infection and discharge. He was found low blood sugar surgeries, given D50 IV, blood sugar increased to 170s but back to 70s. He is on D5 infusion now. He was given vancomycin and Cefepime in the ED.  PAST MEDICAL HISTORY:   Past Medical History:  Diagnosis Date  . CHF (congestive heart failure) (HCC)   . Chronic kidney disease   . Coronary artery disease   . Diabetes mellitus without complication (HCC)   . Dialysis patient (HCC)   . Osteomyelitis (HCC)     PAST SURGICAL HISTORY:   Past Surgical History:  Procedure Laterality Date  . A/V FISTULAGRAM Left 03/03/2016   Procedure: A/V Fistulagram;  Surgeon: Annice Needy, MD;  Location: [facility] INVASIVE CV LAB;  Service: Cardiovascular;  Laterality: Left;  . A/V SHUNT INTERVENTION N/A 03/03/2016   Procedure: A/V Shunt Intervention;  Surgeon: Annice Needy, MD;  Location: [facility] INVASIVE CV LAB;  Service: Cardiovascular;  Laterality: N/A;  . ARTERIAL THROMBECTOMY    . CARDIAC SURGERY    . FOOT SURGERY Right   . VASCULAR SURGERY      SOCIAL HISTORY:   Social History  Substance Use Topics  . Smoking status: Former Games developer  . Smokeless tobacco: Never Used  . Alcohol use No    FAMILY HISTORY:  No family history on file.  DRUG ALLERGIES:    No Known Allergies  REVIEW OF SYSTEMS:   Review of Systems  Constitutional: Positive for malaise/fatigue. Negative for chills and fever.  HENT: Negative for sore throat.   Eyes: Negative for blurred vision and double vision.  Respiratory: Positive for shortness of breath. Negative for cough, hemoptysis, wheezing and stridor.   Cardiovascular: Negative for chest pain, palpitations, orthopnea and leg swelling.  Gastrointestinal: Negative for abdominal pain, blood in stool, diarrhea, melena, nausea and vomiting.  Genitourinary: Negative for dysuria, flank pain and hematuria.  Musculoskeletal: Negative for back pain and joint pain.       Left leg discharge and drainage.  Neurological: Positive for weakness. Negative for dizziness, sensory change, focal weakness, seizures, loss of consciousness and headaches.  Endo/Heme/Allergies: Negative for polydipsia.  Psychiatric/Behavioral: Negative for depression. The patient is not nervous/anxious.     MEDICATIONS AT HOME:   Prior to Admission medications   Medication Sig Start Date End Date Taking? Authorizing Provider  aspirin EC 81 MG tablet Take 81 mg by mouth daily.   Yes [provider]  clopidogrel (PLAVIX) 75 MG tablet Take 1 tablet by mouth daily. 02/08/15  Yes [provider]  insulin detemir (LEVEMIR) 100 UNIT/ML injection Inject 10 Units into the skin daily.   Yes [provider]  isosorbide dinitrate (ISORDIL) 30 MG tablet Take 1 tablet by mouth 3 (three) times daily. 02/08/15  Yes [provider]  metoprolol succinate (TOPROL-XL) 50 MG 24 hr tablet Take 50 mg by mouth daily. Take with or immediately following a meal.   Yes [provider]  Vitamin D, Ergocalciferol, (DRISDOL) 50000 units CAPS capsule Take 50,000 Units by mouth every 7 (seven) days.   Yes [provider]  carvedilol (COREG) 3.125 MG tablet Take 1 tablet (3.125 mg total) by mouth 2 (two) times daily with a meal. Patient  not taking: Reported on 2016/09/29 02/22/15   Ramonita Lab, MD  sevelamer carbonate (RENVELA) 800 MG tablet Take 3 tablets (2,400 mg total) by mouth 3 (three) times daily with meals. Patient not taking: Reported on 09-29-16 02/22/15   Ramonita Lab, MD      VITAL SIGNS:  Blood pressure 121/75, pulse 95, temperature 98.7 F (37.1 C), temperature source Oral, resp. rate 17, weight 176 lb 5.9 oz (80 kg), SpO2 99 %.  PHYSICAL EXAMINATION:  Physical Exam  GENERAL:  66 y.o.-year-old patient lying in the bed with no acute distress.  EYES: Pupils equal, round, reactive to light and accommodation. No scleral icterus. Extraocular muscles intact.  HEENT: Head atraumatic, normocephalic. Oropharynx and nasopharynx clear.  NECK:  Supple, no jugular venous distention. No thyroid enlargement, no tenderness.  LUNGS: Normal breath sounds bilaterally, no wheezing, rales,rhonchi or crepitation. No use of accessory muscles of respiration.  CARDIOVASCULAR: S1, S2 normal. No murmurs, rubs, or gallops.  ABDOMEN: Soft, nontender, nondistended. Bowel sounds present. No organomegaly or mass.  EXTREMITIES: Wound infection with foul smelling, discharge from left leg. S/P right forefoot amputation. No cyanosis, or clubbing.  NEUROLOGIC: Cranial nerves II through XII are intact. Muscle strength 4/5 in all extremities. Sensation intact. Gait not checked.  PSYCHIATRIC: The patient is alert and oriented x 3.  SKIN: No obvious rash, lesion, or ulcer.   LABORATORY PANEL:   CBC  Recent Labs Lab 09/29/2016 1221  WBC 6.6  HGB 10.0*  HCT 28.9*  PLT 189   ------------------------------------------------------------------------------------------------------------------  Chemistries   Recent Labs Lab 2016-09-29 1221  NA 133*  K 2.6*  CL 98*  CO2 27  GLUCOSE 37*  BUN 15  CREATININE 3.00*  CALCIUM 8.5*  AST 45*  ALT 20  ALKPHOS 124  BILITOT 1.9*    ------------------------------------------------------------------------------------------------------------------  Cardiac Enzymes  Recent Labs Lab 09/29/16 1221  TROPONINI 0.11*   ------------------------------------------------------------------------------------------------------------------  RADIOLOGY:  Dg Foot Complete Right  Result Date: September 29, 2016 CLINICAL DATA:  Diabetes, transmetatarsal amputation, soft tissue ulceration EXAM: RIGHT FOOT COMPLETE - 3+ VIEW COMPARISON:  01/02/2009 FINDINGS: Large soft tissue ulceration of the transmetatarsal soft tissue site medially. No radiopaque foreign body. Transmetatarsal amputation noted. No definite acute bony destruction, periostitis, or fracture. Peripheral atherosclerosis noted. Bones are osteopenic. IMPRESSION: Large soft tissue ulceration of the transmetatarsal amputation soft tissues. No underlying acute or abnormal osseous finding by plain radiography. Peripheral atherosclerosis Electronically Signed   By: Judie Petit.  Shick M.D.   On: Sep 29, 2016 14:04      IMPRESSION AND PLAN:   Left leg wound infection The patient will be admitted to medical floor. Continue vancomycin and Zosyn, ID consult, general surgery consult and wound care.  Hypoglycemia and DM2. On D5 IV infusion, Accu-Chek and sliding scale. Hold levemir. BS down to 45, given D50 again, changed to D10 infusion. CBG Q2h.  Hypokalemia. Give potassium supplement and follow-up level.  Hyponatremia. Follow-up BMP.  Elevated troponin. Likely chronic due to ESRD. Elevated bilirubin. Follow-up bilirubin level. Abdominal ultrasound. Hypertension. Continue Lopressor. PVD. Continue aspirin and Plavix. ESRD. Nephrology  consult for hemodialysis. Anemia of chronic disease. Stable.  All the records are reviewed and case discussed with ED provider. Management plans discussed with the patient, family and they are in agreement.  CODE STATUS: Full code  TOTAL TIME TAKING CARE OF  THIS PATIENT: 60 minutes.    Shaune Pollack M.D on 09/28/2016 at 3:06 PM  Between 7am to 6pm - Pager - [phone]  After 6pm go to [email or web address] - Scientist, research (life sciences) [facility]  Office  [phone]  CC: Primary care physician; [facility]   Note: This dictation was prepared with Nurse, children's dictation along with smaller phrase technology. Any transcriptional errors that result from this process are unin

## 2016-09-24 NOTE — Progress Notes (Signed)
Per Dr. Cherlynn Kaiser hold isosorbide and metoprolol for low BP. MD to place order for wound care consult as RN unsure what wound care would be best.

## 2016-09-24 NOTE — ED Notes (Signed)
Report given to [facility] on the floor.

## 2016-09-24 NOTE — ED Notes (Signed)
Dr. Pershing Proud informed of blood glucose of 27. Patient given cola, peanut butter and orange juice.

## 2016-09-24 NOTE — ED Notes (Signed)
Dr. Pershing Proud aware of potassium of 2.6.

## 2016-09-24 NOTE — Progress Notes (Addendum)
Notified Dr. Imogene Burn of blood glucose of 45 upon transfer form ED. Protocol used and D10 started at 75. Pt sleepy and lethargic so 25ml of D50 given IV push. In need of more frequent fingersticks MD to place order for q2 fingersticks. Also MD to discontinue tele and pulse ox as these are ED orders. MD to also place order for NPO after midnight as pt is to have abdominal ultrasound tomorrow.
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
## 2016-09-24 NOTE — Consult Note (Signed)
SURGICAL CONSULTATION NOTE (initial) - cpt: 99255  HISTORY OF PRESENT ILLNESS (HPI):  History was obtained with the assistance of Spanish-English translator.  66 y.o. male with chronic CAD, ESRD, PAD and non-healing B/L lower extremity wounds complicated by medical non-compliance presented to [facility] ED upon referral from hemodialysis for hypoglycemia and B/L lower extremity wounds. Patient reports he first noticed the Right heal wound in 06/2016 and has since been wheelchair-bound and non-ambulatory following [facility] admission for workup, though review of OSH medical record indicates Right plantar foot wound has been present since at least 12/2015 s/p healing of prior Right TMA following Right PT angioplasty by [facility] vascular surgery, after which the LLE wounds developed and worsened.   Patient was last seen for his wounds 08/19/2016 by [facility] wound clinic, at which time it was stated "There is extensive eschar present on all wounds. The extent is such that we are unable to debride these wounds in the clinic. Therefore we will consider operative debridement and will contact Dr. Gayla Doss in this regard." This does not appear to have occurred. Patient reports B/L lower extremity pain not relieved with dependent position, denies fever/chills, CP, or SOB.  Surgery is consulted by medical physician Dr. Imogene Burn in this context for evaluation and management B/L lower extremity wounds.  PAST MEDICAL HISTORY (PMH):  Past Medical History:  Diagnosis Date  . CHF (congestive heart failure) (HCC)   . Chronic kidney disease   . Coronary artery disease   . Diabetes mellitus without complication (HCC)   . Dialysis patient (HCC)   . Osteomyelitis (HCC)      PAST SURGICAL HISTORY (PSH):  Past Surgical History:  Procedure Laterality Date  . A/V FISTULAGRAM Left 03/03/2016   Procedure: A/V Fistulagram;  Surgeon: Annice Needy, MD;  Location: [facility] INVASIVE CV LAB;  Service: Cardiovascular;  Laterality: Left;  . A/V SHUNT  INTERVENTION N/A 03/03/2016   Procedure: A/V Shunt Intervention;  Surgeon: Annice Needy, MD;  Location: [facility] INVASIVE CV LAB;  Service: Cardiovascular;  Laterality: N/A;  . ARTERIAL THROMBECTOMY    . CARDIAC SURGERY    . FOOT SURGERY Right   . VASCULAR SURGERY       MEDICATIONS:  Prior to Admission medications   Medication Sig Start Date End Date Taking? Authorizing Provider  aspirin EC 81 MG tablet Take 81 mg by mouth daily.   Yes [provider]  clopidogrel (PLAVIX) 75 MG tablet Take 1 tablet by mouth daily. 02/08/15  Yes [provider]  insulin detemir (LEVEMIR) 100 UNIT/ML injection Inject 10 Units into the skin daily.   Yes [provider]  isosorbide dinitrate (ISORDIL) 30 MG tablet Take 1 tablet by mouth 3 (three) times daily. 02/08/15  Yes [provider]  metoprolol succinate (TOPROL-XL) 50 MG 24 hr tablet Take 50 mg by mouth daily. Take with or immediately following a meal.   Yes [provider]  Vitamin D, Ergocalciferol, (DRISDOL) 50000 units CAPS capsule Take 50,000 Units by mouth every 7 (seven) days.   Yes [provider]  carvedilol (COREG) 3.125 MG tablet Take 1 tablet (3.125 mg total) by mouth 2 (two) times daily with a meal. Patient not taking: Reported on 10/03/2016 02/22/15   Ramonita Lab, MD  sevelamer carbonate (RENVELA) 800 MG tablet Take 3 tablets (2,400 mg total) by mouth 3 (three) times daily with meals. Patient not taking: Reported on 09/13/2016 02/22/15   Ramonita Lab, MD     ALLERGIES:  No Known Allergies  SOCIAL HISTORY:  Social History   Social History  . Marital status: Married    Spouse name: N/A  . Number of children: N/A  . Years of education: N/A   Occupational History  . Not on file.   Social History Main Topics  . Smoking status: Former Games developer  . Smokeless tobacco: Never Used  . Alcohol use No  . Drug use: No  . Sexual activity: Not on file   Other Topics Concern  . Not on file    Social History Narrative  . No narrative on file    The patient currently resides (home / rehab facility / nursing home): Home The patient normally is (ambulatory / bedbound): Wheelchair-bound (non-ambulatory) since 06/2016  FAMILY HISTORY:  No family history on file.   REVIEW OF SYSTEMS:  Constitutional: denies weight loss, fever, chills, or sweats  Eyes: denies any other vision changes, history of eye injury  ENT: denies sore throat, hearing problems  Respiratory: denies shortness of breath, wheezing  Cardiovascular: denies chest pain, palpitations  Gastrointestinal: denies abdominal pain, N/V, or diarrhea/and bowel function as per HPI Genitourinary: denies burning with urination or urinary frequency Musculoskeletal: denies any other joint pains or cramps  Skin: denies any other rashes or skin discolorations except as per HPI Neurological: denies any other headache, dizziness, weakness  Psychiatric: denies any other depression, anxiety   All other review of systems were negative   VITAL SIGNS:  Temp:  [98.7 F (37.1 C)-99.5 F (37.5 C)] 99.1 F (37.3 C) (09/19 1824) Pulse Rate:  [91-114] 106 (09/19 1735) Resp:  [17-26] 24 (09/19 1735) BP: (94-124)/(65-81) 94/65 (09/19 1735) SpO2:  [92 %-100 %] 94 % (09/19 1735) Weight:  [176 lb 5.9 oz (80 kg)] 176 lb 5.9 oz (80 kg) (09/19 1122)       Weight: 176 lb 5.9 oz (80 kg)     INTAKE/OUTPUT:  This shift: No intake/output data recorded.  Last 2 shifts: @   PHYSICAL EXAM:  Constitutional:  -- Normal body habitus  -- Awake, alert, and oriented x3  Eyes:  -- Pupils equally round and reactive to light  -- No scleral icterus  Ear, nose, and throat:  -- No jugular venous distension  Pulmonary:  -- No crackles  -- Equal breath sounds bilaterally -- Breathing non-labored at rest Cardiovascular:  -- S1, S2 present  -- No pericardial rubs Gastrointestinal:  -- Abdomen soft, nontender, non-distended, no guarding  or rebound tenderness -- No abdominal masses appreciated, pulsatile or otherwise  Musculoskeletal and Integumentary:  -- Wounds or skin discoloration: Right 1.5 cm x 1.5 cm plantar ulceration with pink granulation tissue and small amount of fibrinous exudate, well-healed TMA; extensive malodorous Left leg wounds with overlying eschar except large and tender posterior calf wound with pink exposed tissues without overlying eschar -- Extremities: B/L UE FROM, hands and feet warm, no edema  Neurologic:  -- Motor function: limited, but symmetric -- Sensation: unable to identify laterality of sensation with touch to level of mid-calf  Pulse/Doppler Exam: (p=palpable; d=doppler signals; 0=none)     Right   Left   AVF      Non-pulsatile thrill  Rad  p   p   Fem  p   p   Pop  Non-palp Non-palp  DP  Non-palp  Non-palp   PT  Non-palp  Non-palp   Labs:  CBC Latest Ref Rng & Units Oct 15, 2016 02/22/2015 02/21/2015  WBC 3.8 - 10.6 K/uL 6.6 3.4(L) 3.1(L)  Hemoglobin 13.0 -  18.0 g/dL 10.0(L) 11.0(L) 10.7(L)  Hematocrit 40.0 - 52.0 % 28.9(L) 33.0(L) 32.5(L)  Platelets 150 - 440 K/uL 189 116(L) 113(L)   CMP Latest Ref Rng & Units 10/07/16 02/22/2015 02/21/2015  Glucose 65 - 99 mg/dL 40(JW) 88 75  BUN 6 - 20 mg/dL 15 11(B) 14(N)  Creatinine 0.61 - 1.24 mg/dL 8.29(F) 6.21(H) 0.86(V)  Sodium 135 - 145 mmol/L 133(L) 135 137  Potassium 3.5 - 5.1 mmol/L 2.6(LL) 6.5(H) 4.7  Chloride 101 - 111 mmol/L 98(L) 101 100(L)  CO2 22 - 32 mmol/L 27 21(L) 24  Calcium 8.9 - 10.3 mg/dL 7.8(I) 6.9(L) 7.4(L)  Total Protein 6.5 - 8.1 g/dL 7.2 - -  Total Bilirubin 0.3 - 1.2 mg/dL 6.9(G) - -  Alkaline Phos 38 - 126 U/L 124 - -  AST 15 - 41 U/L 45(H) - -  ALT 17 - 63 U/L 20 - -   Imaging studies:  Right Foot X-ray (October 07, 2016) Large soft tissue ulceration of the transmetatarsal soft tissue site medially. No radiopaque foreign body. Transmetatarsal amputation noted. No definite acute bony destruction, periostitis, or  fracture. Peripheral atherosclerosis noted. Bones are osteopenic.  CTA abdominal aorta and with runoff [facility], 07/29/2016) Right common iliac: Patent, mild atherosclerotic disease. Right internal iliac: Patent, mild atherosclerotic disease. Right external iliac: Patent., Mild atherosclerotic disease. Right common femoral: Patent with mild atherosclerotic disease. Right superficial femoral artery: Patent with extensive mural calcification. Multifocal mild stenoses. Right profunda: Patent with extensive mural calcification.  Right popliteal artery: Patent with moderate atherosclerotic disease.  Right anterior tibial artery: Severe atherosclerotic disease with multifocal areas of occlusion along its course. The dorsalis pedis artery is patent. Right tibioperoneal trunk: Coarse mural calcification with high-grade stenosis. Right posterior tibial artery: Diffuse mural calcification limiting evaluation. Likely occludes mid calf. Right peroneal artery: Diffuse mural calcification limiting evaluation. Appears patent to the ankle.  Left common iliac: Patent, mild atherosclerotic disease. Left internal iliac: Patent with extensive mural calcifications. Left external iliac: Patent. Mild atherosclerotic disease. Left common femoral: Patent. Left superficial femoral artery: Patent with mild to moderate atherosclerotic disease. Left profunda: Patent with mild to moderate atherosclerotic disease. Left popliteal artery: Patent with moderate to severe narrowing just proximal to the origin of the anterior tibial artery. Left anterior tibial artery: Extensive mural calcification limiting evaluation. Occluded proximally with reconstitution in the upper calf. Dorsalis pedis artery is patent. Left posterior tibial artery: Occluded from the origin to the foot. The medial and lateral plantar branches are patent. Left peroneal artery: Extensive mural calcification. Patent with multifocal severe  stenoses.  Left great toe pressure ([facility], date unclear, but ~7-08/2016): 54 mmHg   Assessment/Plan: (ICD-10's: L97.909, I73.9) 66 y.o. male with chronic LLE > RLE wounds and B/L lower extremity PAD (LLE specifically not amenable to revascularization per [facility] vascular surgery), complicated by pertinent comorbidities including DM, HTN, CAD with CHF, ESRD on HD, PAD, and documented medical non-compliance.   - consult vascular surgery  - may consider repeat LLE digital pressures +/- TCPO2, though value unclear  - high risk for AKA(s) if life-threatening infection or uncontrollable pain considering non-ambulatory status with poor arterial runoff  - medical management of comorbidities as per primary medical team  All of the above findings and recommendations were discussed with the patient and his RN, and all of patient's questions were answered to his expressed satisfaction.  Thank you for the opportunity to participate in this patient's care.   -- Scherrie Gerlach Earlene Plater, MD, RPVI [place]: [facility]  Surgery - Partnering for exceptional care. Office: [phone]

## 2016-09-24 NOTE — Progress Notes (Signed)
Hypoglycemic Event  CBG: 45  Treatment: 25ml of D 50 per hypoglycemia protocol, D10 @ 75  Symptoms: Drowsiness  Follow-up CBG: Time:1745 CBG Result:104  Possible Reasons for Event: Patient BG has been dropping since arriving in the ED.  Comments/MD notified:Dr. Imogene Burn notified, Rn to place order for q2 fingersticks.     Fay Records

## 2016-09-24 NOTE — Progress Notes (Addendum)
Pharmacy Antibiotic Note  Travis [name] is a 66 y.o. male admitted on 11-Oct-2016 with cellulitis.  Pharmacy has been consulted for vancomycin and piperacillin/tazobactam dosing.  Patient has ESRD and is on HD.  Plan: Vancomycin 1750 mg loading dose now. Pharmacy to follow HD schedule and order vancomycin 750 mg IV with HD session. Goal VT 10-20 mcg/mL  One dose of cefepime was ordered in ED. Being changed to piparacillin/tazobactam on admission. Will order piperacillin/tazobactam 3.375 g IV q12h EI.  Weight: 176 lb 5.9 oz (80 kg)  Temp (24hrs), Avg:98.7 F (37.1 C), Min:98.7 F (37.1 C), Max:98.7 F (37.1 C)   Recent Labs Lab 2016/10/11 1221  WBC 6.6  CREATININE 3.00*  LATICACIDVEN 1.3    CrCl cannot be calculated (Unknown ideal weight.).    No Known Allergies  Antimicrobials this admission: vancomycin 9/19 >>  Piperacillin/tazobactam 9/19 >>  Cefepime dose in ED 9/19  Dose adjustments this admission:  Microbiology results: 9/19 BCx: Sent  Thank you for allowing pharmacy to be a part of this patient's care.  Cindi Carbon, PharmD, BCPS Clinical Pharmacist October 11, 2016 3:31 PM

## 2016-09-24 NOTE — ED Notes (Signed)
Dr. Pershing Proud aware of Troponin of 0.11.

## 2016-09-24 NOTE — ED Notes (Signed)
Dr. Imogene Burn states patient can go to the floor with BS of 86 and doesn't need the 2nd D50 ampule

## 2016-09-24 NOTE — ED Triage Notes (Signed)
Per EMS report, Patient was at dialysis today and staff called EMS due to that patient's bilateral lower extremities wounds that have been present for two months.

## 2016-09-24 NOTE — ED Notes (Signed)
2nd IV and 2nd blood culture not needed per Dr. Pershing Proud.

## 2016-09-24 NOTE — Progress Notes (Signed)
Notified Dr. Cherlynn Kaiser that patient had blood glucose drop again and pushed dextrose per hypoglycemia protocol. Per Dr. Cherlynn Kaiser no medical reason to transfer pt to stepdown at this time. Will continue to monitor with q 2 fingersticks. AC notified and will come check on the patient later. Nurse tech to attempt to get patient to eat.

## 2016-09-25 ENCOUNTER — Inpatient Hospital Stay: Payer: Medicare Other

## 2016-09-25 DIAGNOSIS — M79605 Pain in left leg: Secondary | ICD-10-CM

## 2016-09-25 DIAGNOSIS — E11621 Type 2 diabetes mellitus with foot ulcer: Secondary | ICD-10-CM

## 2016-09-25 DIAGNOSIS — N186 End stage renal disease: Secondary | ICD-10-CM

## 2016-09-25 DIAGNOSIS — R6521 Severe sepsis with septic shock: Secondary | ICD-10-CM

## 2016-09-25 DIAGNOSIS — I96 Gangrene, not elsewhere classified: Secondary | ICD-10-CM

## 2016-09-25 DIAGNOSIS — A419 Sepsis, unspecified organism: Principal | ICD-10-CM

## 2016-09-25 DIAGNOSIS — M79604 Pain in right leg: Secondary | ICD-10-CM

## 2016-09-25 LAB — BASIC METABOLIC PANEL
Anion gap: 10 (ref 5–15)
BUN: 22 mg/dL — AB (ref 6–20)
CALCIUM: 8 mg/dL — AB (ref 8.9–10.3)
CO2: 24 mmol/L (ref 22–32)
Chloride: 97 mmol/L — ABNORMAL LOW (ref 101–111)
Creatinine, Ser: 3.87 mg/dL — ABNORMAL HIGH (ref 0.61–1.24)
GFR calc Af Amer: 17 mL/min — ABNORMAL LOW (ref >60)
GFR, EST NON AFRICAN AMERICAN: 15 mL/min — AB (ref >60)
GLUCOSE: 61 mg/dL — AB (ref 65–99)
Potassium: 3.5 mmol/L (ref 3.5–5.1)
Sodium: 131 mmol/L — ABNORMAL LOW (ref 135–145)

## 2016-09-25 LAB — TROPONIN I
TROPONIN I: 1.43 ng/mL — AB (ref <0.03)
TROPONIN I: 1.27 ng/mL — AB (ref <0.03)

## 2016-09-25 LAB — GLUCOSE, CAPILLARY
GLUCOSE-CAPILLARY: 78 mg/dL (ref 65–99)
GLUCOSE-CAPILLARY: 103 mg/dL — AB (ref 65–99)
GLUCOSE-CAPILLARY: 86 mg/dL (ref 65–99)
GLUCOSE-CAPILLARY: 127 mg/dL — AB (ref 65–99)
Glucose-Capillary: 28 mg/dL — CL (ref 65–99)
Glucose-Capillary: 34 mg/dL — CL (ref 65–99)
Glucose-Capillary: 52 mg/dL — ABNORMAL LOW (ref 65–99)
Glucose-Capillary: 86 mg/dL (ref 65–99)
Glucose-Capillary: 100 mg/dL — ABNORMAL HIGH (ref 65–99)
Glucose-Capillary: 100 mg/dL — ABNORMAL HIGH (ref 65–99)
Glucose-Capillary: 105 mg/dL — ABNORMAL HIGH (ref 65–99)
Glucose-Capillary: 106 mg/dL — ABNORMAL HIGH (ref 65–99)
Glucose-Capillary: 107 mg/dL — ABNORMAL HIGH (ref 65–99)

## 2016-09-25 LAB — LACTIC ACID, PLASMA
LACTIC ACID, VENOUS: 1.6 mmol/L (ref 0.5–1.9)
Lactic Acid, Venous: 1.7 mmol/L (ref 0.5–1.9)

## 2016-09-25 LAB — CBC
HEMATOCRIT: 27.3 % — AB (ref 40.0–52.0)
Hemoglobin: 9.5 g/dL — ABNORMAL LOW (ref 13.0–18.0)
MCH: 35.2 pg — ABNORMAL HIGH (ref 26.0–34.0)
MCHC: 34.7 g/dL (ref 32.0–36.0)
MCV: 101.3 fL — ABNORMAL HIGH (ref 80.0–100.0)
Platelets: 151 10*3/uL (ref 150–440)
RBC: 2.7 MIL/uL — ABNORMAL LOW (ref 4.40–5.90)
RDW: 16 % — AB (ref 11.5–14.5)
WBC: 9.5 10*3/uL (ref 3.8–10.6)

## 2016-09-25 LAB — PROCALCITONIN: PROCALCITONIN: 21.06 ng/mL

## 2016-09-25 LAB — BILIRUBIN, TOTAL: Total Bilirubin: 2.5 mg/dL — ABNORMAL HIGH (ref 0.3–1.2)

## 2016-09-25 MED ORDER — MUPIROCIN 2 % EX OINT
1.0000 "application " | TOPICAL_OINTMENT | Freq: Two times a day (BID) | CUTANEOUS | Status: AC
  Administered 2016-09-25 – 2016-09-29 (×9): 1 via NASAL
  Filled 2016-09-25 (×2): qty 22

## 2016-09-25 MED ORDER — RENA-VITE PO TABS
1.0000 | ORAL_TABLET | Freq: Every day | ORAL | Status: DC
  Administered 2016-09-27 – 2016-09-29 (×3): 1 via ORAL
  Filled 2016-09-25 (×3): qty 1

## 2016-09-25 MED ORDER — DEXTROSE 10 % IV SOLN
INTRAVENOUS | Status: DC
  Administered 2016-09-26 – 2016-10-01 (×6): via INTRAVENOUS

## 2016-09-25 MED ORDER — PROMETHAZINE HCL 25 MG/ML IJ SOLN
12.5000 mg | Freq: Four times a day (QID) | INTRAMUSCULAR | Status: DC | PRN
  Administered 2016-09-25: 12.5 mg via INTRAVENOUS
  Filled 2016-09-25: qty 1

## 2016-09-25 MED ORDER — SENNOSIDES-DOCUSATE SODIUM 8.6-50 MG PO TABS: 1.0000 | ORAL_TABLET | Freq: Every evening | ORAL | Status: DC | PRN

## 2016-09-25 MED ORDER — ENSURE ENLIVE PO LIQD
237.0000 mL | Freq: Two times a day (BID) | ORAL | Status: DC
  Administered 2016-09-25 – 2016-09-28 (×4): 237 mL via ORAL

## 2016-09-25 MED ORDER — GLUCAGON HCL RDNA (DIAGNOSTIC) 1 MG IJ SOLR
1.0000 mg | Freq: Once | INTRAMUSCULAR | Status: AC | PRN
  Administered 2016-09-25: 1 mg via INTRAVENOUS
  Filled 2016-09-25: qty 1

## 2016-09-25 MED ORDER — CHLORHEXIDINE GLUCONATE CLOTH 2 % EX PADS
6.0000 | MEDICATED_PAD | Freq: Every day | CUTANEOUS | Status: AC
  Administered 2016-09-26 – 2016-09-30 (×4): 6 via TOPICAL

## 2016-09-25 MED ORDER — SODIUM CHLORIDE 0.9 % IV SOLN
0.0000 ug/min | INTRAVENOUS | Status: DC
  Administered 2016-09-25: 20 ug/min via INTRAVENOUS
  Filled 2016-09-25: qty 10
  Filled 2016-09-25 (×2): qty 1

## 2016-09-25 MED ORDER — SODIUM CHLORIDE 0.9 % IV BOLUS (SEPSIS)
250.0000 mL | Freq: Once | INTRAVENOUS | Status: AC
  Administered 2016-09-25: 250 mL via INTRAVENOUS

## 2016-09-25 NOTE — Progress Notes (Signed)
Spoke with Shay from lab [facility] unable to print label. She stated to send specimen with chart label.

## 2016-09-25 NOTE — Progress Notes (Signed)
Initial Nutrition Assessment  DOCUMENTATION CODES:   Not applicable  INTERVENTION:   Recommend check Phosphorus; pt at high refeeding risk  Recommend check BNP; pt with elevated BNP in February   Recommend Omega 3 fatty acid supplement daily to encourage wound healing  Ensure Enlive po BID, each supplement provides 350 kcal and 20 grams of protein- Provides source of zinc to encourage wound healing  Renal MVI  Daily weights  NUTRITION DIAGNOSIS:   Increased nutrient needs related to wound healing, other (see comment) (ESRD on HD), severe sepsis as evidenced by increased estimated needs from protein.  GOAL:   Patient will meet greater than or equal to 90% of their needs  MONITOR:   PO intake, Supplement acceptance, Labs, Weight trends, I & O's, Skin  REASON FOR ASSESSMENT:   Malnutrition Screening Tool    ASSESSMENT:   66 y.o. male with a known history of CHF, CAD, ESRD on HD, DM, osteomyelitis. The patient was sent from hemodialysis center to the ED due to worsening leg infection and hypoglycemia. The patient complains of bilateral leg pain, worsening wound infection and discharge.    Met with pt in room today; interpreter was used. Pt reports poor appetite and oral intake for 2 months pta. Pt believes that his poor appetite is related to his legs being painful and infected. Pt reports a 15lb weight loss but per chart, pt has been weight stable for the past two months. Pt did have previous wt loss of 13lbs(7%) over the past 7 months but this is not significant. Pt with moderate muscle wasting in his BLE; pt reports that he is not able to move around and that he is wheelchair bound at home. Unable to visualize the wounds on pt's legs r/t bandages; wound care examined pt today. Patient is followed by [facility] wound clinic. Pt reports nausea today and  vomitng once. Pt does report that he has an appetite today. Pt NPO overnight for AUS but has now been initiated on a diet. Pt with  hypotension and hypoglycemia. Per MD note, pt with severe sepsis. Pt on 10% Dextrose infusion. Pt to possibly transfer to CCU. Recommend check Phosphorus as pt with ESRD on HD and at risk for refeeding. Last HD was 9/19. Also recommend check BNP; pt's BNP was 2503 in February. Pt is at risk for potential fluid overload. RD will order supplements and MVI to encourage wound healing; pt would benefit from Omega 3 fatty acids as well.    Medications reviewed and include: aspirin, heparin, insulin, Vit D, zosyn, dextrose 10% '@40ml' /hr, zofran, phenergan    Labs reviewed: Na 131(L), K 3.5 wnl, Cl 97(L), BUN 22(H), creat 3.87(H), Ca 8.0(L) adj. 9.36 wnl, Mg 1.8 wnl, alb 2.3(L), AST 45(H), tbili 2.5(H) Hgb 9.5(L), Hct 27.3(L) cbgs- 37, 61 x 24 hrs AIC 5.3 9/19  MAP- <87mHg  Nutrition-Focused physical exam completed. Findings are no fat depletion, moderate muscle depletions in BLE, and no edema. Right transmetatarsal amputation. Unable to visualize wounds r/t bandages.    Diet Order:  Diet Carb Modified Fluid consistency: Thin; Room service appropriate? Yes  Skin:  Wound (see comment) (Right 1.5 cm x 1.5 cm plantar ulceration with pink granulation tissue and small amount of fibrinous exudate, well-healed TMA; extensive malodorous Left leg wounds with overlying eschar except large and tender posterior calf wound with pink exposed tissue), Stage II sacrum, Stage III calf  Last BM:  9/19  Height:   Ht Readings from Last 1 Encounters:  09/25/16  '5\' 6"'  (1.676 m)    Weight:   Wt Readings from Last 1 Encounters:  09/20/2016 176 lb 5.9 oz (80 kg)    Ideal Body Weight:  64.5 kg  BMI:  Body mass index is 28.47 kg/m.  Estimated Nutritional Needs:   Kcal:  1800-2100kcal/day   Protein:  96-112g/day   Fluid:  >1.8L/day   EDUCATION NEEDS:   Education needs addressed  Koleen Distance MS, RD, LDN Pager #[phone] After Hours Pager: [phone]

## 2016-09-25 NOTE — Consult Note (Signed)
[name] Vascular Consult Note  MRN : [id number]  Travis [name] is a 66 y.o. ([birth year]) male who presents with chief complaint of  Chief Complaint  Patient presents with  . Leg Pain  .  History of Present Illness: I am asked to see the patient by Dr. Juliene Pina for LE wounds.  The patient has chronic bilateral lower extremity wounds and has been following at the wound care center at [name] now for months. He has a long history of multiple medical issues and according to the [name] physicians non-reconstructable vascular disease. His right lower extremity wounds and been the more problematic wounds. He has about a 2 cm neuropathic ulcer on the base of the right foot. He has already undergone right transmetatarsal amputation. He has some contractures and does not walk. He has a large superficial wound on the posterior aspect of the right calf as well as the left calf. There was some cellulitis on admission although they're not particularly erythematous today. There is no purulent drainage or exudate. Cultures have been sent. The patient was admitted for possible sepsis with hypotension. His lactate is normal. His white blood cell count was 9. These are only mildly painful.  Current Facility-Administered Medications  Medication Dose Route Frequency Provider Last Rate Last Dose  . 0.9 %  sodium chloride infusion  250 mL Intravenous PRN Shaune Pollack, MD      . acetaminophen (TYLENOL) tablet 650 mg  650 mg Oral Q6H PRN Shaune Pollack, MD   650 mg at 09/16/2016 2030   Or  . acetaminophen (TYLENOL) suppository 650 mg  650 mg Rectal Q6H PRN Shaune Pollack, MD      . albuterol (PROVENTIL) (2.5 MG/3ML) 0.083% nebulizer solution 2.5 mg  2.5 mg Nebulization Q2H PRN Shaune Pollack, MD      . aspirin EC tablet 81 mg  81 mg Oral Daily Shaune Pollack, MD   81 mg at 09/16/2016 1849  . [START ON 09/26/2016] Chlorhexidine Gluconate Cloth 2 % PADS 6 each  6 each Topical Q0600 Mody, Sital, MD      . dextrose  10 % infusion   Intravenous Continuous Lateef, Munsoor, MD 40 mL/hr at 09/25/16 1115    . feeding supplement (ENSURE ENLIVE) (ENSURE ENLIVE) liquid 237 mL  237 mL Oral BID BM Mody, Sital, MD   237 mL at 09/25/16 1433  . heparin injection 5,000 Units  5,000 Units Subcutaneous Q8H Shaune Pollack, MD   5,000 Units at 09/25/16 1433  . HYDROcodone-acetaminophen (NORCO/VICODIN) 5-325 MG per tablet 1-2 tablet  1-2 tablet Oral Q4H PRN Shaune Pollack, MD   2 tablet at 09/25/16 0346  . multivitamin (RENA-VIT) tablet 1 tablet  1 tablet Oral QHS Mody, Sital, MD      . mupirocin ointment (BACTROBAN) 2 % 1 application  1 application Nasal BID Adrian Saran, MD      . ondansetron (ZOFRAN) tablet 4 mg  4 mg Oral Q6H PRN Shaune Pollack, MD       Or  . ondansetron [facility]) injection 4 mg  4 mg Intravenous Q6H PRN Shaune Pollack, MD   4 mg at 09/25/16 1726  . phenylephrine (NEO-SYNEPHRINE) 10 mg in sodium chloride 0.9 % 250 mL (0.04 mg/mL) infusion  0-400 mcg/min Intravenous Titrated Eugenie Norrie, NP   Stopped at 09/25/16 1447  . piperacillin-tazobactam (ZOSYN) IVPB 3.375 g  3.375 g Intravenous Q12H Cindi Carbon, [facility]   Stopped at 09/25/16 1436  . senna-docusate (Senokot-S)  tablet 1 tablet  1 tablet Oral QHS PRN Eugenie Norrie, NP      . sodium chloride flush (NS) 0.9 % injection 3 mL  3 mL Intravenous Q12H Shaune Pollack, MD   3 mL at 09/25/16 1036  . sodium chloride flush (NS) 0.9 % injection 3 mL  3 mL Intravenous PRN Shaune Pollack, MD      . Melene Muller ON 09/30/2016] Vitamin D (Ergocalciferol) (DRISDOL) capsule 50,000 Units  50,000 Units Oral Q7 days Shaune Pollack, MD        Past Medical History:  Diagnosis Date  . CHF (congestive heart failure) (HCC)   . Chronic kidney disease   . Coronary artery disease   . Diabetes mellitus without complication (HCC)   . Dialysis patient (HCC)   . Osteomyelitis [facility])     Past Surgical History:  Procedure Laterality Date  . A/V FISTULAGRAM Left 03/03/2016   Procedure: A/V Fistulagram;   Surgeon: Annice Needy, MD;  Location: [facility] INVASIVE CV LAB;  Service: Cardiovascular;  Laterality: Left;  . A/V SHUNT INTERVENTION N/A 03/03/2016   Procedure: A/V Shunt Intervention;  Surgeon: Annice Needy, MD;  Location: [facility] INVASIVE CV LAB;  Service: Cardiovascular;  Laterality: N/A;  . ARTERIAL THROMBECTOMY    . CARDIAC SURGERY    . FOOT SURGERY Right   . VASCULAR SURGERY      Social History Social History  Substance Use Topics  . Smoking status: Former Games developer  . Smokeless tobacco: Never Used  . Alcohol use No  No IV drug use  Family History No bleeding disorders, clotting disorders, autoimmune diseases, or aneurysms  No Known Allergies   REVIEW OF SYSTEMS (Negative unless checked)  Constitutional: Weight loss  Fever  Chills Cardiac: Chest pain   Chest pressure   Palpitations   Shortness of breath when laying flat   Shortness of breath at rest   Shortness of breath with exertion. Vascular:  Pain in legs with walking   Pain in legs at rest   Pain in legs when laying flat   Claudication   Pain in feet when walking  Pain in feet at rest  Pain in feet when laying flat   History of DVT   Phlebitis   Swelling in legs   Varicose veins   Non-healing ulcers Pulmonary:   Uses home oxygen   Productive cough   Hemoptysis   Wheeze  COPD   Asthma Neurologic:  Dizziness  Blackouts   Seizures   History of stroke   History of TIA  Aphasia   Temporary blindness   Dysphagia   Weakness or numbness in arms   Weakness or numbness in legs Musculoskeletal:  Arthritis   Joint swelling   Joint pain   Low back pain Hematologic:  Easy bruising  Easy bleeding   Hypercoagulable state   Anemic  Hepatitis Gastrointestinal:  Blood in stool   Vomiting blood  Gastroesophageal reflux/heartburn   Difficulty swallowing. Genitourinary:  Chronic kidney disease   Difficult urination  Frequent urination   Burning with urination   Blood in urine Skin:  Rashes   Ulcers   Wounds Psychological:  History of anxiety    History of major depression.  Physical Examination  Vitals:   09/25/16 0802 09/25/16 1027 09/25/16 1344 09/25/16 1410  BP: (!) 82/48  (!) 75/46   Pulse: 68  75   Resp:   16   Temp:   97.8 F (36.6 C) 97.8 F (36.6 C)  TempSrc:  Oral Oral  SpO2: 97%  99%   Weight:    82.6 kg (182 lb 1.6 oz)  Height:   (1.676 m)   (1.676 m)   Body mass index is 29.39 kg/m. Gen:  WD/WN, NAD Head: [place]/AT, No temporalis wasting.  Ear/Nose/Throat: Hearing grossly intact, nares w/o erythema or drainage, oropharynx w/o Erythema/Exudate Eyes: Sclera non-icteric, conjunctiva clear Neck: Trachea midline.  No JVD.  Pulmonary:  Good air movement, respirations not labored, equal bilaterally.  Cardiac: RRR, no JVD Vascular:  Vessel Right Left  Radial Palpable Palpable                          PT Not Palpable Not Palpable  DP Not Palpable Not Palpable    Musculoskeletal: Diffuse weakness worse on the right and the left. Contractures are present. Open ulcerations on the calves posteriorly minimal surrounding erythema. These appear superficial. The base of the right foot has about a 2 cm circular ulceration below a healed transmetatarsal amputation. No significant surrounding erythema or. Drainage Neurologic: Sensation grossly intact in extremities.  Contractures are present worse on the right than the left. Rigidity is also present. Psychiatric: Judgment intact, Mood & affect appropriate for pt's clinical situation. Dermatologic: Wounds as described above      CBC Lab Results  Component Value Date   WBC 9.5 09/25/2016   HGB 9.5 (L) 09/25/2016   HCT 27.3 (L) 09/25/2016   MCV 101.3 (H) 09/25/2016   PLT 151 09/25/2016    BMET    Component Value Date/Time   NA 131 (L) 09/25/2016 0338   NA 131 (L) 01/24/2013 1142   K 3.5 09/25/2016 0338   K 4.1 01/24/2013  1142   CL 97 (L) 09/25/2016 0338   CL 97 (L) 01/24/2013 1142   CO2 24 09/25/2016 0338   CO2 28 01/24/2013 1142   GLUCOSE 61 (L) 09/25/2016 0338   GLUCOSE 107 (H) 01/24/2013 1142   BUN 22 (H) 09/25/2016 0338   BUN 28 (H) 01/24/2013 1142   CREATININE 3.87 (H) 09/25/2016 0338   CREATININE 4.72 (H) 01/24/2013 1142   CALCIUM 8.0 (L) 09/25/2016 0338   CALCIUM 8.5 01/24/2013 1142   GFRNONAA 15 (L) 09/25/2016 0338   GFRNONAA 12 (L) 01/24/2013 1142   GFRAA 17 (L) 09/25/2016 0338   GFRAA 14 (L) 01/24/2013 1142   Estimated Creatinine Clearance: 18.7 mL/min (A) (by C-G formula based on SCr of 3.87 mg/dL (H)).  COAG Lab Results  Component Value Date   INR 1.32 02/21/2015   INR 1.1 10/03/2012   INR 1.0 10/01/2012    Radiology Dg Chest Port 1 View  Result Date: 09/25/2016 CLINICAL DATA:  Cough. History of diabetes and congestive heart failure. Former smoker. EXAM: PORTABLE CHEST 1 VIEW COMPARISON:  None. FINDINGS: Postoperative changes in the mediastinum. Cardiac enlargement with mild vascular congestion. No edema or consolidation. Small left pleural effusion with atelectasis or infiltration in the left lung base. Can't exclude pneumonia. Calcified and tortuous aorta. IMPRESSION: Cardiac enlargement with mild vascular congestion. No edema. Small left pleural effusion with left basilar infiltration or atelectasis. Electronically Signed   By: Burman Nieves M.D.   On: 09/25/2016 01:55   Dg Foot Complete Right  Result Date: 2016/10/01 CLINICAL DATA:  Diabetes, transmetatarsal amputation, soft tissue ulceration EXAM: RIGHT FOOT COMPLETE - 3+ VIEW COMPARISON:  01/02/2009 FINDINGS: Large soft tissue ulceration of the transmetatarsal soft tissue site medially. No radiopaque foreign body. Transmetatarsal amputation noted.  No definite acute bony destruction, periostitis, or fracture. Peripheral atherosclerosis noted. Bones are osteopenic. IMPRESSION: Large soft tissue ulceration of the transmetatarsal  amputation soft tissues. No underlying acute or abnormal osseous finding by plain radiography. Peripheral atherosclerosis Electronically Signed   By: Judie Petit.  Shick M.D.   On: 09/29/2016 14:04   US Abdomen Limited Ruq  Result Date: 09/25/2016 CLINICAL DATA:  Elevated bilirubin, vomiting EXAM: ULTRASOUND ABDOMEN LIMITED RIGHT UPPER QUADRANT COMPARISON:  None. FINDINGS: Gallbladder: Gallbladder is distended. Sludge noted within the gallbladder. No gallbladder wall thickening or sonographic Murphy sign. Common bile duct: Diameter: Normal caliber, 3 mm Liver: Nodular contours of the liver compatible with cirrhosis. No focal hepatic abnormality. No intrahepatic biliary duct dilatation. Portal vein is patent on color Doppler imaging with normal direction of blood flow towards the liver. IMPRESSION: Mild gallbladder distention with sludge noted. No visible stones, wall thickening or sonographic Murphy's sign. Changes of cirrhosis.  No focal hepatic abnormality. Electronically Signed   By: Charlett Nose M.D.   On: 09/25/2016 10:00      Assessment/Plan 1. Multiple nonhealing ulcerations with non-reconstructable vascular disease worse on the right and the left. In a nonambulatory contract patient, consideration for above-knee amputation and certainly reasonable. He has been following with the vascular surgeons in the wound care center at [facility] and it does not sound like they feel like he has much potential for revascularization. They have been doing local wound care with some success. At current, these did not appear grossly infected although there certainly possible source of his systemic infection. No role for debridement or local procedures at this point. Given the notes from [facility] wound care and vascular surgery I do not see a role for angiogram at this point either. If at any point the patient desires to have an amputation, we are happy to do this or he can certainly follow back up with his vascular and wound physicians  at [facility] and they would be capable of doing this as well. 2. End-stage renal disease. Certainly worsens his wound healing potential and increases his vascular disease. 3. Diabetes. blood glucose control important in reducing the progression of atherosclerotic disease. Also, involved in wound healing. On appropriate medications.    Festus Barren, MD  09/25/2016 5:31 PM    This note was created with Dragon medical transcription system.  Any error is purely unintentional

## 2016-09-25 NOTE — Progress Notes (Signed)
[employer] [facility] [facility]   PATIENT NAME: Travis [name]    MR#:  [id number]  DATE OF BIRTH:  08-26-1950  SUBJECTIVE:   Patient here with b/l LE wounds and had low blood sugars yesterday Patient with dizziness this am not feeling well  Had HD yesterday  REVIEW OF SYSTEMS:    Review of Systems  Constitutional: ++ fevers and chills + dizziness +lightheadness not feeling well  HENT: Negative for ear pain, nosebleeds, congestion, facial swelling, rhinorrhea, neck pain, neck stiffness and ear discharge.   Respiratory: Negative for cough, shortness of breath, wheezing  Cardiovascular: Negative for chest pain, palpitations and leg swelling.  Gastrointestinal: Negative for heartburn, abdominal pain, vomiting, diarrhea or consitpation Genitourinary: Negative for dysuria, urgency, frequency, hematuria Musculoskeletal: Negative for back pain or joint pain Neurological: Negative for dizziness, seizures, syncope, focal weakness,  numbness and headaches.  Hematological: Does  bruise/bleed easily.  Psychiatric/Behavioral: Negative for hallucinations, confusion, dysphoric mood SKIN: b/l wound infections   Tolerating Diet: NPO      DRUG ALLERGIES:  No Known Allergies  VITALS:  Blood pressure (!) 82/48, pulse 68, temperature 99.1 F (37.3 C), temperature source Oral, resp. rate 20, height  (1.676 m), weight 80 kg (176 lb 5.9 oz), SpO2 97 %.  PHYSICAL EXAMINATION:  Constitutional: Appears very ill appearing HENT: Normocephalic. Marland Kitchen Oropharynx is clear and moist.  Eyes: Conjunctivae and EOM are normal. PERRLA, no scleral icterus.  Neck: Normal ROM. Neck supple. No JVD. No tracheal deviation. CVS: RRR, S1/S2 +, no murmurs, no gallops, no carotid bruit.  Pulmonary: Effort and breath sounds normal, no stridor, rhonchi, wheezes, rales.  Abdominal: Soft. BS +,  no distension, tenderness, rebound or guarding.  Musculoskeletal: Normal range of motion. No edema and  no tenderness.  Neuro: Alert. CN 2-12 grossly intact. No focal deficits. Skin: b/l LE ulcerations Psychiatric: Normal mood and affect.      LABORATORY PANEL:   CBC  Recent Labs Lab 09/25/16 0338  WBC 9.5  HGB 9.5*  HCT 27.3*  PLT 151   ------------------------------------------------------------------------------------------------------------------  Chemistries   Recent Labs Lab 09/22/2016 1221 09/25/16 0338  NA 133* 131*  K 2.6* 3.5  CL 98* 97*  CO2 27 24  GLUCOSE 37* 61*  BUN 15 22*  CREATININE 3.00* 3.87*  CALCIUM 8.5* 8.0*  MG 1.8  --   AST 45*  --   ALT 20  --   ALKPHOS 124  --   BILITOT 1.9* 2.5*   ------------------------------------------------------------------------------------------------------------------  Cardiac Enzymes  Recent Labs Lab 09/25/2016 1221  TROPONINI 0.11*   ------------------------------------------------------------------------------------------------------------------  RADIOLOGY:  Dg Chest Port 1 View  Result Date: 09/25/2016 CLINICAL DATA:  Cough. History of diabetes and congestive heart failure. Former smoker. EXAM: PORTABLE CHEST 1 VIEW COMPARISON:  None. FINDINGS: Postoperative changes in the mediastinum. Cardiac enlargement with mild vascular congestion. No edema or consolidation. Small left pleural effusion with atelectasis or infiltration in the left lung base. Can't exclude pneumonia. Calcified and tortuous aorta. IMPRESSION: Cardiac enlargement with mild vascular congestion. No edema. Small left pleural effusion with left basilar infiltration or atelectasis. Electronically Signed   By: Burman Nieves M.D.   On: 09/25/2016 01:55   Dg Foot Complete Right  Result Date: 09/25/2016 CLINICAL DATA:  Diabetes, transmetatarsal amputation, soft tissue ulceration EXAM: RIGHT FOOT COMPLETE - 3+ VIEW COMPARISON:  01/02/2009 FINDINGS: Large soft tissue ulceration of the transmetatarsal soft tissue site medially. No radiopaque foreign  body. Transmetatarsal amputation noted. No definite acute  bony destruction, periostitis, or fracture. Peripheral atherosclerosis noted. Bones are osteopenic. IMPRESSION: Large soft tissue ulceration of the transmetatarsal amputation soft tissues. No underlying acute or abnormal osseous finding by plain radiography. Peripheral atherosclerosis Electronically Signed   By: Judie Petit.  Shick M.D.   On: 2016/10/15 14:04   US Abdomen Limited Ruq  Result Date: 09/25/2016 CLINICAL DATA:  Elevated bilirubin, vomiting EXAM: ULTRASOUND ABDOMEN LIMITED RIGHT UPPER QUADRANT COMPARISON:  None. FINDINGS: Gallbladder: Gallbladder is distended. Sludge noted within the gallbladder. No gallbladder wall thickening or sonographic Murphy sign. Common bile duct: Diameter: Normal caliber, 3 mm Liver: Nodular contours of the liver compatible with cirrhosis. No focal hepatic abnormality. No intrahepatic biliary duct dilatation. Portal vein is patent on color Doppler imaging with normal direction of blood flow towards the liver. IMPRESSION: Mild gallbladder distention with sludge noted. No visible stones, wall thickening or sonographic Murphy's sign. Changes of cirrhosis.  No focal hepatic abnormality. Electronically Signed   By: Charlett Nose M.D.   On: 09/25/2016 10:00     ASSESSMENT AND PLAN:   66 y.o. male with chronic CAD, ESRD, PAD and non-healing B/L lower extremity wounds complicated by medical non-compliance presented to [facility] ED upon referral from hemodialysis for hypoglycemia and B/L lower extremity wounds   1. Severe Sepsis with hypotension, tachycardia and fevers from B?l LE wounds Patient will need to be transferred to ICU. Case discussed with intensivist. Patient needs resuscitation with IV fluids and possible pressors.  2. Bilateral lower extremity wounds: Patient is followed at [facility] wound clinic. He has extensive eschar on all wounds. Patient evaluated by surgery who is recommending consultation with vascular  surgery. Vascular surgery and podiatry consultation requested. Management as per wound care as follows To posterior right lower leg and anterior and posterior left lower leg wounds, cleanse wounds with NS, pat dry, apply large Xeroform, cover  wounds with ABDs, wrap with kerlix, adhere kerlix to kerlix, no tape on skin, perform daily and prn soiling. Prevalon boots to prevent foot ulcers. Foam dressing to sacral ulcer, change Q3D and prn soiling. Peel back and assess Q shift. Continue vancomycin and Zosyn ID consultation not available this weekend. Wound culture requested.  3. Hypoglycemia: Continue D10 drip Follow blood sugars every 2 hours.  4. ESRD on hemodialysis: Continue dialysis as per management by nephrology.  5. Chronic diastolic heart failure with preserved ejection fraction: Continue to monitor No signs of acute exacerbation at this time.  6. Hypokalemia: This has been repleted  7. Hyponatremia: This is slowly resolving  8. Anemia of chronic disease: Monitor hemoglobin  9. Elevated bilirubin: Abdominal ultrasound without evidence of acute abdominal pathology.  10. Peripheral vascular disease: Continue aspirin and Plavix  11. Essential hypertension due to severe sepsis with hypotension blood pressure medications discontinued for now  Patient is critically ill. Patient needs ICU. Case discussed with intensivist.   Management plans discussed with the patient and he is in agreement.  CODE STATUS: FULL  Critical careTOTAL TIME TAKING CARE OF THIS PATIENT: 45 minutes.     POSSIBLE D/C ??, DEPENDING ON CLINICAL CONDITION.   Twala Collings M.D on 09/25/2016 at 11:43 AM  Between 7am to 6pm - Pager - [phone] After 6pm go to [email or web address] - Social research officer, government  [facility] [place] [facility]  Office  [phone]  CC: Primary care physician; Center, [facility]  Note: This dictation was prepared with Nurse, children's dictation along with smaller phrase  technology. Any transcriptional errors that result from this process are unintentional.

## 2016-09-25 NOTE — Consult Note (Signed)
Reason for Consult: Ulceration right foot with history of diabetes and peripheral vascular disease Referring Physician: Mody  Travis [name] is an 66 y.o. male.  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 He has been under the care of wound care down at [facility]. Also has been followed at [facility] for his vascular status.  Past Medical History:  Diagnosis Date  . CHF (congestive heart failure) ([place])   . Chronic kidney disease   . Coronary artery disease   . Diabetes mellitus without complication ([place])   . Dialysis patient ([place])   . Osteomyelitis [facility])     Past Surgical History:  Procedure Laterality Date  . A/V FISTULAGRAM Left 03/03/2016   Procedure: A/V Fistulagram;  Surgeon: Algernon Huxley, MD;  Location: [place] [facility];  Service: Cardiovascular;  Laterality: Left;  . A/V SHUNT INTERVENTION N/A 03/03/2016   Procedure: A/V Shunt Intervention;  Surgeon: Algernon Huxley, MD;  Location: [place] [facility];  Service: Cardiovascular;  Laterality: N/A;  . ARTERIAL THROMBECTOMY    . CARDIAC SURGERY    . FOOT SURGERY Right   . VASCULAR SURGERY      No family history on file.  Social History:  reports that he has quit smoking. He has never used smokeless tobacco. He reports that he does not drink alcohol or use drugs.  Allergies: No Known Allergies  Medications:  Scheduled: . aspirin EC  81 mg Oral Daily  . dextrose  50 mL Intravenous STAT  . feeding supplement (ENSURE ENLIVE)  237 mL Oral BID BM  . heparin  5,000 Units Subcutaneous Q8H  . insulin aspart  0-9 Units Subcutaneous TID WC  . multivitamin  1 tablet Oral QHS  . sodium chloride flush  3 mL Intravenous Q12H  . [START ON 09/30/2016] Vitamin D (Ergocalciferol)  50,000 Units Oral Q7 days    Results for orders placed or performed during the hospital encounter of 09/27/2016 (from the past 48 hour(s))  Comprehensive metabolic panel     Status: Abnormal   Collection Time: 09/14/2016 12:21 PM   Result Value Ref Range   Sodium 133 (L) 135 - 145 mmol/L   Potassium 2.6 (LL) 3.5 - 5.1 mmol/L    Comment: CRITICAL RESULT CALLED TO, READ BACK BY AND VERIFIED WITH: SONJIA REEVA '@1305'$  ON 09/12/2016 BY HKP    Chloride 98 (L) 101 - 111 mmol/L   CO2 27 22 - 32 mmol/L   Glucose, Bld 37 (LL) 65 - 99 mg/dL    Comment: CRITICAL RESULT CALLED TO, READ BACK BY AND VERIFIED WITH: SONJIA REEVA '@1305'$  ON 09/20/2016 BY HKP    BUN 15 6 - 20 mg/dL   Creatinine, Ser 3.00 (H) 0.61 - 1.24 mg/dL   Calcium 8.5 (L) 8.9 - 10.3 mg/dL   Total Protein 7.2 6.5 - 8.1 g/dL   Albumin 2.3 (L) 3.5 - 5.0 g/dL   AST 45 (H) 15 - 41 U/L   ALT 20 17 - 63 U/L   Alkaline Phosphatase 124 38 - 126 U/L   Total Bilirubin 1.9 (H) 0.3 - 1.2 mg/dL   GFR calc non Af Amer 20 (L) >60 mL/min   GFR calc Af Amer 23 (L) >60 mL/min    Comment: (NOTE) The eGFR has been calculated using the CKD EPI equation. This calculation has not been validated in all clinical situations. eGFR's persistently <60 mL/min signify possible Chronic Kidney Disease.    Anion gap 8  5 - 15  CBC WITH DIFFERENTIAL     Status: Abnormal   Collection Time: 09/07/2016 12:21 PM  Result Value Ref Range   WBC 6.6 3.8 - 10.6 K/uL   RBC 2.88 (L) 4.40 - 5.90 MIL/uL   Hemoglobin 10.0 (L) 13.0 - 18.0 g/dL   HCT 29.7 (L) 10.2 - 46.8 %   MCV 100.2 (H) 80.0 - 100.0 fL   MCH 34.7 (H) 26.0 - 34.0 pg   MCHC 34.7 32.0 - 36.0 g/dL   RDW 38.8 (H) 30.3 - 21.9 %   Platelets 189 150 - 440 K/uL   Neutrophils Relative % 71 %   Neutro Abs 4.7 1.4 - 6.5 K/uL   Lymphocytes Relative 14 %   Lymphs Abs 0.9 (L) 1.0 - 3.6 K/uL   Monocytes Relative 10 %   Monocytes Absolute 0.7 0.2 - 1.0 K/uL   Eosinophils Relative 4 %   Eosinophils Absolute 0.3 0 - 0.7 K/uL   Basophils Relative 1 %   Basophils Absolute 0.0 0 - 0.1 K/uL  Blood Culture (routine x 2)     Status: None (Preliminary result)   Collection Time: 10/05/2016 12:21 PM  Result Value Ref Range   Specimen Description BLOOD  RIGHT AC    Special Requests      BOTTLES DRAWN AEROBIC AND ANAEROBIC Blood Culture adequate volume   Culture NO GROWTH < 24 HOURS    Report Status PENDING   Blood Culture (routine x 2)     Status: None (Preliminary result)   Collection Time: 09/21/2016 12:21 PM  Result Value Ref Range   Specimen Description BLOOD BLOOD RIGHT HAND    Special Requests      BOTTLES DRAWN AEROBIC AND ANAEROBIC Blood Culture results may not be optimal due to an inadequate volume of blood received in culture bottles   Culture NO GROWTH < 24 HOURS    Report Status PENDING   Lactic acid, plasma     Status: None   Collection Time: 10/01/2016 12:21 PM  Result Value Ref Range   Lactic Acid, Venous 1.3 0.5 - 1.9 mmol/L  Troponin I     Status: Abnormal   Collection Time: 09/20/2016 12:21 PM  Result Value Ref Range   Troponin I 0.11 (HH) <0.03 ng/mL    Comment: CRITICAL RESULT CALLED TO, READ BACK BY AND VERIFIED WITH SONJIA REEVA @1305  ON 09/20/2016 BY HKP   Hemoglobin A1c     Status: None   Collection Time: 09/26/2016 12:21 PM  Result Value Ref Range   Hgb A1c MFr Bld 5.3 4.8 - 5.6 %    Comment: (NOTE) Pre diabetes:          5.7%-6.4% Diabetes:              >6.4% Glycemic control for   <7.0% adults with diabetes    Mean Plasma Glucose 105.41 mg/dL    Comment: Performed at [facility], [address], [place], [place]  Magnesium     Status: None   Collection Time: 09/23/2016 12:21 PM  Result Value Ref Range   Magnesium 1.8 1.7 - 2.4 mg/dL  Glucose, capillary     Status: Abnormal   Collection Time: 09/27/2016 12:26 PM  Result Value Ref Range   Glucose-Capillary 27 (LL) 65 - 99 mg/dL  Glucose, capillary     Status: Abnormal   Collection Time: 10/01/2016 12:41 PM  Result Value Ref Range   Glucose-Capillary 28 (LL) 65 - 99 mg/dL  Comment 1 Repeat Test   Glucose, capillary     Status: Abnormal   Collection Time: 09/08/2016 12:44 PM  Result Value Ref Range   Glucose-Capillary 49 (L) 65 - 99 mg/dL   Glucose, capillary     Status: Abnormal   Collection Time: 09/23/2016  1:03 PM  Result Value Ref Range   Glucose-Capillary 38 (LL) 65 - 99 mg/dL   Comment 1 Document in Chart    Comment 2 Call MD NNP PA CNM   Glucose, capillary     Status: Abnormal   Collection Time: 10/03/2016  1:27 PM  Result Value Ref Range   Glucose-Capillary 178 (H) 65 - 99 mg/dL  Glucose, capillary     Status: None   Collection Time: 09/13/2016  2:14 PM  Result Value Ref Range   Glucose-Capillary 73 65 - 99 mg/dL  Glucose, capillary     Status: Abnormal   Collection Time: 09/20/2016  3:34 PM  Result Value Ref Range   Glucose-Capillary 39 (LL) 65 - 99 mg/dL   Comment 1 Notify RN    Comment 2 Document in Chart   Glucose, capillary     Status: Abnormal   Collection Time: 09/18/2016  3:35 PM  Result Value Ref Range   Glucose-Capillary 34 (LL) 65 - 99 mg/dL   Comment 1 Notify RN    Comment 2 Document in Chart   Glucose, capillary     Status: None   Collection Time: 10/03/2016  4:18 PM  Result Value Ref Range   Glucose-Capillary 86 65 - 99 mg/dL  Glucose, capillary     Status: Abnormal   Collection Time: 09/26/2016  5:17 PM  Result Value Ref Range   Glucose-Capillary 45 (L) 65 - 99 mg/dL   Comment 1 Notify RN   Glucose, capillary     Status: Abnormal   Collection Time: 10/05/2016  5:45 PM  Result Value Ref Range   Glucose-Capillary 104 (H) 65 - 99 mg/dL   Comment 1 Notify RN   Surgical PCR screen     Status: Abnormal   Collection Time: 09/13/2016  6:31 PM  Result Value Ref Range   MRSA, PCR POSITIVE (A) NEGATIVE    Comment: RESULT CALLED TO, READ BACK BY AND VERIFIED WITH: CRYSTAL BASS AT 2041 10/03/2016 BY TFK    Staphylococcus aureus POSITIVE (A) NEGATIVE    Comment: (NOTE) The Xpert SA Assay (FDA approved for NASAL specimens in patients 80 years of age and older), is one component of a comprehensive surveillance program. It is not intended to diagnose infection nor to guide or monitor treatment.   Glucose,  capillary     Status: Abnormal   Collection Time: 10/02/2016  6:48 PM  Result Value Ref Range   Glucose-Capillary 53 (L) 65 - 99 mg/dL  Glucose, capillary     Status: None   Collection Time: 09/18/2016  7:12 PM  Result Value Ref Range   Glucose-Capillary 96 65 - 99 mg/dL  Glucose, capillary     Status: None   Collection Time: 09/12/2016  8:00 PM  Result Value Ref Range   Glucose-Capillary 70 65 - 99 mg/dL  Glucose, capillary     Status: Abnormal   Collection Time: 09/21/2016  9:29 PM  Result Value Ref Range   Glucose-Capillary 57 (L) 65 - 99 mg/dL  Glucose, capillary     Status: None   Collection Time: 10/01/2016 11:05 PM  Result Value Ref Range   Glucose-Capillary 65 65 - 99 mg/dL  Glucose,  capillary     Status: Abnormal   Collection Time: 09/25/16  1:03 AM  Result Value Ref Range   Glucose-Capillary 52 (L) 65 - 99 mg/dL  Basic metabolic panel     Status: Abnormal   Collection Time: 09/25/16  3:38 AM  Result Value Ref Range   Sodium 131 (L) 135 - 145 mmol/L   Potassium 3.5 3.5 - 5.1 mmol/L   Chloride 97 (L) 101 - 111 mmol/L   CO2 24 22 - 32 mmol/L   Glucose, Bld 61 (L) 65 - 99 mg/dL   BUN 22 (H) 6 - 20 mg/dL   Creatinine, Ser 3.28 (H) 0.61 - 1.24 mg/dL   Calcium 8.0 (L) 8.9 - 10.3 mg/dL   GFR calc non Af Amer 15 (L) >60 mL/min   GFR calc Af Amer 17 (L) >60 mL/min    Comment: (NOTE) The eGFR has been calculated using the CKD EPI equation. This calculation has not been validated in all clinical situations. eGFR's persistently <60 mL/min signify possible Chronic Kidney Disease.    Anion gap 10 5 - 15  CBC     Status: Abnormal   Collection Time: 09/25/16  3:38 AM  Result Value Ref Range   WBC 9.5 3.8 - 10.6 K/uL   RBC 2.70 (L) 4.40 - 5.90 MIL/uL   Hemoglobin 9.5 (L) 13.0 - 18.0 g/dL   HCT 28.9 (L) 61.0 - 35.1 %   MCV 101.3 (H) 80.0 - 100.0 fL   MCH 35.2 (H) 26.0 - 34.0 pg   MCHC 34.7 32.0 - 36.0 g/dL   RDW 17.5 (H) 30.7 - 41.3 %   Platelets 151 150 - 440 K/uL  Bilirubin,  total     Status: Abnormal   Collection Time: 09/25/16  3:38 AM  Result Value Ref Range   Total Bilirubin 2.5 (H) 0.3 - 1.2 mg/dL  Glucose, capillary     Status: None   Collection Time: 09/25/16  4:31 AM  Result Value Ref Range   Glucose-Capillary 78 65 - 99 mg/dL  Glucose, capillary     Status: Abnormal   Collection Time: 09/25/16  6:23 AM  Result Value Ref Range   Glucose-Capillary 103 (H) 65 - 99 mg/dL  Glucose, capillary     Status: None   Collection Time: 09/25/16  7:59 AM  Result Value Ref Range   Glucose-Capillary 86 65 - 99 mg/dL  Glucose, capillary     Status: Abnormal   Collection Time: 09/25/16  9:45 AM  Result Value Ref Range   Glucose-Capillary 100 (H) 65 - 99 mg/dL   Comment 1 Notify RN   Glucose, capillary     Status: Abnormal   Collection Time: 09/25/16 11:51 AM  Result Value Ref Range   Glucose-Capillary 100 (H) 65 - 99 mg/dL   Comment 1 Notify RN     Dg Chest Port 1 View  Result Date: 09/25/2016 CLINICAL DATA:  Cough. History of diabetes and congestive heart failure. Former smoker. EXAM: PORTABLE CHEST 1 VIEW COMPARISON:  None. FINDINGS: Postoperative changes in the mediastinum. Cardiac enlargement with mild vascular congestion. No edema or consolidation. Small left pleural effusion with atelectasis or infiltration in the left lung base. Can't exclude pneumonia. Calcified and tortuous aorta. IMPRESSION: Cardiac enlargement with mild vascular congestion. No edema. Small left pleural effusion with left basilar infiltration or atelectasis. Electronically Signed   By: Burman Nieves M.D.   On: 09/25/2016 01:55   Dg Foot Complete Right  Result Date: 10/03/2016 CLINICAL DATA:  Diabetes, transmetatarsal amputation, soft tissue ulceration EXAM: RIGHT FOOT COMPLETE - 3+ VIEW COMPARISON:  01/02/2009 FINDINGS: Large soft tissue ulceration of the transmetatarsal soft tissue site medially. No radiopaque foreign body. Transmetatarsal amputation noted. No definite acute bony  destruction, periostitis, or fracture. Peripheral atherosclerosis noted. Bones are osteopenic. IMPRESSION: Large soft tissue ulceration of the transmetatarsal amputation soft tissues. No underlying acute or abnormal osseous finding by plain radiography. Peripheral atherosclerosis Electronically Signed   By: Jerilynn Mages.  Shick M.D.   On: 10/03/2016 14:04   US Abdomen Limited Ruq  Result Date: 09/25/2016 CLINICAL DATA:  Elevated bilirubin, vomiting EXAM: ULTRASOUND ABDOMEN LIMITED RIGHT UPPER QUADRANT COMPARISON:  None. FINDINGS: Gallbladder: Gallbladder is distended. Sludge noted within the gallbladder. No gallbladder wall thickening or sonographic Murphy sign. Common bile duct: Diameter: Normal caliber, 3 mm Liver: Nodular contours of the liver compatible with cirrhosis. No focal hepatic abnormality. No intrahepatic biliary duct dilatation. Portal vein is patent on color Doppler imaging with normal direction of blood flow towards the liver. IMPRESSION: Mild gallbladder distention with sludge noted. No visible stones, wall thickening or sonographic Murphy's sign. Changes of cirrhosis.  No focal hepatic abnormality. Electronically Signed   By: Rolm Baptise M.D.   On: 09/25/2016 10:00    Review of Systems  Unable to perform ROS: Language   Blood pressure (!) 82/48, pulse 68, temperature 99.1 F (37.3 C), temperature source Oral, resp. rate 20, height '5\' 6"'$  (1.676 m), weight 80 kg (176 lb 5.9 oz), SpO2 97 %. Physical Exam  Cardiovascular:  DP and PT pulses are thready and difficult to palpate bilateral.  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 Guarded range of motion with significant pain on any attempt at palpation or motion on the left.  Neurological:  Pain sensation intact. Otherwise difficult to assess neurological status  Skin:  The skin is then dry and atrophic with absent hair growth. Some bilateral hyperpigmentation's. Full-thickness ulceration approximately 2.5 cm  diameter on the plantar aspect of the right midfoot at the transmetatarsal amputation site. No clear deeper extension towards the bone or deeper tissues.    Assessment/Plan: Assessment: 1. Full-thickness ulceration right foot. 2. Severe peripheral vascular disease. 3. Diabetes  Plan: The wound beneath the right foot is clean with no significant sign of infection. A sterile saline wet-to-dry gauze was applied to the right foot. Apparently from previous vascular from [facility] revealed that he has non-reconstructable vascular disease. Would agree with general surgery that he is at high risk for above-the-knee amputations. At this point I will defer to vascular surgery and the wound care center for treatment as the right foot appears relatively stable.  Durward Fortes 09/25/2016, 12:28 PM

## 2016-09-25 NOTE — Consult Note (Signed)
Name: Travis [name] MRN: [id number] DOB: 1950-09-27    ADMISSION DATE:  09/15/2016 CONSULTATION DATE: 09/25/2016  REFERRING MD : Dr. Juliene Pina   CHIEF COMPLAINT: Sepsis  BRIEF PATIENT DESCRIPTION:  66 yo male admitted 09/19 with severe sepsis with hypotension likely secondary to bilateral lower extremity wounds requiring vasopressor therapy   SIGNIFICANT EVENTS  09/20-Pt transferred to ICU   STUDIES:  US Abdomen Limited RUQ 09/20>>Mild gallbladder distention with sludge noted. No visible stones, wall thickening or sonographic Murphy's sign. Changes of cirrhosis.  No focal hepatic abnormality  HISTORY OF PRESENT ILLNESS:  This is a 66 yo male with a PMH of Osteomyelitis, ESRD on Hemodialysis, CAD, Atrial Fibrillation, and CHF.  He presented to [facility] ER 09/19 with c/o worsening pain and drainage from bilateral lower extremity wounds.  He has a history of osteomyelitis to the bilateral lower extremities requiring multiple medications and right forefoot and left 5th toe amputations.  He has been unable to walk since June 2018 due to pain.  He was sent to the ER following outpatient hemodialysis session due to symptoms.  In the ER he was hypoglycemic requiring 1 amp D50 and D5W infusion started.  He was subsequently started on IV antibiotics and admitted to the [facility] unit for further workup and treatment. General Surgery consulted and concluded the pt may need repeat LLE digital pressure +/- TCPO2 and he is high risk for possible AKA(s).  He was transferred to ICU 09/20 due to hypotension requiring vasopressor therapy and PCCM consulted for management.   PAST MEDICAL HISTORY :   has a past medical history of CHF (congestive heart failure) (HCC); Chronic kidney disease; Coronary artery disease; Diabetes mellitus without complication (HCC); Dialysis patient [facility]); and Osteomyelitis (HCC).  has a past surgical history that includes Foot surgery (Right); Arterial thrombectomy; Cardiac surgery; Vascular  surgery; A/V Fistulagram (Left, 03/03/2016); and A/V SHUNT INTERVENTION (N/A, 03/03/2016). Prior to Admission medications   Medication Sig Start Date End Date Taking? Authorizing Provider  aspirin EC 81 MG tablet Take 81 mg by mouth daily.   Yes [provider]  clopidogrel (PLAVIX) 75 MG tablet Take 1 tablet by mouth daily. 02/08/15  Yes [provider]  insulin detemir (LEVEMIR) 100 UNIT/ML injection Inject 10 Units into the skin daily.   Yes [provider]  isosorbide dinitrate (ISORDIL) 30 MG tablet Take 1 tablet by mouth 3 (three) times daily. 02/08/15  Yes [provider]  metoprolol succinate (TOPROL-XL) 50 MG 24 hr tablet Take 50 mg by mouth daily. Take with or immediately following a meal.   Yes [provider]  Vitamin D, Ergocalciferol, (DRISDOL) 50000 units CAPS capsule Take 50,000 Units by mouth every 7 (seven) days.   Yes [provider]  carvedilol (COREG) 3.125 MG tablet Take 1 tablet (3.125 mg total) by mouth 2 (two) times daily with a meal. Patient not taking: Reported on 09/29/2016 02/22/15   Ramonita Lab, MD  sevelamer carbonate (RENVELA) 800 MG tablet Take 3 tablets (2,400 mg total) by mouth 3 (three) times daily with meals. Patient not taking: Reported on 09/06/2016 02/22/15   Ramonita Lab, MD   No Known Allergies  FAMILY HISTORY:  family history is not on file. SOCIAL HISTORY:  reports that he has quit smoking. He has never used smokeless tobacco. He reports that he does not drink alcohol or use drugs.  REVIEW OF SYSTEMS: Positives in BOLD Constitutional: Negative for fever, chills, weight loss, malaise/fatigue and diaphoresis.  HENT: Negative for  hearing loss, ear pain, nosebleeds, congestion, sore throat, neck pain, tinnitus and ear discharge.   Eyes: Negative for blurred vision, double vision, photophobia, pain, discharge and redness.  Respiratory: Negative for cough, hemoptysis, sputum production, shortness of breath,  wheezing and stridor.   Cardiovascular: Negative for chest pain, palpitations, orthopnea, claudication, leg swelling and PND.  Gastrointestinal: Negative for heartburn, nausea, vomiting, abdominal pain, diarrhea, constipation, blood in stool and melena.  Genitourinary: Negative for dysuria, urgency, frequency, hematuria and flank pain.  Musculoskeletal: bilateral lower extremity pain, myalgias, back pain, joint pain and falls.  Skin: Negative for itching and rash.  Neurological: Negative for dizziness, tingling, tremors, sensory change, speech change, focal weakness, seizures, loss of consciousness, weakness and headaches.  Endo/Heme/Allergies: Negative for environmental allergies and polydipsia. Does not bruise/bleed easily.  SUBJECTIVE:  Pt lethargic states his pain has improved   VITAL SIGNS: Temp:  [99.1 F (37.3 C)-103.1 F (39.5 C)] 99.1 F (37.3 C) (09/20 0435) Pulse Rate:  [63-111] 68 (09/20 0802) Resp:  [17-26] 20 (09/20 0435) BP: (71-121)/(41-81) 82/48 (09/20 0802) SpO2:  [93 %-100 %] 97 % (09/20 0802)  PHYSICAL EXAMINATION: General: well developed, well nourished male, NAD  Neuro: alert and oriented, follows commands, PERRLA  HEENT: supple, no JVD Cardiovascular: irregular, irregular, no M/R/G Lungs: clear throughout, even, non labored Abdomen: +BS x4, soft, non tender, non distended Musculoskeletal: moves all extremities, left 5th toe amputation, transmetatarsal amputation right foot  Skin: bilateral lower extremity dressings dry and intact, stage I pressure ulcer sacral spine    Recent Labs Lab 09/06/2016 1221 09/25/16 0338  NA 133* 131*  K 2.6* 3.5  CL 98* 97*  CO2 27 24  BUN 15 22*  CREATININE 3.00* 3.87*  GLUCOSE 37* 61*    Recent Labs Lab 09/17/2016 1221 09/25/16 0338  HGB 10.0* 9.5*  HCT 28.9* 27.3*  WBC 6.6 9.5  PLT 189 151   Dg Chest Port 1 View  Result Date: 09/25/2016 CLINICAL DATA:  Cough. History of diabetes and congestive heart failure.  Former smoker. EXAM: PORTABLE CHEST 1 VIEW COMPARISON:  None. FINDINGS: Postoperative changes in the mediastinum. Cardiac enlargement with mild vascular congestion. No edema or consolidation. Small left pleural effusion with atelectasis or infiltration in the left lung base. Can't exclude pneumonia. Calcified and tortuous aorta. IMPRESSION: Cardiac enlargement with mild vascular congestion. No edema. Small left pleural effusion with left basilar infiltration or atelectasis. Electronically Signed   By: Burman Nieves M.D.   On: 09/25/2016 01:55   Dg Foot Complete Right  Result Date: 09/15/2016 CLINICAL DATA:  Diabetes, transmetatarsal amputation, soft tissue ulceration EXAM: RIGHT FOOT COMPLETE - 3+ VIEW COMPARISON:  01/02/2009 FINDINGS: Large soft tissue ulceration of the transmetatarsal soft tissue site medially. No radiopaque foreign body. Transmetatarsal amputation noted. No definite acute bony destruction, periostitis, or fracture. Peripheral atherosclerosis noted. Bones are osteopenic. IMPRESSION: Large soft tissue ulceration of the transmetatarsal amputation soft tissues. No underlying acute or abnormal osseous finding by plain radiography. Peripheral atherosclerosis Electronically Signed   By: Judie Petit.  Shick M.D.   On: 09/07/2016 14:04   US Abdomen Limited Ruq  Result Date: 09/25/2016 CLINICAL DATA:  Elevated bilirubin, vomiting EXAM: ULTRASOUND ABDOMEN LIMITED RIGHT UPPER QUADRANT COMPARISON:  None. FINDINGS: Gallbladder: Gallbladder is distended. Sludge noted within the gallbladder. No gallbladder wall thickening or sonographic Murphy sign. Common bile duct: Diameter: Normal caliber, 3 mm Liver: Nodular contours of the liver compatible with cirrhosis. No focal hepatic abnormality. No intrahepatic biliary duct dilatation. Portal vein is patent  on color Doppler imaging with normal direction of blood flow towards the liver. IMPRESSION: Mild gallbladder distention with sludge noted. No visible stones, wall  thickening or sonographic Murphy's sign. Changes of cirrhosis.  No focal hepatic abnormality. Electronically Signed   By: Charlett Nose M.D.   On: 09/25/2016 10:00    ASSESSMENT / PLAN: Severe Septic Shock with hypotension likely secondary to bilateral lower extremity wounds Hyponatremia Hypoglycemia Elevated bilirubin Anemia without acute blood loss ESRD on hemodialysis  Mildly elevated troponin likely secondary to demand ischemia Atrial Fibrillation-rate controlled  P: Prn peripheral neo-synephrine gtt to maintain map >60  Will hold off on central line placement for now if vasopressor requirement increases will place central line  Trend WBC and monitor fever curve Trend PCT and lactic acid Follow cultures Continue abx ID, Vascular, Nephrology, General Surgery, and Podiatry consulted appreciate input  Trend troponin Continuous telemetry monitoring  Continue D10W  ml/hr will d/c SSI  CBG's q2hrs Prn norco for pain management  Subq heparin for VTE prophylaxis Monitor for s/sx of bleeding   Sonda Rumble, AGNP  Pulmonary/Critical Care Pager [phone] (please enter 7 digits) PCCM Consult Pager [phone] (please enter 7 digits)  PCCM ATTENDING ATTESTATION:  I have evaluated patient with the APP Blakeney, reviewed database in its entirety and discussed care plan in detail.   He has end-stage renal disease and severe peripheral vascular disease. 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 I have discussed this in detail with ANP Blakeney and modified it accordingly. MAP goal 60 mmHg    Billy Fischer, MD PCCM service Mobile [phone] Pager [phone] 09/25/2016 3:23 PM

## 2016-09-25 NOTE — Progress Notes (Signed)
[facility]  ROUNDING NOTE   Subjective:  Patient well known to [place]. He presents now with bilateral lower extremity ulcerations. These have been ongoing for several months. He was apparently to see a wound specialist for these but had not yet had a chance to see the specialist. Patient did complete hemodialysis yesterday. Patient has been febrile today and is also hypotensive.  blood cultures have been drawn and are thus far negative. Patient currently on multiple antibiotics.  Objective:  Vital signs in last 24 hours:  Temp:  [99.1 F (37.3 C)-103.1 F (39.5 C)] 99.1 F (37.3 C) (09/20 0435) Pulse Rate:  [63-111] 68 (09/20 0802) Resp:  [17-26] 20 (09/20 0435) BP: (71-121)/(41-81) 82/48 (09/20 0802) SpO2:  [93 %-100 %] 97 % (09/20 0802)  Weight change:  Filed Weights   09/08/2016 1122  Weight: 80 kg (176 lb 5.9 oz)    Intake/Output: I/O last 3 completed shifts: In: 1390 [I.V.:1340; IV Piggyback:50] Out: 0    Intake/Output this shift:  No intake/output data recorded.  Physical Exam: General: No acute distress  Head: Normocephalic, atraumatic. Moist oral mucosal membranes  Eyes: Anicteric  Neck: Supple, trachea midline  Lungs:  Clear to auscultation, normal effort  Heart: S1S2 no rubs  Abdomen:  Soft, nontender, bowel sounds present  Extremities: Bilateral lower extremeties wrapped, 1+ b/l LE edema.  Neurologic: Awake, alert, following commands  Skin: No lesions  Access: LUE AV access    Basic Metabolic Panel:  Recent Labs Lab 10/05/2016 1221 09/25/16 0338  NA 133* 131*  K 2.6* 3.5  CL 98* 97*  CO2 27 24  GLUCOSE 37* 61*  BUN 15 22*  CREATININE 3.00* 3.87*  CALCIUM 8.5* 8.0*  MG 1.8  --     Liver Function Tests:  Recent Labs Lab 09/26/2016 1221 09/25/16 0338  AST 45*  --   ALT 20  --   ALKPHOS 124  --   BILITOT 1.9* 2.5*  PROT 7.2  --   ALBUMIN 2.3*  --    No results for input(s): LIPASE, AMYLASE in the last 168 hours. No results  for input(s): AMMONIA in the last 168 hours.  CBC:  Recent Labs Lab 09/12/2016 1221 09/25/16 0338  WBC 6.6 9.5  NEUTROABS 4.7  --   HGB 10.0* 9.5*  HCT 28.9* 27.3*  MCV 100.2* 101.3*  PLT 189 151    Cardiac Enzymes:  Recent Labs Lab 09/11/2016 1221  TROPONINI 0.11*    BNP: Invalid input(s): POCBNP  CBG:  Recent Labs Lab 09/25/16 0103 09/25/16 0431 09/25/16 0623 09/25/16 0759 09/25/16 0945  GLUCAP 52* 78 103* 86 100*    Microbiology: Results for orders placed or performed during the hospital encounter of 09/23/2016  Blood Culture (routine x 2)     Status: None (Preliminary result)   Collection Time: 09/26/2016 12:21 PM  Result Value Ref Range Status   Specimen Description BLOOD RIGHT AC  Final   Special Requests   Final    BOTTLES DRAWN AEROBIC AND ANAEROBIC Blood Culture adequate volume   Culture NO GROWTH < 24 HOURS  Final   Report Status PENDING  Incomplete  Blood Culture (routine x 2)     Status: None (Preliminary result)   Collection Time: 09/27/2016 12:21 PM  Result Value Ref Range Status   Specimen Description BLOOD BLOOD RIGHT HAND  Final   Special Requests   Final    BOTTLES DRAWN AEROBIC AND ANAEROBIC Blood Culture results may not be optimal due to  an inadequate volume of blood received in culture bottles   Culture NO GROWTH < 24 HOURS  Final   Report Status PENDING  Incomplete  Surgical PCR screen     Status: Abnormal   Collection Time: 09/16/2016  6:31 PM  Result Value Ref Range Status   MRSA, PCR POSITIVE (A) NEGATIVE Final    Comment: RESULT CALLED TO, READ BACK BY AND VERIFIED WITH: CRYSTAL BASS AT 2041 09/23/2016 BY TFK    Staphylococcus aureus POSITIVE (A) NEGATIVE Final    Comment: (NOTE) The Xpert SA Assay (FDA approved for NASAL specimens in patients 39 years of age and older), is one component of a comprehensive surveillance program. It is not intended to diagnose infection nor to guide or monitor treatment.     Coagulation Studies: No  results for input(s): LABPROT, INR in the last 72 hours.  Urinalysis: No results for input(s): COLORURINE, LABSPEC, PHURINE, GLUCOSEU, HGBUR, BILIRUBINUR, KETONESUR, PROTEINUR, UROBILINOGEN, NITRITE, LEUKOCYTESUR in the last 72 hours.  Invalid input(s): APPERANCEUR    Imaging: Dg Chest Port 1 View  Result Date: 09/25/2016 CLINICAL DATA:  Cough. History of diabetes and congestive heart failure. Former smoker. EXAM: PORTABLE CHEST 1 VIEW COMPARISON:  None. FINDINGS: Postoperative changes in the mediastinum. Cardiac enlargement with mild vascular congestion. No edema or consolidation. Small left pleural effusion with atelectasis or infiltration in the left lung base. Can't exclude pneumonia. Calcified and tortuous aorta. IMPRESSION: Cardiac enlargement with mild vascular congestion. No edema. Small left pleural effusion with left basilar infiltration or atelectasis. Electronically Signed   By: Burman Nieves M.D.   On: 09/25/2016 01:55   Dg Foot Complete Right  Result Date: 09/15/2016 CLINICAL DATA:  Diabetes, transmetatarsal amputation, soft tissue ulceration EXAM: RIGHT FOOT COMPLETE - 3+ VIEW COMPARISON:  01/02/2009 FINDINGS: Large soft tissue ulceration of the transmetatarsal soft tissue site medially. No radiopaque foreign body. Transmetatarsal amputation noted. No definite acute bony destruction, periostitis, or fracture. Peripheral atherosclerosis noted. Bones are osteopenic. IMPRESSION: Large soft tissue ulceration of the transmetatarsal amputation soft tissues. No underlying acute or abnormal osseous finding by plain radiography. Peripheral atherosclerosis Electronically Signed   By: Judie Petit.  Shick M.D.   On: 09/21/2016 14:04   US Abdomen Limited Ruq  Result Date: 09/25/2016 CLINICAL DATA:  Elevated bilirubin, vomiting EXAM: ULTRASOUND ABDOMEN LIMITED RIGHT UPPER QUADRANT COMPARISON:  None. FINDINGS: Gallbladder: Gallbladder is distended. Sludge noted within the gallbladder. No gallbladder wall  thickening or sonographic Murphy sign. Common bile duct: Diameter: Normal caliber, 3 mm Liver: Nodular contours of the liver compatible with cirrhosis. No focal hepatic abnormality. No intrahepatic biliary duct dilatation. Portal vein is patent on color Doppler imaging with normal direction of blood flow towards the liver. IMPRESSION: Mild gallbladder distention with sludge noted. No visible stones, wall thickening or sonographic Murphy's sign. Changes of cirrhosis.  No focal hepatic abnormality. Electronically Signed   By: Charlett Nose M.D.   On: 09/25/2016 10:00     Medications:   . sodium chloride    . dextrose    . piperacillin-tazobactam (ZOSYN)  IV 3.375 g (09/25/16 1036)   . aspirin EC  81 mg Oral Daily  . dextrose  50 mL Intravenous STAT  . heparin  5,000 Units Subcutaneous Q8H  . insulin aspart  0-9 Units Subcutaneous TID WC  . sodium chloride flush  3 mL Intravenous Q12H  . [START ON 09/30/2016] Vitamin D (Ergocalciferol)  50,000 Units Oral Q7 days   sodium chloride, acetaminophen **OR** acetaminophen, albuterol, HYDROcodone-acetaminophen, ondansetron **OR**  ondansetron (ZOFRAN) IV, promethazine, senna-docusate, sodium chloride flush  Assessment/ Plan:  66 y.o. male with diabetes, hypertension, coronary artery disease s/p CABG 2011, anemia of CKD, SHPTH, who presents now with b/l LE ulcerations  MWF [facility] [address]   1. End Stage Renal Disease:  Patient did undergo complete hemodialysis treatment yesterday. No urgent indication for dialysis at the moment and tentatively we will plan for dialysis again tomorrow unless he needs and earlier than that.  2. Hypotension. Patient could possibly be septic. Blood pressure currently 82/48. If blood pressure remains low consider transfer to intensive care unit and initiate pressors. Okay to administer a 500 cc normal saline bolus as needed but defer this to hospitalist.  3. Anemia of chronic kidney disease: hemoglobin 9.5,  consider restarting Epogen tomorrow.  4. Secondary Hyperparathyroidism:  Check intact PTH and phosphorus with next dialysis treatment.   LOS: 1 Kristofor Michalowski 9/20/201811:39 AM

## 2016-09-25 NOTE — Progress Notes (Signed)
Travis [name] notified of patients Afib 70-80's. At this point no additional orders.

## 2016-09-25 NOTE — Progress Notes (Signed)
Travis [name] notified of patients heart rate dropping down to 40's. Patient was vomiting at the time. She was also made aware of patients troponin level of 1.43. At this time no new orders.

## 2016-09-25 NOTE — Consult Note (Signed)
WOC Nurse wound consult note Reason for Consult: bilateral legs with venous and arterial insuffciency full thickness and partial thickness, left plantar neuropatahic ulcer, sacral stage II POA, left posterior calf stage III  Wound type: full thickness from PAD, DM neuropathic, and Stage II on sacrum, stage III posterior left leg Pressure Injury POA: yes Measurement:entire right posterial and medial and lateral calf with a 6cm x 6cm area of boggy white gelatenous slough in center. 30% white slough, 30% black soft eschar, 40% pink non granulating tissue. Copious malodorous serosanguinous drainage.   Right pretibial 2.5cm round eschar area above skin level, dry, no drainage or odor, with epibole on all wound edges. Right plantar surface of foot with 2cm round x 0.6cm deep neuropathic wound, moist white loose eschar at wound bed, no drainage or odor.  Left pretibial 15cm x 6cm full thickness wound 90% black hard shell eschar, 10% pink non granulating bed, no drainage, no odor. Left posterior calf 10cm x 7cm x 0.2 cm full thickness with partial thickness around perimeter, 20% yellow slough, 80% slimy pale pink bed with non granulating wound bed, with epibole on 70% of wound edge, small amount serous drainage. This appears more consistent with a pressure ulcer, however, pt very mobile in bed moving extremities well. If pressure related it is a stage III.  Wound bed:see above Drainage (amount, consistency, odor) see above Periwound:see above Dressing procedure/placement/frequency: Awaiting Vascular consult, therefore will order conservative treatment until they have seen pt and made a plan. To posterior right lower leg and anterior and posterior left lower leg wounds, cleanse wounds with NS, pat dry, apply large Xeroform, cover  wounds with ABDs, wrap with kerlix, adhere kerlix to kerlix, no tape on skin, perform daily and prn soiling. Prevalon boots to prevent foot ulcers. Foam dressing to sacral ulcer, change  Q3D and prn soiling. Peel back and assess Q shift. I will order a mattress replaement with low air loss feature. Patient's lab work shows malnutrition, would recommend nutritional consult, please order if you agree. We will not follow, but will remain available to this patient, to nursing, and the medical and/or surgical teams.We will watch for Vascular Surgery plan and adjust orders if needed.  Please re-consult if we need to assist further.   Barnett Hatter, RN-C, WTA-C Wound Treatment Associate

## 2016-09-25 NOTE — Care Management (Signed)
Amanda Morris HD liaison notified of admission.  

## 2016-09-26 ENCOUNTER — Inpatient Hospital Stay: Payer: Medicare Other

## 2016-09-26 DIAGNOSIS — F329 Major depressive disorder, single episode, unspecified: Secondary | ICD-10-CM

## 2016-09-26 DIAGNOSIS — F32A Depression, unspecified: Secondary | ICD-10-CM

## 2016-09-26 DIAGNOSIS — R410 Disorientation, unspecified: Secondary | ICD-10-CM

## 2016-09-26 DIAGNOSIS — R630 Anorexia: Secondary | ICD-10-CM

## 2016-09-26 DIAGNOSIS — F05 Delirium due to known physiological condition: Secondary | ICD-10-CM

## 2016-09-26 DIAGNOSIS — L8990 Pressure ulcer of unspecified site, unspecified stage: Secondary | ICD-10-CM | POA: Insufficient documentation

## 2016-09-26 LAB — GLUCOSE, CAPILLARY
GLUCOSE-CAPILLARY: 116 mg/dL — AB (ref 65–99)
GLUCOSE-CAPILLARY: 94 mg/dL (ref 65–99)
GLUCOSE-CAPILLARY: 117 mg/dL — AB (ref 65–99)
Glucose-Capillary: 109 mg/dL — ABNORMAL HIGH (ref 65–99)
Glucose-Capillary: 116 mg/dL — ABNORMAL HIGH (ref 65–99)
Glucose-Capillary: 121 mg/dL — ABNORMAL HIGH (ref 65–99)
Glucose-Capillary: 123 mg/dL — ABNORMAL HIGH (ref 65–99)
Glucose-Capillary: 125 mg/dL — ABNORMAL HIGH (ref 65–99)
Glucose-Capillary: 95 mg/dL (ref 65–99)

## 2016-09-26 LAB — CBC
HCT: 28.6 % — ABNORMAL LOW (ref 40.0–52.0)
Hemoglobin: 9.5 g/dL — ABNORMAL LOW (ref 13.0–18.0)
MCH: 33.4 pg (ref 26.0–34.0)
MCHC: 33.3 g/dL (ref 32.0–36.0)
MCV: 100.3 fL — ABNORMAL HIGH (ref 80.0–100.0)
PLATELETS: 168 10*3/uL (ref 150–440)
RBC: 2.85 MIL/uL — AB (ref 4.40–5.90)
RDW: 16.5 % — ABNORMAL HIGH (ref 11.5–14.5)
WBC: 12.6 10*3/uL — AB (ref 3.8–10.6)

## 2016-09-26 LAB — PROCALCITONIN: PROCALCITONIN: 28.05 ng/mL

## 2016-09-26 LAB — BASIC METABOLIC PANEL
Anion gap: 12 (ref 5–15)
BUN: 31 mg/dL — AB (ref 6–20)
CO2: 23 mmol/L (ref 22–32)
CREATININE: 4.67 mg/dL — AB (ref 0.61–1.24)
Calcium: 8.5 mg/dL — ABNORMAL LOW (ref 8.9–10.3)
Chloride: 94 mmol/L — ABNORMAL LOW (ref 101–111)
GFR calc Af Amer: 14 mL/min — ABNORMAL LOW (ref >60)
GFR, EST NON AFRICAN AMERICAN: 12 mL/min — AB (ref >60)
Glucose, Bld: 133 mg/dL — ABNORMAL HIGH (ref 65–99)
POTASSIUM: 3.5 mmol/L (ref 3.5–5.1)
SODIUM: 129 mmol/L — AB (ref 135–145)

## 2016-09-26 LAB — TROPONIN I
Troponin I: 1.38 ng/mL (ref <0.03)
Troponin I: 1.4 ng/mL (ref <0.03)

## 2016-09-26 MED ORDER — MIRTAZAPINE 15 MG PO TBDP
7.5000 mg | ORAL_TABLET | Freq: Every day | ORAL | Status: DC
  Administered 2016-09-27 – 2016-09-29 (×3): 7.5 mg via ORAL
  Filled 2016-09-26 (×5): qty 0.5

## 2016-09-26 MED ORDER — VANCOMYCIN HCL IN DEXTROSE 750-5 MG/150ML-% IV SOLN
750.0000 mg | Freq: Once | INTRAVENOUS | Status: AC
  Administered 2016-09-26: 750 mg via INTRAVENOUS
  Filled 2016-09-26: qty 150

## 2016-09-26 MED ORDER — VANCOMYCIN HCL IN DEXTROSE 750-5 MG/150ML-% IV SOLN
750.0000 mg | INTRAVENOUS | Status: DC
  Administered 2016-10-01: 750 mg via INTRAVENOUS
  Filled 2016-09-26 (×3): qty 150

## 2016-09-26 MED ORDER — EPOETIN ALFA 10000 UNIT/ML IJ SOLN
10000.0000 [IU] | INTRAMUSCULAR | Status: DC
  Administered 2016-09-26: 10000 [IU] via INTRAVENOUS
  Filled 2016-09-26: qty 1

## 2016-09-26 NOTE — Progress Notes (Signed)
Pharmacy Antibiotic Note  Travis [name] is a 66 y.o. male admitted on 09/11/2016 with cellulitis.  Pharmacy has been consulted for vancomycin and piperacillin/tazobactam dosing. Patient received loading dose of 1750 mg on 9/19. Pt has ESRD and is on HD, receives HD MWF.    Plan: Continue  with HD session.  Goal VT 15-25 mcg/mL Continue piperacillin/tazobactam 3.375 g IV q12h EI.  Height:  (167.6 cm) Weight: 179 lb 0.2 oz (81.2 kg) IBW/kg (Calculated) : 63.8  Temp (24hrs), Avg:97.8 F (36.6 C), Min:96.1 F (35.6 C), Max:98.6 F (37 C)   Recent Labs Lab 09/17/2016 1221 09/25/16 0338 09/25/16 1235 09/25/16 1438 09/26/16 0208  WBC 6.6 9.5  --   --  12.6*  CREATININE 3.00* 3.87*  --   --  4.67*  LATICACIDVEN 1.3  --  1.6 1.7  --     Estimated Creatinine Clearance: 15.4 mL/min (A) (by C-G formula based on SCr of 4.67 mg/dL (H)).    No Known Allergies  Antimicrobials this admission: Vancomycin 9/19 >>  Piperacillin/tazobactam 9/19 >>  Cefepime dose in ED 9/19  Dose adjustments this admission: N/A   Microbiology results: 9/19 BCx: no growth in 2 days  9/20 Aerobic cx: rare gram positive cocci, rare gram negative rods  9/19 MRSA: positive    Thank you for allowing pharmacy to be a part of this patient's care.  Dwain Sarna, PharmD Student 09/26/2016 11:11 AM

## 2016-09-26 NOTE — Progress Notes (Signed)
[facility]  ROUNDING NOTE   Subjective:  Patient seen at bedside this a.m. Interpreter present at bedside. Patient complains of significant nausea and vomiting. He is unable to keep anything on his stomach. He was transitioned to the intensive care unit given hypotension. When we initially saw the patient he was off of pressors. After discussion with pulmonary/critical care we have decided to start the patient on pressors so that he may receive his hemodialysis.  Objective:  Vital signs in last 24 hours:  Temp:  [96.1 F (35.6 C)-98.1 F (36.7 C)] 97.8 F (36.6 C) (09/21 0500) Pulse Rate:  [65-94] 89 (09/21 0600) Resp:  [14-27] 20 (09/21 0600) BP: (67-102)/(46-71) 76/55 (09/21 0600) SpO2:  [75 %-99 %] 97 % (09/21 0600) Weight:  [81.2 kg (179 lb 0.2 oz)-82.6 kg (182 lb 1.6 oz)] 81.2 kg (179 lb 0.2 oz) (09/21 0458)  Weight change: 2.6 kg (5 lb 11.7 oz) Filed Weights   10/24/16 1122 09/25/16 1410 09/26/16 0458  Weight: 80 kg (176 lb 5.9 oz) 82.6 kg (182 lb 1.6 oz) 81.2 kg (179 lb 0.2 oz)    Intake/Output: I/O last 3 completed shifts: In: 1870 [I.V.:1770; IV Piggyback:100] Out: 0    Intake/Output this shift:  No intake/output data recorded.  Physical Exam: General: No acute distress  Head: Normocephalic, atraumatic. Moist oral mucosal membranes  Eyes: Anicteric  Neck: Supple, trachea midline  Lungs:  Clear to auscultation, normal effort  Heart: S1S2 no rubs  Abdomen:  Soft, nontender, bowel sounds present  Extremities: Bilateral lower extremeties wrapped, 1+ b/l LE edema.  Neurologic: Awake, alert, following commands  Skin: No rashes  Access: LUE AV access    Basic Metabolic Panel:  Recent Labs Lab 2016-10-24 1221 09/25/16 0338 09/26/16 0208  NA 133* 131* 129*  K 2.6* 3.5 3.5  CL 98* 97* 94*  CO2 GLUCOSE 37* 61* 133*  BUN 15 22* 31*  CREATININE 3.00* 3.87* 4.67*  CALCIUM 8.5* 8.0* 8.5*  MG 1.8  --   --     Liver Function  Tests:  Recent Labs Lab October 24, 2016 1221 09/25/16 0338  AST 45*  --   ALT 20  --   ALKPHOS 124  --   BILITOT 1.9* 2.5*  PROT 7.2  --   ALBUMIN 2.3*  --    No results for input(s): LIPASE, AMYLASE in the last 168 hours. No results for input(s): AMMONIA in the last 168 hours.  CBC:  Recent Labs Lab Oct 24, 2016 1221 09/25/16 0338 09/26/16 0208  WBC 6.6 9.5 12.6*  NEUTROABS 4.7  --   --   HGB 10.0* 9.5* 9.5*  HCT 28.9* 27.3* 28.6*  MCV 100.2* 101.3* 100.3*  PLT 189 151 168    Cardiac Enzymes:  Recent Labs Lab 2016/10/24 1221 09/25/16 1438 09/25/16 2138 09/26/16 0208  TROPONINI 0.11* 1.43* 1.27* 1.38*    BNP: Invalid input(s): POCBNP  CBG:  Recent Labs Lab 09/25/16 2357 09/26/16 0221 09/26/16 0448 09/26/16 0644 09/26/16 0752  GLUCAP 123* 125* 116* 116* 121*    Microbiology: Results for orders placed or performed during the hospital encounter of Oct 24, 2016  Blood Culture (routine x 2)     Status: None (Preliminary result)   Collection Time: 2016/10/24 12:21 PM  Result Value Ref Range Status   Specimen Description BLOOD RIGHT AC  Final   Special Requests   Final    BOTTLES DRAWN AEROBIC AND ANAEROBIC Blood Culture adequate volume   Culture NO GROWTH 2 DAYS  Final   Report Status PENDING  Incomplete  Blood Culture (routine x 2)     Status: None (Preliminary result)   Collection Time: 09/16/2016 12:21 PM  Result Value Ref Range Status   Specimen Description BLOOD BLOOD RIGHT HAND  Final   Special Requests   Final    BOTTLES DRAWN AEROBIC AND ANAEROBIC Blood Culture results may not be optimal due to an inadequate volume of blood received in culture bottles   Culture NO GROWTH 2 DAYS  Final   Report Status PENDING  Incomplete  Surgical PCR screen     Status: Abnormal   Collection Time: 09/18/2016  6:31 PM  Result Value Ref Range Status   MRSA, PCR POSITIVE (A) NEGATIVE Final    Comment: RESULT CALLED TO, READ BACK BY AND VERIFIED WITH: CRYSTAL BASS AT 2041  09/19/2016 BY TFK    Staphylococcus aureus POSITIVE (A) NEGATIVE Final    Comment: (NOTE) The Xpert SA Assay (FDA approved for NASAL specimens in patients 15 years of age and older), is one component of a comprehensive surveillance program. It is not intended to diagnose infection nor to guide or monitor treatment.   Aerobic Culture (superficial specimen)     Status: None (Preliminary result)   Collection Time: 09/25/16  2:30 PM  Result Value Ref Range Status   Specimen Description FOOT  Final   Special Requests Normal  Final   Gram Stain   Final    RARE WBC PRESENT,BOTH PMN AND MONONUCLEAR RARE GRAM POSITIVE COCCI RARE GRAM NEGATIVE RODS Performed at [facility], [address], [place], [place] [address]    Culture PENDING  Incomplete   Report Status PENDING  Incomplete    Coagulation Studies: No results for input(s): LABPROT, INR in the last 72 hours.  Urinalysis: No results for input(s): COLORURINE, LABSPEC, PHURINE, GLUCOSEU, HGBUR, BILIRUBINUR, KETONESUR, PROTEINUR, UROBILINOGEN, NITRITE, LEUKOCYTESUR in the last 72 hours.  Invalid input(s): APPERANCEUR    Imaging: Dg Chest Port 1 View  Result Date: 09/25/2016 CLINICAL DATA:  Cough. History of diabetes and congestive heart failure. Former smoker. EXAM: PORTABLE CHEST 1 VIEW COMPARISON:  None. FINDINGS: Postoperative changes in the mediastinum. Cardiac enlargement with mild vascular congestion. No edema or consolidation. Small left pleural effusion with atelectasis or infiltration in the left lung base. Can't exclude pneumonia. Calcified and tortuous aorta. IMPRESSION: Cardiac enlargement with mild vascular congestion. No edema. Small left pleural effusion with left basilar infiltration or atelectasis. Electronically Signed   By: Burman Nieves M.D.   On: 09/25/2016 01:55   Dg Foot Complete Right  Result Date: 09/15/2016 CLINICAL DATA:  Diabetes, transmetatarsal amputation, soft tissue ulceration EXAM: RIGHT  FOOT COMPLETE - 3+ VIEW COMPARISON:  01/02/2009 FINDINGS: Large soft tissue ulceration of the transmetatarsal soft tissue site medially. No radiopaque foreign body. Transmetatarsal amputation noted. No definite acute bony destruction, periostitis, or fracture. Peripheral atherosclerosis noted. Bones are osteopenic. IMPRESSION: Large soft tissue ulceration of the transmetatarsal amputation soft tissues. No underlying acute or abnormal osseous finding by plain radiography. Peripheral atherosclerosis Electronically Signed   By: Judie Petit.  Shick M.D.   On: 10/03/2016 14:04   US Abdomen Limited Ruq  Result Date: 09/25/2016 CLINICAL DATA:  Elevated bilirubin, vomiting EXAM: ULTRASOUND ABDOMEN LIMITED RIGHT UPPER QUADRANT COMPARISON:  None. FINDINGS: Gallbladder: Gallbladder is distended. Sludge noted within the gallbladder. No gallbladder wall thickening or sonographic Murphy sign. Common bile duct: Diameter: Normal caliber, 3 mm Liver: Nodular contours of the liver compatible with cirrhosis. No focal  hepatic abnormality. No intrahepatic biliary duct dilatation. Portal vein is patent on color Doppler imaging with normal direction of blood flow towards the liver. IMPRESSION: Mild gallbladder distention with sludge noted. No visible stones, wall thickening or sonographic Murphy's sign. Changes of cirrhosis.  No focal hepatic abnormality. Electronically Signed   By: Charlett Nose M.D.   On: 09/25/2016 10:00     Medications:   . sodium chloride    . dextrose 40 mL/hr at 09/26/16 0458  . phenylephrine (NEO-SYNEPHRINE) Adult infusion Stopped (09/25/16 1447)  . piperacillin-tazobactam (ZOSYN)  IV Stopped (09/26/16 0152)   . aspirin EC  81 mg Oral Daily  . Chlorhexidine Gluconate Cloth  6 each Topical Q0600  . feeding supplement (ENSURE ENLIVE)  237 mL Oral BID BM  . heparin  5,000 Units Subcutaneous Q8H  . multivitamin  1 tablet Oral QHS  . mupirocin ointment  1 application Nasal BID  . sodium chloride flush  3 mL  Intravenous Q12H  . [START ON 09/30/2016] Vitamin D (Ergocalciferol)  50,000 Units Oral Q7 days   sodium chloride, acetaminophen **OR** acetaminophen, albuterol, HYDROcodone-acetaminophen, ondansetron **OR** ondansetron (ZOFRAN) IV, senna-docusate, sodium chloride flush  Assessment/ Plan:  66 y.o. male with diabetes, hypertension, coronary artery disease s/p CABG 2011, anemia of CKD, SHPTH, who presents now with b/l LE ulcerations  MWF [facility] [address]   1. End Stage Renal Disease:  Patient due for hemodialysis today per his usual schedule. However we do know that he is significantly hypotensive. We have started the patient back on pressors to maintain systolic blood pressure in the 90s. Would like to avoid CRRT and placing a catheter as he already has lower extremity ulcerations which may be infected.  2. Hypotension. As above we have started the patient on pressors. We would like to maintain systolic blood pressure in the 90s.  3. Anemia of chronic kidney disease. Initiate Epogen 10,000 units IV with dialysis.  4. Secondary Hyperparathyroidism:  We will plan to check intact PTH and phosphorus with dialysis today.  LOS: 2 Jovani Colquhoun 9/21/20188:11 AM

## 2016-09-26 NOTE — Progress Notes (Signed)
[facility] - [place] at [facility]   PATIENT NAME: Travis [name]    MR#:  [id number]  DATE OF BIRTH:  1950-05-24  SUBJECTIVE:   Patient Is on CRRT. Pressors were turned off however turned back on for dialysis. Patient does not talk very much today.  Had nausea and vomiting this morning. Unable to take in by mouth  REVIEW OF SYSTEMS:    Review of Systems  Constitutional: Overall not feeling well  HENT: Negative for ear pain, nosebleeds, congestion, facial swelling, rhinorrhea, neck pain, neck stiffness and ear discharge.   Respiratory: Negative for cough, shortness of breath, wheezing  Cardiovascular: Negative for chest pain, palpitations and leg swelling.  Gastrointestinal: Negative for heartburn, abdominal pain, ositos nausea and vomiting no diarrhea or constipation Genitourinary: Negative for dysuria, urgency, frequency, hematuria Musculoskeletal: Negative for back pain or joint pain Neurological: Negative for dizziness, seizures, syncope, focal weakness,  numbness and headaches.  Hematological: Does  bruise/bleed easily.  Psychiatric/Behavioral: Negative for hallucinations, confusion, dysphoric mood SKIN: b/l wound infections   Tolerating Diet:  No     DRUG ALLERGIES:  No Known Allergies  VITALS:  Blood pressure (!) 85/61, pulse 98, temperature 98.2 F (36.8 C), temperature source Oral, resp. rate 17, height  (1.676 m), weight 81.2 kg (179 lb 0.2 oz), SpO2 98 %.  PHYSICAL EXAMINATION:  Constitutional: Appears very ill appearing HENT: Normocephalic. Marland Kitchen Oropharynx is clear and moist.  Eyes: Conjunctivae and EOM are normal. PERRLA, no scleral icterus.  Neck: Normal ROM. Neck supple. No JVD. No tracheal deviation. CVS: RRR, S1/S2 +, no murmurs, no gallops, no carotid bruit.  Pulmonary: Effort and breath sounds normal, no stridor, rhonchi, wheezes, rales.  Abdominal: Soft. BS +,  no distension, tenderness, rebound or guarding.  Musculoskeletal:  Normal range of motion. No edema and no tenderness.  Neuro: Alert. CN 2-12 grossly intact. No focal deficits. Skin: b/l LE ulcerations Psychiatric: Normal mood and affect.      LABORATORY PANEL:   CBC  Recent Labs Lab 09/26/16 0208  WBC 12.6*  HGB 9.5*  HCT 28.6*  PLT 168   ------------------------------------------------------------------------------------------------------------------  Chemistries   Recent Labs Lab 09/13/2016 1221 09/25/16 0338 09/26/16 0208  NA 133* 131* 129*  K 2.6* 3.5 3.5  CL 98* 97* 94*  CO2 GLUCOSE 37* 61* 133*  BUN 15 22* 31*  CREATININE 3.00* 3.87* 4.67*  CALCIUM 8.5* 8.0* 8.5*  MG 1.8  --   --   AST 45*  --   --   ALT 20  --   --   ALKPHOS 124  --   --   BILITOT 1.9* 2.5*  --    ------------------------------------------------------------------------------------------------------------------  Cardiac Enzymes  Recent Labs Lab 09/25/16 1438 09/25/16 2138 09/26/16 0208  TROPONINI 1.43* 1.27* 1.38*   ------------------------------------------------------------------------------------------------------------------  RADIOLOGY:  Dg Chest Port 1 View  Result Date: 09/25/2016 CLINICAL DATA:  Cough. History of diabetes and congestive heart failure. Former smoker. EXAM: PORTABLE CHEST 1 VIEW COMPARISON:  None. FINDINGS: Postoperative changes in the mediastinum. Cardiac enlargement with mild vascular congestion. No edema or consolidation. Small left pleural effusion with atelectasis or infiltration in the left lung base. Can't exclude pneumonia. Calcified and tortuous aorta. IMPRESSION: Cardiac enlargement with mild vascular congestion. No edema. Small left pleural effusion with left basilar infiltration or atelectasis. Electronically Signed   By: Burman Nieves M.D.   On: 09/25/2016 01:55   Dg Foot Complete Right  Result Date: 09/16/2016 CLINICAL DATA:  Diabetes, transmetatarsal amputation, soft tissue ulceration EXAM: RIGHT  FOOT COMPLETE - 3+ VIEW COMPARISON:  01/02/2009 FINDINGS: Large soft tissue ulceration of the transmetatarsal soft tissue site medially. No radiopaque foreign body. Transmetatarsal amputation noted. No definite acute bony destruction, periostitis, or fracture. Peripheral atherosclerosis noted. Bones are osteopenic. IMPRESSION: Large soft tissue ulceration of the transmetatarsal amputation soft tissues. No underlying acute or abnormal osseous finding by plain radiography. Peripheral atherosclerosis Electronically Signed   By: Judie Petit.  Shick M.D.   On: 10/03/2016 14:04   US Abdomen Limited Ruq  Result Date: 09/25/2016 CLINICAL DATA:  Elevated bilirubin, vomiting EXAM: ULTRASOUND ABDOMEN LIMITED RIGHT UPPER QUADRANT COMPARISON:  None. FINDINGS: Gallbladder: Gallbladder is distended. Sludge noted within the gallbladder. No gallbladder wall thickening or sonographic Murphy sign. Common bile duct: Diameter: Normal caliber, 3 mm Liver: Nodular contours of the liver compatible with cirrhosis. No focal hepatic abnormality. No intrahepatic biliary duct dilatation. Portal vein is patent on color Doppler imaging with normal direction of blood flow towards the liver. IMPRESSION: Mild gallbladder distention with sludge noted. No visible stones, wall thickening or sonographic Murphy's sign. Changes of cirrhosis.  No focal hepatic abnormality. Electronically Signed   By: Charlett Nose M.D.   On: 09/25/2016 10:00     ASSESSMENT AND PLAN:   66 y.o. male CAD s/p CABG, hx of STEMI, T2DM, ESRD on HD, PVD w/ a hx of bilateral toe and transmetatarsal amputation and chronic non-healing LE wounds. He is followed in [facility] wound clinic complicated by medical non-compliance presented to [facility] ED upon referral from hemodialysis for hypoglycemia and B/L lower extremity wounds.   1. Septic shock with hypotension, tachycardia and fevers from bilateral LE wounds Patient is back on pressors for dialysis Continue broad-spectrum  antibiotics Blood cultures are negative  2. Bilateral lower extremity wounds: Patient is followed at [facility] wound clinic. He has extensive eschar on all wounds. Patient has been seen and evaluated by surgery as well as podiatry and vascular surgery.  Patient is high risk for above-knee amputations and does not desire above-knee amputations. As per vascular surgery no role for debridement for local procedures for angiogram at this point. If at any point the patient desires to have amputation and vascular surgery can perform it here or patient may follow-up with his vascular surgeon at [facility] and wound care physicians at [facility].   Management as per wound care and attention as follows To posterior right lower leg and anterior and posterior left lower leg wounds, cleanse wounds with NS, pat dry, apply large Xeroform, cover  wounds with ABDs, wrap with kerlix, adhere kerlix to kerlix, no tape on skin, perform daily and prn soiling. Prevalon boots to prevent foot ulcers. Foam dressing to sacral ulcer, change Q3D and prn soiling. Peel back and assess Q shift.  Continue vancomycin and Zosyn ID consultation not available this weekend. Wound culture pending  3. Hypoglycemia: Currently off of D10 drip  4. ESRD on hemodialysis: Currently on CRRT as per nephrology consultation  5. Chronic diastolic heart failure with preserved ejection fraction/chronic ischemic heart disease with history of CABG: Continue to monitor No signs of acute exacerbation at this time.  6. Hypokalemia: This has been repleted  7. Hyponatremia: Continue to monitor  8. Anemia of chronic disease: Monitor hemoglobin  9. Elevated bilirubin: Abdominal ultrasound without evidence of acute abdominal pathology.  10. Peripheral vascular disease: Continue aspirin and Plavix  11. Essential hypertension due to severe sepsis with hypotension blood pressure medications discontinued for now  12.  Chronic atrial fibrillation:CHADS2VASC score  4 As per his cardiologist he is not a candidate for anticoagulation given his renal disease and noncompliance with increased risk of high bleeding and falls   13. Elevated troponin in the setting of septic shock: Consider cardiology evaluation and ECHO 14. Nausea and vomiting: Order KUB   Patient is critically ill. Patient needs ICU and palliative care consult  Management plans discussed with the patient and he is in agreement.  CODE STATUS: FULL  Critical care TOTAL TIME TAKING CARE OF THIS PATIENT: 35 minutes.  D/w intensivist   POSSIBLE D/C ??, DEPENDING ON CLINICAL CONDITION.   Sher Hellinger M.D on 09/26/2016 at 11:10 AM  Between 7am to 6pm - Pager - [phone] After 6pm go to [email or web address] - Social research officer, government  [facility]  Office  [phone]  CC: Primary care physician; Center, [facility]  Note: This dictation was prepared with Nurse, children's dictation along with smaller phrase technology. Any transcriptional errors that result from this process are unintentional.

## 2016-09-26 NOTE — Progress Notes (Signed)
[facility] Critical Care Medicine Progess Note    SYNOPSIS   66 y o with ESRD, PVD, gangrenous foot ulcers, septic shock.   ASSESSMENT/PLAN  ASSESSMENT / PLAN: Severe Septic Shock with hypotension likely secondary to bilateral lower extremity wounds-- Doing better, has been weaned off pressors.  Elevated bilirubin Anemia without acute blood loss ESRD on hemodialysis  Mildly elevated troponin likely secondary to demand ischemia Atrial Fibrillation-rate controlled  P: Prn peripheral neo-synephrine gtt to maintain map >60 during HD. Discussed with nephrology.  Trend PCT and lactic acid Follow cultures Continue abx ID, Vascular, Nephrology, General Surgery, and Podiatry consulted appreciate input  Trend troponin Continuous telemetry monitoring  Continue D10W  ml/hr will d/c SSI  CBG's q2hrs Prn norco for pain management  Subq heparin for VTE prophylaxis    Micro/culture results:  BCx2 NTD UC -- Sputum--  Antibiotics: Zosyn 9/19>>  MAJOR EVENTS/TEST RESULTS:   Best Practices  DVT Prophylaxis: heparin GI Prophylaxis: --   ---------------------------------------   ----------------------------------------   Name: Travis [name] MRN: [id number] DOB: Feb 10, 1950    ADMISSION DATE:  10-Oct-2016   SUBJECTIVE:   No new complaints today, pt wants to go home.    Review of Systems:  Constitutional: Feels well. Cardiovascular: No chest pain.  Pulmonary: Denies dyspnea.   10 point review of systems was negative  other than what is documented in the HPI.    VITAL SIGNS: Temp:  [96.1 F (35.6 C)-98.6 F (37 C)] 98.6 F (37 C) (09/21 0700) Pulse Rate:  [65-94] 86 (09/21 0800) Resp:  [14-27] 20 (09/21 0800) BP: (67-102)/(46-71) 99/56 (09/21 0800) SpO2:  [75 %-100 %] 100 % (09/21 0800) Weight:  [179 lb 0.2 oz (81.2 kg)-182 lb 1.6 oz (82.6 kg)] 179 lb 0.2 oz (81.2 kg) (09/21 0458)     PHYSICAL EXAMINATION: Physical Examination:   VS: BP (!) 99/56    Pulse 86   Temp 98.6 F (37 C)   Resp 20   Ht  (1.676 m)   Wt 179 lb 0.2 oz (81.2 kg)   SpO2 100%   BMI 28.89 kg/m   General Appearance: No distress  Neuro:without focal findings, mental status normal. HEENT: PERRLA, EOM intact. Pulmonary: normal breath sounds   CardiovascularNormal S1,S2.  No m/r/g.   Abdomen: Benign, Soft, non-tender. Renal:  No costovertebral tenderness  GU:  Not performed at this time. Endocrine: No evident thyromegaly. Skin:   warm, no rashes, no ecchymosis  Extremities: --    LABORATORY PANEL:   CBC  Recent Labs Lab 09/26/16 0208  WBC 12.6*  HGB 9.5*  HCT 28.6*  PLT 168    Chemistries   Recent Labs Lab 10-Oct-2016 1221 09/25/16 0338 09/26/16 0208  NA 133* 131* 129*  K 2.6* 3.5 3.5  CL 98* 97* 94*  CO2 GLUCOSE 37* 61* 133*  BUN 15 22* 31*  CREATININE 3.00* 3.87* 4.67*  CALCIUM 8.5* 8.0* 8.5*  MG 1.8  --   --   AST 45*  --   --   ALT 20  --   --   ALKPHOS 124  --   --   BILITOT 1.9* 2.5*  --      Recent Labs Lab 09/25/16 2202 09/25/16 2357 09/26/16 0221 09/26/16 0448 09/26/16 0644 09/26/16 0752  GLUCAP 127* 123* 125* 116* 116* 121*   No results for input(s): PHART, PCO2ART, PO2ART in the last 168 hours.  Recent Labs Lab Oct 10, 2016 1221 09/25/16 0338  AST 45*  --  ALT 20  --   ALKPHOS 124  --   BILITOT 1.9* 2.5*  ALBUMIN 2.3*  --     Cardiac Enzymes  Recent Labs Lab 09/26/16 0208  TROPONINI 1.38*    RADIOLOGY:  Dg Chest Port 1 View  Result Date: 09/25/2016 CLINICAL DATA:  Cough. History of diabetes and congestive heart failure. Former smoker. EXAM: PORTABLE CHEST 1 VIEW COMPARISON:  None. FINDINGS: Postoperative changes in the mediastinum. Cardiac enlargement with mild vascular congestion. No edema or consolidation. Small left pleural effusion with atelectasis or infiltration in the left lung base. Can't exclude pneumonia. Calcified and tortuous aorta. IMPRESSION: Cardiac enlargement with mild  vascular congestion. No edema. Small left pleural effusion with left basilar infiltration or atelectasis. Electronically Signed   By: Burman Nieves M.D.   On: 09/25/2016 01:55   Dg Foot Complete Right  Result Date: 2016/10/16 CLINICAL DATA:  Diabetes, transmetatarsal amputation, soft tissue ulceration EXAM: RIGHT FOOT COMPLETE - 3+ VIEW COMPARISON:  01/02/2009 FINDINGS: Large soft tissue ulceration of the transmetatarsal soft tissue site medially. No radiopaque foreign body. Transmetatarsal amputation noted. No definite acute bony destruction, periostitis, or fracture. Peripheral atherosclerosis noted. Bones are osteopenic. IMPRESSION: Large soft tissue ulceration of the transmetatarsal amputation soft tissues. No underlying acute or abnormal osseous finding by plain radiography. Peripheral atherosclerosis Electronically Signed   By: Judie Petit.  Shick M.D.   On: 10-16-2016 14:04   US Abdomen Limited Ruq  Result Date: 09/25/2016 CLINICAL DATA:  Elevated bilirubin, vomiting EXAM: ULTRASOUND ABDOMEN LIMITED RIGHT UPPER QUADRANT COMPARISON:  None. FINDINGS: Gallbladder: Gallbladder is distended. Sludge noted within the gallbladder. No gallbladder wall thickening or sonographic Murphy sign. Common bile duct: Diameter: Normal caliber, 3 mm Liver: Nodular contours of the liver compatible with cirrhosis. No focal hepatic abnormality. No intrahepatic biliary duct dilatation. Portal vein is patent on color Doppler imaging with normal direction of blood flow towards the liver. IMPRESSION: Mild gallbladder distention with sludge noted. No visible stones, wall thickening or sonographic Murphy's sign. Changes of cirrhosis.  No focal hepatic abnormality. Electronically Signed   By: Charlett Nose M.D.   On: 09/25/2016 10:00        --Wells Guiles, MD.  ICU Pager: [phone] [facility] Office Number: [phone]   09/26/2016

## 2016-09-26 NOTE — Consult Note (Signed)
[facility] Face-to-Face Psychiatry Consult   Reason for Consult:  Consult for 66 year old man with severe medical problems. Concern about depression Referring Physician:  Mody Patient Identification: Travis [name] MRN:  [id number] Principal Diagnosis: Depression Diagnosis:   Patient Active Problem List   Diagnosis Date Noted  . Pressure injury of skin [L89.90] 09/26/2016  . Depression [F32.9] 09/26/2016  . Anorexia [R63.0] 09/26/2016  . Subacute delirium [F05] 09/26/2016  . Cellulitis of leg [P32.951] 09/25/2016  . Unstable angina ([place]) [I20.0] 02/21/2015    Total Time spent with patient: 1 hour  Subjective:   SCOT Travis [name] is a 66 y.o. male patient admitted with "I do not want to live with this condition".  HPI:  Patient interviewed chart reviewed. Hospital provided Spanish language interpreter assisted. On duty critical care nurse also assisted. 66 year old man with multiple severe chronic medical problems. End-stage renal disease, cellulitis of the legs diabetes on dialysis possibility of amputation. Now hypotensive is well requiring pressors. Patient is reported not to have been eating or drinking in about 2 days. Hardly interacting. On interview today the patient was only intermittently cooperative. He would fall asleep easily. Sometimes he would answer questions lucidly but often would then trail off into long answers full of vague spiritual comments. [name]. He says that if he cannot do things with his legs anymore he would rather not live. He tells me however that the reason he is not eating or drinking is that he is afraid he will throw up which she did last time. Answers are not entirely coherent. He also talks about not wanting to be a burden to his family or to bother the nurses with having them try to feed him. Patient seems to indicate that he has reconciled himself to dying. Does not indicate hallucinations. Doesn't seem  to be psychotic. Attention however waxes and wanes in and out.  Social history: I am told that the patient is married to a much younger woman and has a young child at home. He makes positive comments about his family but indicates that he does not want to be a burden.  Medical history: Patient has diabetes and end-stage renal disease heart disease he is on dialysis he is here for continued surgical treatment of wounds on the legs with the possibility of amputation.  Substance abuse history: Nothing in the chart about it and the patient denies having a significant substance abuse problem  Past Psychiatric History: No indication of any past psychiatric history. Patient denies any past psychiatric history. Nothing to indicate past treatment of depression. No history of suicide attempts or violence.  Risk to Self: Is patient at risk for suicide?: No Risk to Others:   Prior Inpatient Therapy:   Prior Outpatient Therapy:    Past Medical History:  Past Medical History:  Diagnosis Date  . CHF (congestive heart failure) ([place])   . Chronic kidney disease   . Coronary artery disease   . Diabetes mellitus without complication ([place])   . Dialysis patient ([place])   . Osteomyelitis [facility])     Past Surgical History:  Procedure Laterality Date  . A/V FISTULAGRAM Left 03/03/2016   Procedure: A/V Fistulagram;  Surgeon: Algernon Huxley, MD;  Location: [place] [facility];  Service: Cardiovascular;  Laterality: Left;  . A/V SHUNT INTERVENTION N/A 03/03/2016   Procedure: A/V Shunt Intervention;  Surgeon: Algernon Huxley, MD;  Location: [place] [facility];  Service: Cardiovascular;  Laterality: N/A;  . ARTERIAL THROMBECTOMY    . CARDIAC SURGERY    . FOOT SURGERY Right   . VASCULAR SURGERY     Family History: No family history on file. Family Psychiatric  History: Unknown patient does not know of any Social History:  History  Alcohol Use No     History  Drug Use No    Social History   Social History  .  Marital status: Married    Spouse name: N/A  . Number of children: N/A  . Years of education: N/A   Social History Main Topics  . Smoking status: Former Research scientist (life sciences)  . Smokeless tobacco: Never Used  . Alcohol use No  . Drug use: No  . Sexual activity: Not Asked   Other Topics Concern  . None   Social History Narrative  . None   Additional Social History:    Allergies:  No Known Allergies  Labs:  Results for orders placed or performed during the hospital encounter of 09/07/2016 (from the past 48 hour(s))  Glucose, capillary     Status: None   Collection Time: 09/26/2016  8:00 PM  Result Value Ref Range   Glucose-Capillary 70 65 - 99 mg/dL  Glucose, capillary     Status: Abnormal   Collection Time: 09/14/2016  9:29 PM  Result Value Ref Range   Glucose-Capillary 57 (L) 65 - 99 mg/dL  Glucose, capillary     Status: None   Collection Time: 09/20/2016 11:05 PM  Result Value Ref Range   Glucose-Capillary 65 65 - 99 mg/dL  Glucose, capillary     Status: Abnormal   Collection Time: 09/25/16  1:03 AM  Result Value Ref Range   Glucose-Capillary 52 (L) 65 - 99 mg/dL  Basic metabolic panel     Status: Abnormal   Collection Time: 09/25/16  3:38 AM  Result Value Ref Range   Sodium 131 (L) 135 - 145 mmol/L   Potassium 3.5 3.5 - 5.1 mmol/L   Chloride 97 (L) 101 - 111 mmol/L   CO2 24 22 - 32 mmol/L   Glucose, Bld 61 (L) 65 - 99 mg/dL   BUN 22 (H) 6 - 20 mg/dL   Creatinine, Ser 3.87 (H) 0.61 - 1.24 mg/dL   Calcium 8.0 (L) 8.9 - 10.3 mg/dL   GFR calc non Af Amer 15 (L) >60 mL/min   GFR calc Af Amer 17 (L) >60 mL/min    Comment: (NOTE) The eGFR has been calculated using the CKD EPI equation. This calculation has not been validated in all clinical situations. eGFR's persistently <60 mL/min signify possible Chronic Kidney Disease.    Anion gap 10 5 - 15  CBC     Status: Abnormal   Collection Time: 09/25/16  3:38 AM  Result Value Ref Range   WBC 9.5 3.8 - 10.6 K/uL   RBC 2.70 (L) 4.40 -  5.90 MIL/uL   Hemoglobin 9.5 (L) 13.0 - 18.0 g/dL   HCT 27.3 (L) 40.0 - 52.0 %   MCV 101.3 (H) 80.0 - 100.0 fL   MCH 35.2 (H) 26.0 - 34.0 pg   MCHC 34.7 32.0 - 36.0 g/dL   RDW 16.0 (H) 11.5 - 14.5 %   Platelets 151 150 - 440 K/uL  Bilirubin, total     Status: Abnormal   Collection Time: 09/25/16  3:38 AM  Result Value Ref Range   Total Bilirubin 2.5 (H) 0.3 - 1.2 mg/dL  Glucose, capillary     Status: None  Collection Time: 09/25/16  4:31 AM  Result Value Ref Range   Glucose-Capillary 78 65 - 99 mg/dL  Glucose, capillary     Status: Abnormal   Collection Time: 09/25/16  6:23 AM  Result Value Ref Range   Glucose-Capillary 103 (H) 65 - 99 mg/dL  Glucose, capillary     Status: None   Collection Time: 09/25/16  7:59 AM  Result Value Ref Range   Glucose-Capillary 86 65 - 99 mg/dL  Glucose, capillary     Status: Abnormal   Collection Time: 09/25/16  9:45 AM  Result Value Ref Range   Glucose-Capillary 100 (H) 65 - 99 mg/dL   Comment 1 Notify RN   Glucose, capillary     Status: Abnormal   Collection Time: 09/25/16 11:51 AM  Result Value Ref Range   Glucose-Capillary 100 (H) 65 - 99 mg/dL   Comment 1 Notify RN   Lactic acid, plasma     Status: None   Collection Time: 09/25/16 12:35 PM  Result Value Ref Range   Lactic Acid, Venous 1.6 0.5 - 1.9 mmol/L  Glucose, capillary     Status: None   Collection Time: 09/25/16  2:05 PM  Result Value Ref Range   Glucose-Capillary 86 65 - 99 mg/dL  Aerobic Culture (superficial specimen)     Status: None (Preliminary result)   Collection Time: 09/25/16  2:30 PM  Result Value Ref Range   Specimen Description FOOT    Special Requests Normal    Gram Stain      RARE WBC PRESENT,BOTH PMN AND MONONUCLEAR RARE GRAM POSITIVE COCCI RARE GRAM NEGATIVE RODS    Culture      CULTURE REINCUBATED FOR BETTER GROWTH Performed at [facility], [place] [address] [place], [place] [address]    Report Status PENDING   Lactic acid, plasma     Status:  None   Collection Time: 09/25/16  2:38 PM  Result Value Ref Range   Lactic Acid, Venous 1.7 0.5 - 1.9 mmol/L  Procalcitonin - Baseline     Status: None   Collection Time: 09/25/16  2:38 PM  Result Value Ref Range   Procalcitonin 21.06 ng/mL    Comment:        Interpretation: PCT >= 10 ng/mL: Important systemic inflammatory response, almost exclusively due to severe bacterial sepsis or septic shock. (NOTE)         ICU PCT Algorithm               Non ICU PCT Algorithm    ----------------------------     ------------------------------         PCT < 0.25 ng/mL                 PCT < 0.1 ng/mL     Stopping of antibiotics            Stopping of antibiotics       strongly encouraged.               strongly encouraged.    ----------------------------     ------------------------------       PCT level decrease by               PCT < 0.25 ng/mL       >= 80% from peak PCT       OR PCT 0.25 - 0.5 ng/mL          Stopping of antibiotics  encouraged.     Stopping of antibiotics           encouraged.    ----------------------------     ------------------------------       PCT level decrease by              PCT >= 0.25 ng/mL       < 80% from peak PCT        AND PCT >= 0.5 ng/mL             Continuing antibiotics                                              encouraged.       Continuing antibiotics            encouraged.    ----------------------------     ------------------------------     PCT level increase compared          PCT > 0.5 ng/mL         with peak PCT AND          PCT >= 0.5 ng/mL             Escalation of antibiotics                                          strongly encouraged.      Escalation of antibiotics        strongly encouraged.   Troponin I (q 6hr x 3)     Status: Abnormal   Collection Time: 09/25/16  2:38 PM  Result Value Ref Range   Troponin I 1.43 (HH) <0.03 ng/mL    Comment: CRITICAL VALUE NOTED. VALUE IS CONSISTENT WITH  PREVIOUSLY REPORTED/CALLED VALUE KLW  Glucose, capillary     Status: Abnormal   Collection Time: 09/25/16  5:11 PM  Result Value Ref Range   Glucose-Capillary 105 (H) 65 - 99 mg/dL  Glucose, capillary     Status: Abnormal   Collection Time: 09/25/16  6:17 PM  Result Value Ref Range   Glucose-Capillary 106 (H) 65 - 99 mg/dL  Glucose, capillary     Status: Abnormal   Collection Time: 09/25/16  7:26 PM  Result Value Ref Range   Glucose-Capillary 107 (H) 65 - 99 mg/dL  Troponin I (q 6hr x 3)     Status: Abnormal   Collection Time: 09/25/16  9:38 PM  Result Value Ref Range   Troponin I 1.27 (HH) <0.03 ng/mL    Comment: CRITICAL VALUE NOTED. VALUE IS CONSISTENT WITH PREVIOUSLY REPORTED/CALLED VALUE ALV  Glucose, capillary     Status: Abnormal   Collection Time: 09/25/16 10:02 PM  Result Value Ref Range   Glucose-Capillary 127 (H) 65 - 99 mg/dL  Glucose, capillary     Status: Abnormal   Collection Time: 09/25/16 11:57 PM  Result Value Ref Range   Glucose-Capillary 123 (H) 65 - 99 mg/dL  Troponin I (q 6hr x 3)     Status: Abnormal   Collection Time: 09/26/16  2:08 AM  Result Value Ref Range   Troponin I 1.38 (HH) <0.03 ng/mL    Comment: CRITICAL VALUE NOTED. VALUE IS CONSISTENT WITH PREVIOUSLY REPORTED/CALLED VALUE ALV  CBC     Status: Abnormal  Collection Time: 09/26/16  2:08 AM  Result Value Ref Range   WBC 12.6 (H) 3.8 - 10.6 K/uL   RBC 2.85 (L) 4.40 - 5.90 MIL/uL   Hemoglobin 9.5 (L) 13.0 - 18.0 g/dL   HCT 02.0 (L) 92.1 - 34.8 %   MCV 100.3 (H) 80.0 - 100.0 fL   MCH 33.4 26.0 - 34.0 pg   MCHC 33.3 32.0 - 36.0 g/dL   RDW 75.0 (H) 32.5 - 60.1 %   Platelets 168 150 - 440 K/uL  Basic metabolic panel     Status: Abnormal   Collection Time: 09/26/16  2:08 AM  Result Value Ref Range   Sodium 129 (L) 135 - 145 mmol/L   Potassium 3.5 3.5 - 5.1 mmol/L   Chloride 94 (L) 101 - 111 mmol/L   CO2 23 22 - 32 mmol/L   Glucose, Bld 133 (H) 65 - 99 mg/dL   BUN 31 (H) 6 - 20 mg/dL    Creatinine, Ser 2.94 (H) 0.61 - 1.24 mg/dL   Calcium 8.5 (L) 8.9 - 10.3 mg/dL   GFR calc non Af Amer 12 (L) >60 mL/min   GFR calc Af Amer 14 (L) >60 mL/min    Comment: (NOTE) The eGFR has been calculated using the CKD EPI equation. This calculation has not been validated in all clinical situations. eGFR's persistently <60 mL/min signify possible Chronic Kidney Disease.    Anion gap 12 5 - 15  Procalcitonin     Status: None   Collection Time: 09/26/16  2:08 AM  Result Value Ref Range   Procalcitonin 28.05 ng/mL    Comment:        Interpretation: PCT >= 10 ng/mL: Important systemic inflammatory response, almost exclusively due to severe bacterial sepsis or septic shock. (NOTE)         ICU PCT Algorithm               Non ICU PCT Algorithm    ----------------------------     ------------------------------         PCT < 0.25 ng/mL                 PCT < 0.1 ng/mL     Stopping of antibiotics            Stopping of antibiotics       strongly encouraged.               strongly encouraged.    ----------------------------     ------------------------------       PCT level decrease by               PCT < 0.25 ng/mL       >= 80% from peak PCT       OR PCT 0.25 - 0.5 ng/mL          Stopping of antibiotics                                             encouraged.     Stopping of antibiotics           encouraged.    ----------------------------     ------------------------------       PCT level decrease by              PCT >= 0.25 ng/mL       < 80%  from peak PCT        AND PCT >= 0.5 ng/mL             Continuing antibiotics                                              encouraged.       Continuing antibiotics            encouraged.    ----------------------------     ------------------------------     PCT level increase compared          PCT > 0.5 ng/mL         with peak PCT AND          PCT >= 0.5 ng/mL             Escalation of antibiotics                                           strongly encouraged.      Escalation of antibiotics        strongly encouraged.   Glucose, capillary     Status: Abnormal   Collection Time: 09/26/16  2:21 AM  Result Value Ref Range   Glucose-Capillary 125 (H) 65 - 99 mg/dL  Glucose, capillary     Status: Abnormal   Collection Time: 09/26/16  4:48 AM  Result Value Ref Range   Glucose-Capillary 116 (H) 65 - 99 mg/dL  Glucose, capillary     Status: Abnormal   Collection Time: 09/26/16  6:44 AM  Result Value Ref Range   Glucose-Capillary 116 (H) 65 - 99 mg/dL  Glucose, capillary     Status: Abnormal   Collection Time: 09/26/16  7:52 AM  Result Value Ref Range   Glucose-Capillary 121 (H) 65 - 99 mg/dL  Glucose, capillary     Status: Abnormal   Collection Time: 09/26/16  9:59 AM  Result Value Ref Range   Glucose-Capillary 109 (H) 65 - 99 mg/dL  Glucose, capillary     Status: None   Collection Time: 09/26/16 11:37 AM  Result Value Ref Range   Glucose-Capillary 94 65 - 99 mg/dL  Glucose, capillary     Status: None   Collection Time: 09/26/16  3:22 PM  Result Value Ref Range   Glucose-Capillary 95 65 - 99 mg/dL    Current Facility-Administered Medications  Medication Dose Route Frequency Provider Last Rate Last Dose  . 0.9 %  sodium chloride infusion  250 mL Intravenous PRN Shaune Pollack, MD      . acetaminophen (TYLENOL) tablet 650 mg  650 mg Oral Q6H PRN Shaune Pollack, MD   650 mg at 09/06/2016 2030   Or  . acetaminophen (TYLENOL) suppository 650 mg  650 mg Rectal Q6H PRN Shaune Pollack, MD      . albuterol (PROVENTIL) (2.5 MG/3ML) 0.083% nebulizer solution 2.5 mg  2.5 mg Nebulization Q2H PRN Shaune Pollack, MD      . aspirin EC tablet 81 mg  81 mg Oral Daily Shaune Pollack, MD   81 mg at 09/26/16 1453  . Chlorhexidine Gluconate Cloth 2 % PADS 6 each  6 each Topical Q0600 Adrian Saran, MD   6 each at 09/26/16 0600  . dextrose 10 % infusion  Intravenous Continuous Cherylann Ratel, [place], MD 40 mL/hr at 09/26/16 0458    . epoetin alfa (EPOGEN,PROCRIT)  injection 10,000 Units  10,000 Units Intravenous Q M,W,F-HD Cherylann Ratel, [place], MD   10,000 Units at 09/26/16 1150  . feeding supplement (ENSURE ENLIVE) (ENSURE ENLIVE) liquid 237 mL  237 mL Oral BID BM Mody, Sital, MD   237 mL at 09/25/16 1433  . heparin injection 5,000 Units  5,000 Units Subcutaneous Q8H Shaune Pollack, MD   5,000 Units at 09/26/16 1453  . HYDROcodone-acetaminophen (NORCO/VICODIN) 5-325 MG per tablet 1-2 tablet  1-2 tablet Oral Q4H PRN Shaune Pollack, MD   2 tablet at 09/25/16 0346  . mirtazapine (REMERON SOL-TAB) disintegrating tablet 7.5 mg  7.5 mg Oral QHS Giancarlos Berendt T, MD      . multivitamin (RENA-VIT) tablet 1 tablet  1 tablet Oral QHS Mody, Sital, MD      . mupirocin ointment (BACTROBAN) 2 % 1 application  1 application Nasal BID Adrian Saran, MD   1 application at 09/26/16 0944  . ondansetron (ZOFRAN) tablet 4 mg  4 mg Oral Q6H PRN Shaune Pollack, MD       Or  . ondansetron [facility]) injection 4 mg  4 mg Intravenous Q6H PRN Shaune Pollack, MD   4 mg at 09/26/16 1453  . phenylephrine (NEO-SYNEPHRINE) 10 mg in sodium chloride 0.9 % 250 mL (0.04 mg/mL) infusion  0-400 mcg/min Intravenous Titrated Eugenie Norrie, NP   Stopped at 09/26/16 1529  . piperacillin-tazobactam (ZOSYN) IVPB 3.375 g  3.375 g Intravenous Q12H Cindi Carbon, RPH 12.5 mL/hr at 09/26/16 1454 3.375 g at 09/26/16 1454  . senna-docusate (Senokot-S) tablet 1 tablet  1 tablet Oral QHS PRN Eugenie Norrie, NP      . sodium chloride flush (NS) 0.9 % injection 3 mL  3 mL Intravenous Q12H Shaune Pollack, MD   3 mL at 09/25/16 2154  . sodium chloride flush (NS) 0.9 % injection 3 mL  3 mL Intravenous PRN Shaune Pollack, MD      . Melene Muller ON 09/30/2016] Vitamin D (Ergocalciferol) (DRISDOL) capsule 50,000 Units  50,000 Units Oral Q7 days Shaune Pollack, MD        Musculoskeletal: Strength & Muscle Tone: decreased Gait & Station: unable to stand Patient leans: Backward  Psychiatric Specialty Exam: Physical Exam  Nursing note and vitals  reviewed. Constitutional: He appears well-developed. He appears distressed.  HENT:  Head: Normocephalic and atraumatic.  Eyes: Pupils are equal, round, and reactive to light. Conjunctivae are normal.  Neck: Normal range of motion.  Cardiovascular: Regular rhythm and normal heart sounds.   Hypotensive  Respiratory: He is in respiratory distress.  GI: Soft.  Musculoskeletal: Normal range of motion.  Neurological: He is alert.  Skin: Skin is warm and dry.     Psychiatric: His affect is blunt. His speech is delayed. He is slowed and withdrawn. Thought content is not paranoid. Cognition and memory are impaired. He expresses inappropriate judgment. He expresses suicidal ideation. He expresses no suicidal plans. He is noncommunicative. He exhibits abnormal recent memory. He is inattentive.    Review of Systems  Constitutional: Positive for malaise/fatigue and weight loss.  HENT: Negative.   Eyes: Negative.   Respiratory: Negative.   Cardiovascular: Negative.   Gastrointestinal: Positive for nausea and vomiting.  Musculoskeletal: Negative.   Skin: Negative.   Neurological: Positive for sensory change and weakness.  Psychiatric/Behavioral: Positive for memory loss and suicidal ideas. Negative for depression, hallucinations and substance abuse. The patient  has insomnia. The patient is not nervous/anxious.     Blood pressure (!) 83/69, pulse 98, temperature 98 F (36.7 C), temperature source Oral, resp. rate (!) 36, height '5\' 6"'$  (1.676 m), weight 81.2 kg (179 lb 0.2 oz), SpO2 92 %.Body mass index is 28.89 kg/m.  General Appearance: Casual  Eye Contact:  Minimal  Speech:  Garbled and Slow  Volume:  Decreased  Mood:  Dysphoric and Hopeless  Affect:  Constricted  Thought Process:  Disorganized  Orientation:  Other:  Hard to tell. I think he knows he is in the hospital but he is not able to rouse the energy to make more specific answers to these questions  Thought Content:  Illogical,  Rumination and Tangential  Suicidal Thoughts:  Yes.  without intent/plan  Homicidal Thoughts:  No  Memory:  Immediate;   Fair Recent;   Poor Remote;   Fair  Judgement:  Impaired  Insight:  Shallow  Psychomotor Activity:  Decreased  Concentration:  Concentration: Poor  Recall:  [employer] of Knowledge:  Fair  Language:  Fair  Akathisia:  No  Handed:  Right  AIMS (if indicated):     Assets:  Housing Social Support  ADL's:  Impaired  Cognition:  Impaired,  Moderate  Sleep:        Treatment Plan Summary: Plan 66 year old man with severe and potentially end-stage medical problems currently in the critical care unit. Patient is not able to give a very coherent interview but multiple things about his condition suggests the possibility of some degree of depression. He seems to of reconciled himself to death. He talks a lot about not wanting to bother people to take care of him. He has excuses for why he is not eating and drinking but does not seem to be responding to motivation to try harder. At the same time he seems to be a little bit delirious in and out and I'm not sure how much he has the ability to arouse himself to more activity. Under the circumstances it's unclear how much treatment of depression is going to improve the situation. He also is probably just feeling overwhelmed exhausted and sick from his medical problems. I am going to try adding 7.5 mg of mirtazapine dissolving tablet in the mouth at night which can be given even though he is not cooperating with swallowing pills. Might help sleep might possibly help appetite. Conceivably might help mood. Antidepressants in any case take weeks to take effect which is probably longer than the timeline on which this gentleman needs to regain his strength. I will continue to try to follow-up regularly.  Disposition: Supportive therapy provided about ongoing stressors.  Alethia Berthold, MD 09/26/2016 7:55 PM

## 2016-09-26 NOTE — Progress Notes (Signed)
Per Dr Cherylann Ratel turned neo on for dialysis treatment.

## 2016-09-26 NOTE — Progress Notes (Signed)
Patient blood pressure has consistently trended with MAP slightly above 60. Patient's MAP goal is 60. Notified ICU NP of blood pressure trend earlier in beginning of shift. Patients neurological status at baseline for shift, all other vital signs stable. No new orders received.   Followed up with ICU NP of systolic blood pressure less than 80, but MAP still above 60. Patient neurological status at baseline for shift, other vital signs stable. Will continue to monitor and assess patient closely and report any changes to ICU NP.

## 2016-09-26 NOTE — Progress Notes (Signed)
Palliative Medicine consult noted. Due to high referral volume, this patient will be seen Monday 9/24. Please call the Palliative Medicine Team office at [phone] if recommendations are needed in the interim.  Thank you for inviting [place] to see this patient.  Margret Chance Cylas Falzone, RN, BSN, [facility] 09/26/2016 3:58 PM Cell [phone] 8:00-4:00 Monday-Friday Office [phone]

## 2016-09-27 DIAGNOSIS — I739 Peripheral vascular disease, unspecified: Secondary | ICD-10-CM

## 2016-09-27 DIAGNOSIS — L97923 Non-pressure chronic ulcer of unspecified part of left lower leg with necrosis of muscle: Secondary | ICD-10-CM

## 2016-09-27 DIAGNOSIS — L97913 Non-pressure chronic ulcer of unspecified part of right lower leg with necrosis of muscle: Secondary | ICD-10-CM

## 2016-09-27 LAB — CBC
HCT: 27.3 % — ABNORMAL LOW (ref 40.0–52.0)
Hemoglobin: 9.3 g/dL — ABNORMAL LOW (ref 13.0–18.0)
MCH: 34.8 pg — ABNORMAL HIGH (ref 26.0–34.0)
MCHC: 34.1 g/dL (ref 32.0–36.0)
MCV: 102 fL — AB (ref 80.0–100.0)
Platelets: 138 10*3/uL — ABNORMAL LOW (ref 150–440)
RBC: 2.67 MIL/uL — AB (ref 4.40–5.90)
RDW: 16.5 % — ABNORMAL HIGH (ref 11.5–14.5)
WBC: 11.3 10*3/uL — AB (ref 3.8–10.6)

## 2016-09-27 LAB — BASIC METABOLIC PANEL
Anion gap: 9 (ref 5–15)
BUN: 23 mg/dL — ABNORMAL HIGH (ref 6–20)
CHLORIDE: 98 mmol/L — AB (ref 101–111)
CO2: 24 mmol/L (ref 22–32)
CREATININE: 3.51 mg/dL — AB (ref 0.61–1.24)
Calcium: 8.5 mg/dL — ABNORMAL LOW (ref 8.9–10.3)
GFR, EST AFRICAN AMERICAN: 19 mL/min — AB (ref >60)
GFR, EST NON AFRICAN AMERICAN: 17 mL/min — AB (ref >60)
Glucose, Bld: 128 mg/dL — ABNORMAL HIGH (ref 65–99)
Potassium: 3.3 mmol/L — ABNORMAL LOW (ref 3.5–5.1)
SODIUM: 131 mmol/L — AB (ref 135–145)

## 2016-09-27 LAB — GLUCOSE, CAPILLARY
GLUCOSE-CAPILLARY: 135 mg/dL — AB (ref 65–99)
GLUCOSE-CAPILLARY: 115 mg/dL — AB (ref 65–99)
GLUCOSE-CAPILLARY: 122 mg/dL — AB (ref 65–99)
Glucose-Capillary: 134 mg/dL — ABNORMAL HIGH (ref 65–99)
Glucose-Capillary: 135 mg/dL — ABNORMAL HIGH (ref 65–99)
Glucose-Capillary: 147 mg/dL — ABNORMAL HIGH (ref 65–99)

## 2016-09-27 LAB — MAGNESIUM: Magnesium: 1.9 mg/dL (ref 1.7–2.4)

## 2016-09-27 LAB — PHOSPHORUS: Phosphorus: 3.7 mg/dL (ref 2.5–4.6)

## 2016-09-27 LAB — TROPONIN I
TROPONIN I: 1.27 ng/mL — AB (ref <0.03)
TROPONIN I: 1.12 ng/mL — AB (ref <0.03)

## 2016-09-27 LAB — PROCALCITONIN: Procalcitonin: 25.5 ng/mL

## 2016-09-27 MED ORDER — POTASSIUM CHLORIDE CRYS ER 20 MEQ PO TBCR
40.0000 meq | EXTENDED_RELEASE_TABLET | Freq: Once | ORAL | Status: AC
  Administered 2016-09-27: 40 meq via ORAL
  Filled 2016-09-27 (×2): qty 2

## 2016-09-27 MED ORDER — OMEGA-3-ACID ETHYL ESTERS 1 G PO CAPS
1.0000 g | ORAL_CAPSULE | Freq: Two times a day (BID) | ORAL | Status: DC
  Administered 2016-09-27 – 2016-09-30 (×6): 1 g via ORAL
  Filled 2016-09-27 (×6): qty 1

## 2016-09-27 MED ORDER — HYDROCODONE-ACETAMINOPHEN 5-325 MG PO TABS
1.0000 | ORAL_TABLET | Freq: Four times a day (QID) | ORAL | Status: DC | PRN
  Administered 2016-09-30: 1 via ORAL
  Filled 2016-09-27: qty 1

## 2016-09-27 NOTE — Progress Notes (Signed)
[facility] Critical Care Medicine Progess Note    SYNOPSIS   66 y o with ESRD, PVD, gangrenous foot ulcers, s/p septic shock.   ASSESSMENT/PLAN   Severe Septic Shock with hypotension likely secondary to bilateral lower extremity wounds-- Doing better, has been weaned off pressors.  Anemia without acute blood loss ESRD on hemodialysis  Mildly elevated troponin likely secondary to demand ischemia Atrial Fibrillation-rate controlled  P:  Continue abx ID, Vascular, Nephrology, General Surgery, and Podiatry consulted appreciate input  Trend troponin Continuous telemetry monitoring  Continue D10W  ml/hr will d/c SSI  CBG's q2hrs Prn norco for pain management  Subq heparin for VTE prophylaxis    Micro/culture results: MRSA PCR positive.  BCx2 NTD UC -- Sputum--  Antibiotics: Zosyn 9/19>>  MAJOR EVENTS/TEST RESULTS:   Best Practices  DVT Prophylaxis: heparin GI Prophylaxis: --   ---------------------------------------   ----------------------------------------   Name: Travis [name] MRN: [id number] DOB: November 21, 1950    ADMISSION DATE:  10/22/2016   SUBJECTIVE:   No new complaints today, pt wants to go home.    Review of Systems:  Constitutional: Feels well. Cardiovascular: No chest pain.  Pulmonary: Denies dyspnea.   10 point review of systems was negative  other than what is documented in the HPI.    VITAL SIGNS: Temp:  [98 F (36.7 C)-99.3 F (37.4 C)] 98.6 F (37 C) (09/22 0700) Pulse Rate:  [64-110] 84 (09/22 0800) Resp:  [17-38] 22 (09/22 0800) BP: (69-113)/(47-74) 83/51 (09/22 0800) SpO2:  [89 %-100 %] 100 % (09/22 0611) Weight:  [182 lb 15.7 oz (83 kg)] 182 lb 15.7 oz (83 kg) (09/22 0500)     PHYSICAL EXAMINATION: Physical Examination:   VS: BP (!) 83/51   Pulse 84   Temp 98.6 F (37 C)   Resp (!) 22   Ht  (1.676 m)   Wt 182 lb 15.7 oz (83 kg)   SpO2 100%   BMI 29.53 kg/m   General Appearance: No distress    Neuro:without focal findings, mental status normal. HEENT: PERRLA, EOM intact. Pulmonary: normal breath sounds   CardiovascularNormal S1,S2.  No m/r/g.   Abdomen: Benign, Soft, non-tender. Renal:  No costovertebral tenderness  GU:  Not performed at this time. Endocrine: No evident thyromegaly. Skin:   warm, no rashes, no ecchymosis  Extremities: --    LABORATORY PANEL:   CBC  Recent Labs Lab 09/27/16 0729  WBC 11.3*  HGB 9.3*  HCT 27.3*  PLT 138*    Chemistries   Recent Labs Lab 2016/10/22 1221 09/25/16 0338  09/27/16 0729  NA 133* 131*  < > 131*  K 2.6* 3.5  < > 3.3*  CL 98* 97*  < > 98*  CO2 27 24  < > 24  GLUCOSE 37* 61*  < > 128*  BUN 15 22*  < > 23*  CREATININE 3.00* 3.87*  < > 3.51*  CALCIUM 8.5* 8.0*  < > 8.5*  MG 1.8  --   --  1.9  PHOS  --   --   --  3.7  AST 45*  --   --   --   ALT 20  --   --   --   ALKPHOS 124  --   --   --   BILITOT 1.9* 2.5*  --   --   < > = values in this interval not displayed.   Recent Labs Lab 09/26/16 1137 09/26/16 1522 09/26/16 1957 09/26/16 2355 09/27/16 7829  09/27/16 0811  GLUCAP 94 95 117* 134* 135* 115*   No results for input(s): PHART, PCO2ART, PO2ART in the last 168 hours.  Recent Labs Lab 09/30/2016 1221 09/25/16 0338  AST 45*  --   ALT 20  --   ALKPHOS 124  --   BILITOT 1.9* 2.5*  ALBUMIN 2.3*  --     Cardiac Enzymes  Recent Labs Lab 09/27/16 0729  TROPONINI 1.27*    RADIOLOGY:  Dg Abd 1 View  Result Date: 09/26/2016 CLINICAL DATA:  Vomiting.  Sepsis. EXAM: ABDOMEN - 1 VIEW COMPARISON:  Ultrasound of the abdomen 09/25/2016. FINDINGS: The bowel gas pattern is normal. No radio-opaque calculi are seen. Prior median sternotomy. Ovoid calcification LEFT upper quadrant appears to represent a splenic cyst. Vascular calcification. IMPRESSION: Negative exam. Electronically Signed   By: Elsie Stain M.D.   On: 09/26/2016 15:03        --Wells Guiles, MD.  ICU Pager: [phone] [place]  Pulmonary and Critical Care Office Number: [phone]   09/27/2016

## 2016-09-27 NOTE — Progress Notes (Addendum)
[employer] - [place] at [facility]   PATIENT NAME: Travis [name]    MR#:  [id number]  DATE OF BIRTH:  1950/12/09  SUBJECTIVE:   Patient Denies nausea or vomiting. Stating he feels better this morning.  REVIEW OF SYSTEMS:    Review of Systems  Constitutional: Positive for generalized weakness  HENT: Negative for ear pain, nosebleeds, congestion, facial swelling, rhinorrhea, neck pain, neck stiffness and ear discharge.   Respiratory: Negative for cough, shortness of breath, wheezing  Cardiovascular: Negative for chest pain, palpitations and leg swelling.  Gastrointestinal: Negative for heartburn, abdominal pain, ositos nausea and vomiting no diarrhea or constipation Genitourinary: Negative for dysuria, urgency, frequency, hematuria Musculoskeletal: Negative for back pain or joint pain Neurological: Negative for dizziness, seizures, syncope, focal weakness,  numbness and headaches.  Hematological: Does  bruise/bleed easily.  Psychiatric/Behavioral: Negative for hallucinations, confusion, dysphoric mood SKIN: b/l wound infections   Tolerating Diet:  yes    DRUG ALLERGIES:  No Known Allergies  VITALS:  Blood pressure (!) 83/51, pulse 84, temperature 98.6 F (37 C), resp. rate (!) 22, height  (1.676 m), weight 83 kg (182 lb 15.7 oz), SpO2 100 %.  PHYSICAL EXAMINATION:  Constitutional: Appears ill appearing HENT: Normocephalic. Marland Kitchen Oropharynx is clear and moist.  Eyes: Conjunctivae and EOM are normal. PERRLA, no scleral icterus.  Neck: Normal ROM. Neck supple. No JVD. No tracheal deviation. CVS: Irregular, irregular S1/S2 +, no murmurs, no gallops, no carotid bruit.  Pulmonary: Effort and breath sounds normal, no stridor, rhonchi, wheezes, rales.  Abdominal: Soft. BS +,  no distension, tenderness, rebound or guarding.  Musculoskeletal: Moves all extremities Neuro: Alert. CN 2-12 grossly intact. No focal deficits. Skin: b/l LE ulcerationsCovered   Psychiatric: Depressed mood and affect.      LABORATORY PANEL:   CBC  Recent Labs Lab 09/27/16 0729  WBC 11.3*  HGB 9.3*  HCT 27.3*  PLT 138*   ------------------------------------------------------------------------------------------------------------------  Chemistries   Recent Labs Lab 09/17/2016 1221 09/25/16 0338  09/27/16 0729  NA 133* 131*  < > 131*  K 2.6* 3.5  < > 3.3*  CL 98* 97*  < > 98*  CO2 27 24  < > 24  GLUCOSE 37* 61*  < > 128*  BUN 15 22*  < > 23*  CREATININE 3.00* 3.87*  < > 3.51*  CALCIUM 8.5* 8.0*  < > 8.5*  MG 1.8  --   --  1.9  AST 45*  --   --   --   ALT 20  --   --   --   ALKPHOS 124  --   --   --   BILITOT 1.9* 2.5*  --   --   < > = values in this interval not displayed. ------------------------------------------------------------------------------------------------------------------  Cardiac Enzymes  Recent Labs Lab 09/26/16 0208 09/26/16 2135 09/27/16 0729  TROPONINI 1.38* 1.40* 1.27*   ------------------------------------------------------------------------------------------------------------------  RADIOLOGY:  Dg Abd 1 View  Result Date: 09/26/2016 CLINICAL DATA:  Vomiting.  Sepsis. EXAM: ABDOMEN - 1 VIEW COMPARISON:  Ultrasound of the abdomen 09/25/2016. FINDINGS: The bowel gas pattern is normal. No radio-opaque calculi are seen. Prior median sternotomy. Ovoid calcification LEFT upper quadrant appears to represent a splenic cyst. Vascular calcification. IMPRESSION: Negative exam. Electronically Signed   By: Elsie Stain M.D.   On: 09/26/2016 15:03     ASSESSMENT AND PLAN:   66 y.o. male CAD s/p CABG, hx of STEMI, T2DM, ESRD on HD, PVD w/ a hx of  bilateral toe and transmetatarsal amputation and chronic non-healing LE wounds. He is followed in [facility] wound clinic complicated by medical non-compliance presented to [facility] ED upon referral from hemodialysis for hypoglycemia and B/L lower extremity wounds.   1. Septic shock with  hypotension, tachycardia and fevers from bilateral LE wounds Septic shock has resolved. He has low blood pressure however MAP is greater than 65. Continue broad-spectrum antibiotics Blood cultures remain negative.  2. Bilateral lower extremity wounds: Patient is followed at [facility] wound clinic. He has extensive eschar on all wounds. Patient has been seen and evaluated by surgery as well as podiatry and vascular surgery.  Patient is high risk for above-knee amputations and does not desire above-knee amputations. As per vascular surgery no role for debridement for local procedures for angiogram at this point. If at any point the patient desires to have amputation and vascular surgery can perform it here or patient may follow-up with his vascular surgeon at [facility] and wound care physicians at [facility].   Management as per wound care and attention as follows To posterior right lower leg and anterior and posterior left lower leg wounds, cleanse wounds with NS, pat dry, apply large Xeroform, cover  wounds with ABDs, wrap with kerlix, adhere kerlix to kerlix, no tape on skin, perform daily and prn soiling. Prevalon boots to prevent foot ulcers. Foam dressing to sacral ulcer, change Q3D and prn soiling. Peel back and assess Q shift.  Continue vancomycin and Zosyn ID consultation not available this weekend. Wound culture pending  3. Hypoglycemia: Currently on  D10 drip  4. ESRD on hemodialysis: He will continue dialysis as scheduled by nephrology. Could consider adding Midorine for low BP prior to HD days  5. Chronic diastolic heart failure with preserved ejection fraction/chronic ischemic heart disease with history of CABG: Continue to monitor  No signs of acute exacerbation at this time.  6. Hypokalemia: This will be repleted  7. Hyponatremia: Relatively stable 8. Anemia of chronic disease:  Will need Epogen during dialysis Hemoglobin range stable 9. Elevated bilirubin: Abdominal ultrasound without  evidence of acute abdominal pathology.  10. Peripheral vascular disease: Continue aspirin and Plavix  11. Essential hypertension due to severe sepsis with hypotension blood pressure medications discontinued for now  12. Chronic atrial fibrillation:CHADS2VASC score 4 As per his cardiologist he is not a candidate for anticoagulation given his renal disease and noncompliance with increased risk of high bleeding and falls   13. Elevated troponin in the setting of septic shock and poor renal clearance: Discussed with intensivist yesterday. He did not feel cardiology consultation was needed.  14. Nausea and vomiting: Right upper quadrant ultrasound shows sludge and KUB shows no acute abnormality This has resolved  15. Depression: Patient was evaluated by psychiatry. Remeron 7.5 mg added to his regimen, more for appetite and depression.  16. Moderate malnutrition: Continue nutritional supplements and omega-3 for wound healing.   Patient has very poor overall prognosis. Awaiting palliative care consultation. Management plans discussed with the patient and he is in agreement.  CODE STATUS: FULL   TOTAL TIME TAKING CARE OF THIS PATIENT: 24 minutes.   POSSIBLE D/C ??, DEPENDING ON CLINICAL CONDITION.   Whitten Andreoni M.D on 09/27/2016 at 11:12 AM  Between 7am to 6pm - Pager - [phone] After 6pm go to [email or web address] - Social research officer, government  [facility]  Office  [phone]  CC: Primary care physician; Center, [facility]  Note: This dictation was prepared with Nurse, children's dictation along with  smaller phrase technology. Any transcriptional errors that result from this process are unintentional. 

## 2016-09-27 NOTE — Progress Notes (Signed)
[facility]  ROUNDING NOTE   Subjective:  Patient states that he feels better this a.m. Not having significant pain in his lower extremities at the moment. He did complete dialysis yesterday.  he may be transitioned out of the critical care unit today.  Objective:  Vital signs in last 24 hours:  Temp:  [98 F (36.7 C)-99.3 F (37.4 C)] 98.4 F (36.9 C) (09/22 0520) Pulse Rate:  [64-110] 88 (09/22 0611) Resp:  [14-38] 23 (09/22 0611) BP: (70-113)/(50-74) 88/50 (09/22 0611) SpO2:  [89 %-100 %] 100 % (09/22 0611) Weight:  [83 kg (182 lb 15.7 oz)] 83 kg (182 lb 15.7 oz) (09/22 0500)  Weight change: 0.4 kg (14.1 oz) Filed Weights   09/25/16 1410 09/26/16 0458 09/27/16 0500  Weight: 82.6 kg (182 lb 1.6 oz) 81.2 kg (179 lb 0.2 oz) 83 kg (182 lb 15.7 oz)    Intake/Output: I/O last 3 completed shifts: In: 1480 [I.V.:1480] Out: 500 [Other:500]   Intake/Output this shift:  No intake/output data recorded.  Physical Exam: General: No acute distress  Head: Normocephalic, atraumatic. Moist oral mucosal membranes  Eyes: Anicteric  Neck: Supple, trachea midline  Lungs:  Clear to auscultation, normal effort  Heart: S1S2 no rubs  Abdomen:  Soft, nontender, bowel sounds present  Extremities: Bilateral lower extremeties wrapped, 1+ b/l LE edema.  Neurologic: Awake, alert, following commands  Skin: No rashes  Access: LUE AV access    Basic Metabolic Panel:  Recent Labs Lab 09/16/2016 1221 09/25/16 0338 09/26/16 0208  NA 133* 131* 129*  K 2.6* 3.5 3.5  CL 98* 97* 94*  CO2 GLUCOSE 37* 61* 133*  BUN 15 22* 31*  CREATININE 3.00* 3.87* 4.67*  CALCIUM 8.5* 8.0* 8.5*  MG 1.8  --   --     Liver Function Tests:  Recent Labs Lab 09/11/2016 1221 09/25/16 0338  AST 45*  --   ALT 20  --   ALKPHOS 124  --   BILITOT 1.9* 2.5*  PROT 7.2  --   ALBUMIN 2.3*  --    No results for input(s): LIPASE, AMYLASE in the last 168 hours. No results for input(s):  AMMONIA in the last 168 hours.  CBC:  Recent Labs Lab 09/19/2016 1221 09/25/16 0338 09/26/16 0208  WBC 6.6 9.5 12.6*  NEUTROABS 4.7  --   --   HGB 10.0* 9.5* 9.5*  HCT 28.9* 27.3* 28.6*  MCV 100.2* 101.3* 100.3*  PLT 189 151 168    Cardiac Enzymes:  Recent Labs Lab 09/13/2016 1221 09/25/16 1438 09/25/16 2138 09/26/16 0208 09/26/16 2135  TROPONINI 0.11* 1.43* 1.27* 1.38* 1.40*    BNP: Invalid input(s): POCBNP  CBG:  Recent Labs Lab 09/26/16 1137 09/26/16 1522 09/26/16 1957 09/26/16 2355 09/27/16 0357  GLUCAP 94 95 117* 134* 135*    Microbiology: Results for orders placed or performed during the hospital encounter of 10/02/2016  Blood Culture (routine x 2)     Status: None (Preliminary result)   Collection Time: 09/22/2016 12:21 PM  Result Value Ref Range Status   Specimen Description BLOOD RIGHT AC  Final   Special Requests   Final    BOTTLES DRAWN AEROBIC AND ANAEROBIC Blood Culture adequate volume   Culture NO GROWTH 3 DAYS  Final   Report Status PENDING  Incomplete  Blood Culture (routine x 2)     Status: None (Preliminary result)   Collection Time: 10/01/2016 12:21 PM  Result Value Ref Range Status  Specimen Description BLOOD BLOOD RIGHT HAND  Final   Special Requests   Final    BOTTLES DRAWN AEROBIC AND ANAEROBIC Blood Culture results may not be optimal due to an inadequate volume of blood received in culture bottles   Culture NO GROWTH 3 DAYS  Final   Report Status PENDING  Incomplete  Surgical PCR screen     Status: Abnormal   Collection Time: 09/16/2016  6:31 PM  Result Value Ref Range Status   MRSA, PCR POSITIVE (A) NEGATIVE Final    Comment: RESULT CALLED TO, READ BACK BY AND VERIFIED WITH: CRYSTAL BASS AT 2041 09/08/2016 BY TFK    Staphylococcus aureus POSITIVE (A) NEGATIVE Final    Comment: (NOTE) The Xpert SA Assay (FDA approved for NASAL specimens in patients 72 years of age and older), is one component of a comprehensive surveillance  program. It is not intended to diagnose infection nor to guide or monitor treatment.   Aerobic Culture (superficial specimen)     Status: None (Preliminary result)   Collection Time: 09/25/16  2:30 PM  Result Value Ref Range Status   Specimen Description FOOT  Final   Special Requests Normal  Final   Gram Stain   Final    RARE WBC PRESENT,BOTH PMN AND MONONUCLEAR RARE GRAM POSITIVE COCCI RARE GRAM NEGATIVE RODS    Culture   Final    CULTURE REINCUBATED FOR BETTER GROWTH Performed at [facility], [address] [place], [place] [address]    Report Status PENDING  Incomplete    Coagulation Studies: No results for input(s): LABPROT, INR in the last 72 hours.  Urinalysis: No results for input(s): COLORURINE, LABSPEC, PHURINE, GLUCOSEU, HGBUR, BILIRUBINUR, KETONESUR, PROTEINUR, UROBILINOGEN, NITRITE, LEUKOCYTESUR in the last 72 hours.  Invalid input(s): APPERANCEUR    Imaging: Dg Abd 1 View  Result Date: 09/26/2016 CLINICAL DATA:  Vomiting.  Sepsis. EXAM: ABDOMEN - 1 VIEW COMPARISON:  Ultrasound of the abdomen 09/25/2016. FINDINGS: The bowel gas pattern is normal. No radio-opaque calculi are seen. Prior median sternotomy. Ovoid calcification LEFT upper quadrant appears to represent a splenic cyst. Vascular calcification. IMPRESSION: Negative exam. Electronically Signed   By: Elsie Stain M.D.   On: 09/26/2016 15:03   US Abdomen Limited Ruq  Result Date: 09/25/2016 CLINICAL DATA:  Elevated bilirubin, vomiting EXAM: ULTRASOUND ABDOMEN LIMITED RIGHT UPPER QUADRANT COMPARISON:  None. FINDINGS: Gallbladder: Gallbladder is distended. Sludge noted within the gallbladder. No gallbladder wall thickening or sonographic Murphy sign. Common bile duct: Diameter: Normal caliber, 3 mm Liver: Nodular contours of the liver compatible with cirrhosis. No focal hepatic abnormality. No intrahepatic biliary duct dilatation. Portal vein is patent on color Doppler imaging with normal direction of  blood flow towards the liver. IMPRESSION: Mild gallbladder distention with sludge noted. No visible stones, wall thickening or sonographic Murphy's sign. Changes of cirrhosis.  No focal hepatic abnormality. Electronically Signed   By: Charlett Nose M.D.   On: 09/25/2016 10:00     Medications:   . sodium chloride    . dextrose 40 mL/hr at 09/27/16 0612  . phenylephrine (NEO-SYNEPHRINE) Adult infusion Stopped (09/26/16 1529)  . piperacillin-tazobactam (ZOSYN)  IV Stopped (09/27/16 0206)  . [START ON 09/29/2016] vancomycin     . aspirin EC  81 mg Oral Daily  . Chlorhexidine Gluconate Cloth  6 each Topical Q0600  . epoetin (EPOGEN/PROCRIT) injection  10,000 Units Intravenous Q M,W,F-HD  . feeding supplement (ENSURE ENLIVE)  237 mL Oral BID BM  . heparin  5,000 Units Subcutaneous Q8H  . mirtazapine  7.5 mg Oral QHS  . multivitamin  1 tablet Oral QHS  . mupirocin ointment  1 application Nasal BID  . sodium chloride flush  3 mL Intravenous Q12H  . [START ON 09/30/2016] Vitamin D (Ergocalciferol)  50,000 Units Oral Q7 days   sodium chloride, acetaminophen **OR** acetaminophen, albuterol, HYDROcodone-acetaminophen, ondansetron **OR** ondansetron (ZOFRAN) IV, senna-docusate, sodium chloride flush  Assessment/ Plan:  66 y.o. male with diabetes, hypertension, coronary artery disease s/p CABG 2011, anemia of CKD, SHPTH, who presents now with b/l LE ulcerations  MWF [facility] [address]   1. End Stage Renal Disease: Patient did complete hemodialysis yesterday. No acute indication for dialysis today. We will plan for dialysis again on Monday.  2. Hypotension. Currently off pressors. We may need to consider adding midodrine to his medication regimen.  3. Anemia of chronic kidney disease. Continue Epogen with dialysis.  4. Secondary Hyperparathyroidism:  Phosphorus not drawn yesterday. We will recheck this on Monday with dialysis.  LOS: 3 Travis [name] 9/22/20187:32 AM

## 2016-09-27 NOTE — Progress Notes (Signed)
Pt was agitated this am, stated he had not slept, and was worried about his wheelchair left he left at dialysis.  With help of interpreter we called friend and she stated she will go and pick it up. Pt refused to have wounds dressed this am, despite multiple attempts, stated they had just been done. Interpreter in room, discussed need to dress them again, pt refused, co upset stomach, Already offered pt nausea and pain meds and pt refused. With the help of interpreter pt agreed to IV nausea meds. Given with good effect and pt then slept for 3 hours. Pt has not eaten much, requested fruit for breakfast , given peaches and melon, ate about 4 bites. D10W  infusing BG stable. No family in to visit. Reamins in A fib, rate controlled, BP with MAP of 60, pt is anuric

## 2016-09-27 NOTE — Consult Note (Signed)
[facility] Face-to-Face Psychiatry Consult   Reason for Consult:  Consult for 66 year old man with severe medical problems. Concern about depression Referring Physician:  Mody Patient Identification: Travis [name] MRN:  [id number] Principal Diagnosis: Depression Diagnosis:   Patient Active Problem List   Diagnosis Date Noted  . 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 Chronic ulcer of right lower extremity with necrosis of muscle ([place]) [L97.913]   . PAD (peripheral artery disease) ([place]) [I73.9]   . Pressure injury of skin [L89.90] 09/26/2016  . Depression [F32.9] 09/26/2016  . Anorexia [R63.0] 09/26/2016  . Subacute delirium [F05] 09/26/2016  . Cellulitis of leg [U13.244] 09/25/2016  . Unstable angina (HCC) [I20.0] 02/21/2015    Total Time spent with patient: 15 minutes  Subjective:   Travis [name] is a 66 y.o. male patient admitted with "I do not want to live with this condition".  Patient was seen once again today with the assistance of a Spanish language interpreter. Once again the patient is fairly sleepy and withdrawn.On direct questioning he says that he does not want to give up and he does want to live however he seems to have little emotional motivation for it. Has still not been eating or drinking well with the excuse that he will just end up throwing it up. Denies any psychotic symptoms. Has not been behaving in a delusional manner. Affect a little bit blunted but not tearful. Reviewing the chart it looks like he is most likely not going to have surgery on his legs at this point. Patient remains high risk with his chronic medical problems. Tolerated medicine well last night without difficulty.  HPI:  Patient interviewed chart reviewed. Hospital provided Spanish language interpreter assisted. On duty critical care nurse also assisted. 66 year old man with multiple severe chronic medical problems. End-stage renal disease, cellulitis of the legs  diabetes on dialysis possibility of amputation. Now hypotensive is well requiring pressors. Patient is reported not to have been eating or drinking in about 2 days. Hardly interacting. On interview today the patient was only intermittently cooperative. He would fall asleep easily. Sometimes he would answer questions lucidly but often would then trail off into long answers full of vague spiritual comments. [name]. He says that if he cannot do things with his legs anymore he would rather not live. He tells me however that the reason he is not eating or drinking is that he is afraid he will throw up which she did last time. Answers are not entirely coherent. He also talks about not wanting to be a burden to his family or to bother the nurses with having them try to feed him. Patient seems to indicate that he has reconciled himself to dying. Does not indicate hallucinations. Doesn't seem to be psychotic. Attention however waxes and wanes in and out.  Social history: I am told that the patient is married to a much younger woman and has a young child at home. He makes positive comments about his family but indicates that he does not want to be a burden.  Medical history: Patient has diabetes and end-stage renal disease heart disease he is on dialysis he is here for continued surgical treatment of wounds on the legs with the possibility of amputation.  Substance abuse history: Nothing in the chart about it and the patient denies having a significant substance abuse problem  Past Psychiatric History: No indication  of any past psychiatric history. Patient denies any past psychiatric history. Nothing to indicate past treatment of depression. No history of suicide attempts or violence.  Risk to Self: Is patient at risk for suicide?: No Risk to Others:   Prior Inpatient Therapy:   Prior Outpatient Therapy:    Past Medical History:  Past Medical  History:  Diagnosis Date  . CHF (congestive heart failure) ([place])   . Chronic kidney disease   . Coronary artery disease   . Diabetes mellitus without complication ([place])   . Dialysis patient ([place])   . Osteomyelitis [facility])     Past Surgical History:  Procedure Laterality Date  . A/V FISTULAGRAM Left 03/03/2016   Procedure: A/V Fistulagram;  Surgeon: Algernon Huxley, MD;  Location: [place] [facility];  Service: Cardiovascular;  Laterality: Left;  . A/V SHUNT INTERVENTION N/A 03/03/2016   Procedure: A/V Shunt Intervention;  Surgeon: Algernon Huxley, MD;  Location: [place] [facility];  Service: Cardiovascular;  Laterality: N/A;  . ARTERIAL THROMBECTOMY    . CARDIAC SURGERY    . FOOT SURGERY Right   . VASCULAR SURGERY     Family History: No family history on file. Family Psychiatric  History: Unknown patient does not know of any Social History:  History  Alcohol Use No     History  Drug Use No    Social History   Social History  . Marital status: Married    Spouse name: N/A  . Number of children: N/A  . Years of education: N/A   Social History Main Topics  . Smoking status: Former Research scientist (life sciences)  . Smokeless tobacco: Never Used  . Alcohol use No  . Drug use: No  . Sexual activity: Not Asked   Other Topics Concern  . None   Social History Narrative  . None   Additional Social History:    Allergies:  No Known Allergies  Labs:  Results for orders placed or performed during the hospital encounter of 10/05/2016 (from the past 48 hour(s))  Glucose, capillary     Status: Abnormal   Collection Time: 09/25/16  5:11 PM  Result Value Ref Range   Glucose-Capillary 105 (H) 65 - 99 mg/dL  Glucose, capillary     Status: Abnormal   Collection Time: 09/25/16  6:17 PM  Result Value Ref Range   Glucose-Capillary 106 (H) 65 - 99 mg/dL  Glucose, capillary     Status: Abnormal   Collection Time: 09/25/16  7:26 PM  Result Value Ref Range   Glucose-Capillary 107 (H) 65 - 99 mg/dL  Troponin I (q  6hr x 3)     Status: Abnormal   Collection Time: 09/25/16  9:38 PM  Result Value Ref Range   Troponin I 1.27 (HH) <0.03 ng/mL    Comment: CRITICAL VALUE NOTED. VALUE IS CONSISTENT WITH PREVIOUSLY REPORTED/CALLED VALUE ALV  Glucose, capillary     Status: Abnormal   Collection Time: 09/25/16 10:02 PM  Result Value Ref Range   Glucose-Capillary 127 (H) 65 - 99 mg/dL  Glucose, capillary     Status: Abnormal   Collection Time: 09/25/16 11:57 PM  Result Value Ref Range   Glucose-Capillary 123 (H) 65 - 99 mg/dL  Troponin I (q 6hr x 3)     Status: Abnormal   Collection Time: 09/26/16  2:08 AM  Result Value Ref Range   Troponin I 1.38 (HH) <0.03 ng/mL    Comment: CRITICAL VALUE NOTED. VALUE IS CONSISTENT WITH PREVIOUSLY REPORTED/CALLED VALUE ALV  CBC     Status: Abnormal   Collection Time: 09/26/16  2:08 AM  Result Value Ref Range   WBC 12.6 (H) 3.8 - 10.6 K/uL   RBC 2.85 (L) 4.40 - 5.90 MIL/uL   Hemoglobin 9.5 (L) 13.0 - 18.0 g/dL   HCT 38.1 (L) 85.3 - 71.4 %   MCV 100.3 (H) 80.0 - 100.0 fL   MCH 33.4 26.0 - 34.0 pg   MCHC 33.3 32.0 - 36.0 g/dL   RDW 68.9 (H) 72.8 - 18.7 %   Platelets 168 150 - 440 K/uL  Basic metabolic panel     Status: Abnormal   Collection Time: 09/26/16  2:08 AM  Result Value Ref Range   Sodium 129 (L) 135 - 145 mmol/L   Potassium 3.5 3.5 - 5.1 mmol/L   Chloride 94 (L) 101 - 111 mmol/L   CO2 23 22 - 32 mmol/L   Glucose, Bld 133 (H) 65 - 99 mg/dL   BUN 31 (H) 6 - 20 mg/dL   Creatinine, Ser 9.76 (H) 0.61 - 1.24 mg/dL   Calcium 8.5 (L) 8.9 - 10.3 mg/dL   GFR calc non Af Amer 12 (L) >60 mL/min   GFR calc Af Amer 14 (L) >60 mL/min    Comment: (NOTE) The eGFR has been calculated using the CKD EPI equation. This calculation has not been validated in all clinical situations. eGFR's persistently <60 mL/min signify possible Chronic Kidney Disease.    Anion gap 12 5 - 15  Procalcitonin     Status: None   Collection Time: 09/26/16  2:08 AM  Result Value Ref Range     Procalcitonin 28.05 ng/mL    Comment:        Interpretation: PCT >= 10 ng/mL: Important systemic inflammatory response, almost exclusively due to severe bacterial sepsis or septic shock. (NOTE)         ICU PCT Algorithm               Non ICU PCT Algorithm    ----------------------------     ------------------------------         PCT < 0.25 ng/mL                 PCT < 0.1 ng/mL     Stopping of antibiotics            Stopping of antibiotics       strongly encouraged.               strongly encouraged.    ----------------------------     ------------------------------       PCT level decrease by               PCT < 0.25 ng/mL       >= 80% from peak PCT       OR PCT 0.25 - 0.5 ng/mL          Stopping of antibiotics                                             encouraged.     Stopping of antibiotics           encouraged.    ----------------------------     ------------------------------       PCT level decrease by              PCT >=  0.25 ng/mL       < 80% from peak PCT        AND PCT >= 0.5 ng/mL             Continuing antibiotics                                              encouraged.       Continuing antibiotics            encouraged.    ----------------------------     ------------------------------     PCT level increase compared          PCT > 0.5 ng/mL         with peak PCT AND          PCT >= 0.5 ng/mL             Escalation of antibiotics                                          strongly encouraged.      Escalation of antibiotics        strongly encouraged.   Glucose, capillary     Status: Abnormal   Collection Time: 09/26/16  2:21 AM  Result Value Ref Range   Glucose-Capillary 125 (H) 65 - 99 mg/dL  Glucose, capillary     Status: Abnormal   Collection Time: 09/26/16  4:48 AM  Result Value Ref Range   Glucose-Capillary 116 (H) 65 - 99 mg/dL  Glucose, capillary     Status: Abnormal   Collection Time: 09/26/16  6:44 AM  Result Value Ref Range   Glucose-Capillary  116 (H) 65 - 99 mg/dL  Glucose, capillary     Status: Abnormal   Collection Time: 09/26/16  7:52 AM  Result Value Ref Range   Glucose-Capillary 121 (H) 65 - 99 mg/dL  Glucose, capillary     Status: Abnormal   Collection Time: 09/26/16  9:59 AM  Result Value Ref Range   Glucose-Capillary 109 (H) 65 - 99 mg/dL  Glucose, capillary     Status: None   Collection Time: 09/26/16 11:37 AM  Result Value Ref Range   Glucose-Capillary 94 65 - 99 mg/dL  Glucose, capillary     Status: None   Collection Time: 09/26/16  3:22 PM  Result Value Ref Range   Glucose-Capillary 95 65 - 99 mg/dL  Glucose, capillary     Status: Abnormal   Collection Time: 09/26/16  7:57 PM  Result Value Ref Range   Glucose-Capillary 117 (H) 65 - 99 mg/dL  Troponin I (q 6hr x 3)     Status: Abnormal   Collection Time: 09/26/16  9:35 PM  Result Value Ref Range   Troponin I 1.40 (HH) <0.03 ng/mL    Comment: CRITICAL VALUE NOTED. VALUE IS CONSISTENT WITH PREVIOUSLY REPORTED/CALLED VALUE.PMH  Glucose, capillary     Status: Abnormal   Collection Time: 09/26/16 11:55 PM  Result Value Ref Range   Glucose-Capillary 134 (H) 65 - 99 mg/dL   Comment 1 Notify RN    Comment 2 Document in Chart   Glucose, capillary     Status: Abnormal   Collection Time: 09/27/16  3:57 AM  Result Value Ref Range   Glucose-Capillary  135 (H) 65 - 99 mg/dL   Comment 1 Notify RN    Comment 2 Document in Chart   Procalcitonin     Status: None   Collection Time: 09/27/16  7:29 AM  Result Value Ref Range   Procalcitonin 25.50 ng/mL    Comment:        Interpretation: PCT >= 10 ng/mL: Important systemic inflammatory response, almost exclusively due to severe bacterial sepsis or septic shock. (NOTE)         ICU PCT Algorithm               Non ICU PCT Algorithm    ----------------------------     ------------------------------         PCT < 0.25 ng/mL                 PCT < 0.1 ng/mL     Stopping of antibiotics            Stopping of antibiotics        strongly encouraged.               strongly encouraged.    ----------------------------     ------------------------------       PCT level decrease by               PCT < 0.25 ng/mL       >= 80% from peak PCT       OR PCT 0.25 - 0.5 ng/mL          Stopping of antibiotics                                             encouraged.     Stopping of antibiotics           encouraged.    ----------------------------     ------------------------------       PCT level decrease by              PCT >= 0.25 ng/mL       < 80% from peak PCT        AND PCT >= 0.5 ng/mL             Continuing antibiotics                                              encouraged.       Continuing antibiotics            encouraged.    ----------------------------     ------------------------------     PCT level increase compared          PCT > 0.5 ng/mL         with peak PCT AND          PCT >= 0.5 ng/mL             Escalation of antibiotics                                          strongly encouraged.      Escalation of antibiotics        strongly encouraged.  Basic metabolic panel     Status: Abnormal   Collection Time: 09/27/16  7:29 AM  Result Value Ref Range   Sodium 131 (L) 135 - 145 mmol/L   Potassium 3.3 (L) 3.5 - 5.1 mmol/L   Chloride 98 (L) 101 - 111 mmol/L   CO2 24 22 - 32 mmol/L   Glucose, Bld 128 (H) 65 - 99 mg/dL   BUN 23 (H) 6 - 20 mg/dL   Creatinine, Ser 3.51 (H) 0.61 - 1.24 mg/dL   Calcium 8.5 (L) 8.9 - 10.3 mg/dL   GFR calc non Af Amer 17 (L) >60 mL/min   GFR calc Af Amer 19 (L) >60 mL/min    Comment: (NOTE) The eGFR has been calculated using the CKD EPI equation. This calculation has not been validated in all clinical situations. eGFR's persistently <60 mL/min signify possible Chronic Kidney Disease.    Anion gap 9 5 - 15  CBC     Status: Abnormal   Collection Time: 09/27/16  7:29 AM  Result Value Ref Range   WBC 11.3 (H) 3.8 - 10.6 K/uL   RBC 2.67 (L) 4.40 - 5.90 MIL/uL   Hemoglobin  9.3 (L) 13.0 - 18.0 g/dL   HCT 27.3 (L) 40.0 - 52.0 %   MCV 102.0 (H) 80.0 - 100.0 fL   MCH 34.8 (H) 26.0 - 34.0 pg   MCHC 34.1 32.0 - 36.0 g/dL   RDW 16.5 (H) 11.5 - 14.5 %   Platelets 138 (L) 150 - 440 K/uL  Troponin I (q 6hr x 3)     Status: Abnormal   Collection Time: 09/27/16  7:29 AM  Result Value Ref Range   Troponin I 1.27 (HH) <0.03 ng/mL    Comment: CRITICAL VALUE NOTED.  VALUE IS CONSISTENT WITH PREVIOUSLY REPORTED AND CALLED VALUE. [facility]   Magnesium     Status: None   Collection Time: 09/27/16  7:29 AM  Result Value Ref Range   Magnesium 1.9 1.7 - 2.4 mg/dL  Phosphorus     Status: None   Collection Time: 09/27/16  7:29 AM  Result Value Ref Range   Phosphorus 3.7 2.5 - 4.6 mg/dL  Glucose, capillary     Status: Abnormal   Collection Time: 09/27/16  8:11 AM  Result Value Ref Range   Glucose-Capillary 115 (H) 65 - 99 mg/dL  Glucose, capillary     Status: Abnormal   Collection Time: 09/27/16 11:52 AM  Result Value Ref Range   Glucose-Capillary 135 (H) 65 - 99 mg/dL  Troponin I     Status: Abnormal   Collection Time: 09/27/16  1:45 PM  Result Value Ref Range   Troponin I 1.12 (HH) <0.03 ng/mL    Comment: CRITICAL VALUE NOTED. VALUE IS CONSISTENT WITH PREVIOUSLY REPORTED/CALLED VALUE [facility]     Current Facility-Administered Medications  Medication Dose Route Frequency Provider Last Rate Last Dose  . 0.9 %  sodium chloride infusion  250 mL Intravenous PRN Demetrios Loll, MD      . acetaminophen (TYLENOL) tablet 650 mg  650 mg Oral Q6H PRN Demetrios Loll, MD   650 mg at 09/18/2016 2030   Or  . acetaminophen (TYLENOL) suppository 650 mg  650 mg Rectal Q6H PRN Demetrios Loll, MD      . albuterol (PROVENTIL) (2.5 MG/3ML) 0.083% nebulizer solution 2.5 mg  2.5 mg Nebulization Q2H PRN Demetrios Loll, MD   2.5 mg at 09/27/16 0145  . aspirin EC tablet 81 mg  81 mg Oral Daily  Shaune Pollack, MD   81 mg at 09/26/16 1453  . Chlorhexidine Gluconate Cloth 2 % PADS 6 each  6 each Topical Q0600 Adrian Saran,  MD   6 each at 09/27/16 0600  . dextrose 10 % infusion   Intravenous Continuous Lateef, [place], MD 40 mL/hr at 09/27/16 0612    . epoetin alfa (EPOGEN,PROCRIT) injection 10,000 Units  10,000 Units Intravenous Q M,W,F-HD Cherylann Ratel, [place], MD   10,000 Units at 09/26/16 1150  . feeding supplement (ENSURE ENLIVE) (ENSURE ENLIVE) liquid 237 mL  237 mL Oral BID BM Mody, Sital, MD   237 mL at 09/27/16 1519  . heparin injection 5,000 Units  5,000 Units Subcutaneous Q8H Shaune Pollack, MD   5,000 Units at 09/27/16 1520  . HYDROcodone-acetaminophen (NORCO/VICODIN) 5-325 MG per tablet 1 tablet  1 tablet Oral Q6H PRN Shane Crutch, MD      . HYDROcodone-acetaminophen (NORCO/VICODIN) 5-325 MG per tablet 1-2 tablet  1-2 tablet Oral Q4H PRN Shaune Pollack, MD   2 tablet at 09/25/16 0346  . mirtazapine (REMERON SOL-TAB) disintegrating tablet 7.5 mg  7.5 mg Oral QHS Alfonso Carden T, MD      . multivitamin (RENA-VIT) tablet 1 tablet  1 tablet Oral QHS Mody, Sital, MD      . mupirocin ointment (BACTROBAN) 2 % 1 application  1 application Nasal BID Adrian Saran, MD   1 application at 09/26/16 2209  . omega-3 acid ethyl esters (LOVAZA) capsule 1 g  1 g Oral BID Adrian Saran, MD      . ondansetron (ZOFRAN) tablet 4 mg  4 mg Oral Q6H PRN Shaune Pollack, MD       Or  . ondansetron [facility]) injection 4 mg  4 mg Intravenous Q6H PRN Shaune Pollack, MD   4 mg at 09/27/16 1104  . phenylephrine (NEO-SYNEPHRINE) 10 mg in sodium chloride 0.9 % 250 mL (0.04 mg/mL) infusion  0-400 mcg/min Intravenous Titrated Eugenie Norrie, NP   Stopped at 09/26/16 1529  . piperacillin-tazobactam (ZOSYN) IVPB 3.375 g  3.375 g Intravenous Q12H Cindi Carbon, RPH 12.5 mL/hr at 09/27/16 0933 3.375 g at 09/27/16 0933  . potassium chloride SA (K-DUR,KLOR-CON) CR tablet 40 mEq  40 mEq Oral Once Adrian Saran, MD      . senna-docusate (Senokot-S) tablet 1 tablet  1 tablet Oral QHS PRN Eugenie Norrie, NP      . sodium chloride flush (NS) 0.9 % injection 3 mL  3  mL Intravenous Q12H Shaune Pollack, MD   3 mL at 09/27/16 [phone]  . sodium chloride flush (NS) 0.9 % injection 3 mL  3 mL Intravenous PRN Shaune Pollack, MD   3 mL at 09/27/16 0935  . [START ON 09/29/2016] vancomycin (VANCOCIN) IVPB 750 mg/150 ml premix  750 mg Intravenous Q M,W,F-HD Adrian Saran, MD      . Melene Muller ON 09/30/2016] Vitamin D (Ergocalciferol) (DRISDOL) capsule 50,000 Units  50,000 Units Oral Q7 days Shaune Pollack, MD        Musculoskeletal: Strength & Muscle Tone: decreased Gait & Station: unable to stand Patient leans: Backward  Psychiatric Specialty Exam: Physical Exam  Nursing note and vitals reviewed. Constitutional: He appears well-developed. He appears distressed.  HENT:  Head: Normocephalic and atraumatic.  Eyes: Pupils are equal, round, and reactive to light. Conjunctivae are normal.  Neck: Normal range of motion.  Cardiovascular: Regular rhythm and normal heart sounds.   Hypotensive  Respiratory: He is in respiratory distress.  GI: Soft.  Musculoskeletal: Normal  range of motion.  Neurological: He is alert.  Skin: Skin is warm and dry.     Psychiatric: His affect is blunt. His speech is delayed. He is slowed and withdrawn. Thought content is not paranoid. Cognition and memory are impaired. He expresses inappropriate judgment. He expresses suicidal ideation. He expresses no suicidal plans. He is noncommunicative. He exhibits abnormal recent memory. He is inattentive.    Review of Systems  Constitutional: Positive for malaise/fatigue and weight loss.  HENT: Negative.   Eyes: Negative.   Respiratory: Negative.   Cardiovascular: Negative.   Gastrointestinal: Positive for nausea and vomiting.  Musculoskeletal: Negative.   Skin: Negative.   Neurological: Positive for sensory change and weakness.  Psychiatric/Behavioral: Positive for memory loss and suicidal ideas. Negative for depression, hallucinations and substance abuse. The patient has insomnia. The patient is not  nervous/anxious.     Blood pressure (!) 83/51, pulse 84, temperature 98.6 F (37 C), resp. rate (!) 22, height '5\' 6"'$  (1.676 m), weight 83 kg (182 lb 15.7 oz), SpO2 100 %.Body mass index is 29.53 kg/m.  General Appearance: Casual  Eye Contact:  Minimal  Speech:  Garbled and Slow  Volume:  Decreased  Mood:  Dysphoric and Hopeless  Affect:  Constricted  Thought Process:  Disorganized  Orientation:  Other:  Hard to tell. I think he knows he is in the hospital but he is not able to rouse the energy to make more specific answers to these questions  Thought Content:  Illogical, Rumination and Tangential  Suicidal Thoughts:  Yes.  without intent/plan  Homicidal Thoughts:  No  Memory:  Immediate;   Fair Recent;   Poor Remote;   Fair  Judgement:  Impaired  Insight:  Shallow  Psychomotor Activity:  Decreased  Concentration:  Concentration: Poor  Recall:  [employer] of Knowledge:  Fair  Language:  Fair  Akathisia:  No  Handed:  Right  AIMS (if indicated):     Assets:  Housing Social Support  ADL's:  Impaired  Cognition:  Impaired,  Moderate  Sleep:        Treatment Plan Summary: Plan not actively suicidal and not psychotic but run down and partially resigned. Possibly going to be helped by antidepressants oh continue the current dose of mirtazapine. Patient was counseled very strongly about how he needs to express his desire to live by trying to eat and trying to get his strength back. It looks like he is going to be discharged soon from the intensive care unit. I will continue to follow-up in the hospital. Patient said that his wife has not come to visit him. He was very vague in describing her but sounds like he is disappointed.  Disposition: Supportive therapy provided about ongoing stressors.  Alethia Berthold, MD 09/27/2016 3:23 PM

## 2016-09-27 NOTE — Progress Notes (Signed)
Medical interpreter came to bedside.  Plan of care discussed with patient through interpreter.  Questions answered.  Patient seemed satisfied with care for this shift.  Patient is able to understand some english and to made needs know.

## 2016-09-28 LAB — BASIC METABOLIC PANEL
Anion gap: 11 (ref 5–15)
BUN: 33 mg/dL — AB (ref 6–20)
CHLORIDE: 95 mmol/L — AB (ref 101–111)
CO2: 26 mmol/L (ref 22–32)
CREATININE: 4.49 mg/dL — AB (ref 0.61–1.24)
Calcium: 8.8 mg/dL — ABNORMAL LOW (ref 8.9–10.3)
GFR calc Af Amer: 14 mL/min — ABNORMAL LOW (ref >60)
GFR calc non Af Amer: 12 mL/min — ABNORMAL LOW (ref >60)
Glucose, Bld: 119 mg/dL — ABNORMAL HIGH (ref 65–99)
Potassium: 3.6 mmol/L (ref 3.5–5.1)
SODIUM: 132 mmol/L — AB (ref 135–145)

## 2016-09-28 LAB — GLUCOSE, CAPILLARY
GLUCOSE-CAPILLARY: 117 mg/dL — AB (ref 65–99)
GLUCOSE-CAPILLARY: 117 mg/dL — AB (ref 65–99)
Glucose-Capillary: 100 mg/dL — ABNORMAL HIGH (ref 65–99)
Glucose-Capillary: 120 mg/dL — ABNORMAL HIGH (ref 65–99)
Glucose-Capillary: 124 mg/dL — ABNORMAL HIGH (ref 65–99)
Glucose-Capillary: 140 mg/dL — ABNORMAL HIGH (ref 65–99)

## 2016-09-28 LAB — CBC
HCT: 27 % — ABNORMAL LOW (ref 40.0–52.0)
Hemoglobin: 9.2 g/dL — ABNORMAL LOW (ref 13.0–18.0)
MCH: 34.2 pg — ABNORMAL HIGH (ref 26.0–34.0)
MCHC: 34 g/dL (ref 32.0–36.0)
MCV: 100.5 fL — AB (ref 80.0–100.0)
PLATELETS: 159 10*3/uL (ref 150–440)
RBC: 2.68 MIL/uL — AB (ref 4.40–5.90)
RDW: 16.3 % — AB (ref 11.5–14.5)
WBC: 8.8 10*3/uL (ref 3.8–10.6)

## 2016-09-28 MED ORDER — MIDODRINE HCL 5 MG PO TABS
10.0000 mg | ORAL_TABLET | Freq: Three times a day (TID) | ORAL | Status: DC
  Administered 2016-09-28 – 2016-09-30 (×4): 10 mg via ORAL
  Filled 2016-09-28 (×10): qty 2

## 2016-09-28 NOTE — Progress Notes (Signed)
[facility]  ROUNDING NOTE   Subjective:  Still having pain in his lower extremeties. He will be due for dialysis again tomorrow.   Objective:  Vital signs in last 24 hours:  Temp:  [97.6 F (36.4 C)-98.5 F (36.9 C)] 98.5 F (36.9 C) (09/23 1200) Pulse Rate:  [37-98] 91 (09/23 1200) Resp:  [17-24] 20 (09/23 1200) BP: (71-105)/(46-71) 90/71 (09/23 1200) SpO2:  [93 %-100 %] 94 % (09/23 1200) Weight:  [86 kg (189 lb 9.5 oz)] 86 kg (189 lb 9.5 oz) (09/23 0500)  Weight change: 3 kg (6 lb 9.8 oz) Filed Weights   09/26/16 0458 09/27/16 0500 09/28/16 0500  Weight: 81.2 kg (179 lb 0.2 oz) 83 kg (182 lb 15.7 oz) 86 kg (189 lb 9.5 oz)    Intake/Output: I/O last 3 completed shifts: In: 1473 [P.O.:60; I.V.:1363; IV Piggyback:50] Out: -    Intake/Output this shift:  No intake/output data recorded.  Physical Exam: General: No acute distress  Head: Normocephalic, atraumatic. Moist oral mucosal membranes  Eyes: Anicteric  Neck: Supple, trachea midline  Lungs:  Clear to auscultation, normal effort  Heart: S1S2 no rubs  Abdomen:  Soft, nontender, bowel sounds present  Extremities: Bilateral lower extremeties wrapped, 1+ b/l LE edema.  Neurologic: Awake, alert, following commands  Skin: No rashes  Access: LUE AV access    Basic Metabolic Panel:  Recent Labs Lab 10-08-16 1221 09/25/16 0338 09/26/16 0208 09/27/16 0729 09/28/16 0628  NA 133* 131* 129* 131* 132*  K 2.6* 3.5 3.5 3.3* 3.6  CL 98* 97* 94* 98* 95*  CO2 GLUCOSE 37* 61* 133* 128* 119*  BUN 15 22* 31* 23* 33*  CREATININE 3.00* 3.87* 4.67* 3.51* 4.49*  CALCIUM 8.5* 8.0* 8.5* 8.5* 8.8*  MG 1.8  --   --  1.9  --   PHOS  --   --   --  3.7  --     Liver Function Tests:  Recent Labs Lab 10-08-16 1221 09/25/16 0338  AST 45*  --   ALT 20  --   ALKPHOS 124  --   BILITOT 1.9* 2.5*  PROT 7.2  --   ALBUMIN 2.3*  --    No results for input(s): LIPASE, AMYLASE in the last 168  hours. No results for input(s): AMMONIA in the last 168 hours.  CBC:  Recent Labs Lab 10/08/16 1221 09/25/16 0338 09/26/16 0208 09/27/16 0729 09/28/16 0628  WBC 6.6 9.5 12.6* 11.3* 8.8  NEUTROABS 4.7  --   --   --   --   HGB 10.0* 9.5* 9.5* 9.3* 9.2*  HCT 28.9* 27.3* 28.6* 27.3* 27.0*  MCV 100.2* 101.3* 100.3* 102.0* 100.5*  PLT 189 151 168 138* 159    Cardiac Enzymes:  Recent Labs Lab 09/25/16 2138 09/26/16 0208 09/26/16 2135 09/27/16 0729 09/27/16 1345  TROPONINI 1.27* 1.38* 1.40* 1.27* 1.12*    BNP: Invalid input(s): POCBNP  CBG:  Recent Labs Lab 09/27/16 1956 09/27/16 2357 09/28/16 0437 09/28/16 0812 09/28/16 1208  GLUCAP 122* 140* 124* 100* 117*    Microbiology: Results for orders placed or performed during the hospital encounter of 10-08-2016  Blood Culture (routine x 2)     Status: None (Preliminary result)   Collection Time: 10/08/16 12:21 PM  Result Value Ref Range Status   Specimen Description BLOOD RIGHT [facility]  Final   Special Requests   Final    BOTTLES DRAWN AEROBIC AND ANAEROBIC Blood Culture adequate volume  Culture NO GROWTH 4 DAYS  Final   Report Status PENDING  Incomplete  Blood Culture (routine x 2)     Status: None (Preliminary result)   Collection Time: 10-02-16 12:21 PM  Result Value Ref Range Status   Specimen Description BLOOD BLOOD RIGHT HAND  Final   Special Requests   Final    BOTTLES DRAWN AEROBIC AND ANAEROBIC Blood Culture results may not be optimal due to an inadequate volume of blood received in culture bottles   Culture NO GROWTH 4 DAYS  Final   Report Status PENDING  Incomplete  Surgical PCR screen     Status: Abnormal   Collection Time: 10/02/16  6:31 PM  Result Value Ref Range Status   MRSA, PCR POSITIVE (A) NEGATIVE Final    Comment: RESULT CALLED TO, READ BACK BY AND VERIFIED WITH: CRYSTAL BASS AT 2041 02-Oct-2016 BY TFK    Staphylococcus aureus POSITIVE (A) NEGATIVE Final    Comment: (NOTE) The Xpert SA Assay  (FDA approved for NASAL specimens in patients 62 years of age and older), is one component of a comprehensive surveillance program. It is not intended to diagnose infection nor to guide or monitor treatment.   Aerobic Culture (superficial specimen)     Status: None (Preliminary result)   Collection Time: 09/25/16  2:30 PM  Result Value Ref Range Status   Specimen Description FOOT  Final   Special Requests Normal  Final   Gram Stain   Final    RARE WBC PRESENT,BOTH PMN AND MONONUCLEAR RARE GRAM POSITIVE COCCI RARE GRAM NEGATIVE RODS    Culture   Final    MODERATE STAPHYLOCOCCUS AUREUS SUSCEPTIBILITIES TO FOLLOW WITHIN MIXED CULTURE Performed at [facility], [address], [place], [place] [address]    Report Status PENDING  Incomplete    Coagulation Studies: No results for input(s): LABPROT, INR in the last 72 hours.  Urinalysis: No results for input(s): COLORURINE, LABSPEC, PHURINE, GLUCOSEU, HGBUR, BILIRUBINUR, KETONESUR, PROTEINUR, UROBILINOGEN, NITRITE, LEUKOCYTESUR in the last 72 hours.  Invalid input(s): APPERANCEUR    Imaging: Dg Abd 1 View  Result Date: 09/26/2016 CLINICAL DATA:  Vomiting.  Sepsis. EXAM: ABDOMEN - 1 VIEW COMPARISON:  Ultrasound of the abdomen 09/25/2016. FINDINGS: The bowel gas pattern is normal. No radio-opaque calculi are seen. Prior median sternotomy. Ovoid calcification LEFT upper quadrant appears to represent a splenic cyst. Vascular calcification. IMPRESSION: Negative exam. Electronically Signed   By: Elsie Stain M.D.   On: 09/26/2016 15:03     Medications:   . sodium chloride    . dextrose 40 mL/hr at 09/27/16 2000  . phenylephrine (NEO-SYNEPHRINE) Adult infusion Stopped (09/26/16 1529)  . piperacillin-tazobactam (ZOSYN)  IV 3.375 g (09/28/16 0954)  . [START ON 09/29/2016] vancomycin     . aspirin EC  81 mg Oral Daily  . Chlorhexidine Gluconate Cloth  6 each Topical Q0600  . epoetin (EPOGEN/PROCRIT) injection  10,000 Units  Intravenous Q M,W,F-HD  . feeding supplement (ENSURE ENLIVE)  237 mL Oral BID BM  . heparin  5,000 Units Subcutaneous Q8H  . midodrine  10 mg Oral TID WC  . mirtazapine  7.5 mg Oral QHS  . multivitamin  1 tablet Oral QHS  . mupirocin ointment  1 application Nasal BID  . omega-3 acid ethyl esters  1 g Oral BID  . sodium chloride flush  3 mL Intravenous Q12H  . [START ON 09/30/2016] Vitamin D (Ergocalciferol)  50,000 Units Oral Q7 days   sodium chloride,  acetaminophen **OR** acetaminophen, albuterol, HYDROcodone-acetaminophen, HYDROcodone-acetaminophen, ondansetron **OR** ondansetron (ZOFRAN) IV, senna-docusate, sodium chloride flush  Assessment/ Plan:  66 y.o. male with diabetes, hypertension, coronary artery disease s/p CABG 2011, anemia of CKD, SHPTH, who presents now with b/l LE ulcerations  MWF [facility] [address]   1. End Stage Renal Disease: Patient will be due for dialysis again tomorrow, orders will be prepared.    2. Hypotension. Now on midodrine  po tid, was on pressors, continue to monitor BP especially when on dialysis.   3. Anemia of chronic kidney disease. Continue Epogen with dialysis, hgb currently 9.2.   4. Secondary Hyperparathyroidism:  Recheck phosphorus tomorrow with dialysis.   LOS: 4 Sontee Desena 9/23/20181:25 PM

## 2016-09-28 NOTE — Progress Notes (Signed)
[employer] [facility] [facility] [place] at [facility]   PATIENT NAME: Travis [name]    MR#:  [id number]  DATE OF BIRTH:  09/30/1950  SUBJECTIVE:   Patient denies nausea and vomiting is morning. He has tolerated his diet. He says he ate fruit last night. He denies chest pain shortness of breath.  REVIEW OF SYSTEMS:    Review of Systems  Constitutional: He has generalized weakness  HENT: Negative for ear pain, nosebleeds, congestion, facial swelling, rhinorrhea, neck pain, neck stiffness and ear discharge.   Respiratory: Negative for cough, shortness of breath, wheezing  Cardiovascular: Negative for chest pain, palpitations and leg swelling.  Gastrointestinal: Negative for heartburn, abdominal pain, ositos nausea and vomiting no diarrhea or constipation Genitourinary: Negative for dysuria, urgency, frequency, hematuria Musculoskeletal: Negative for back pain or joint pain Neurological: Negative for dizziness, seizures, syncope, focal weakness,  numbness and headaches.  Hematological: Does  bruise/bleed easily.  Psychiatric/Behavioral: Negative for hallucinations, confusion, dysphoric mood SKIN: b/l wound infections   Tolerating Diet:  yes    DRUG ALLERGIES:  No Known Allergies  VITALS:  Blood pressure (!) 79/47, pulse 98, temperature 98.4 F (36.9 C), temperature source Oral, resp. rate (!) 21, height  (1.676 m), weight 86 kg (189 lb 9.5 oz), SpO2 97 %.  PHYSICAL EXAMINATION:  Constitutional: Appears Chronically ill appearing HENT: Normocephalic. Marland Kitchen Oropharynx is clear and moist.  Eyes: Conjunctivae and EOM are normal. PERRLA, no scleral icterus.  Neck: Normal ROM. Neck supple. No JVD. No tracheal deviation. CVS: Irregular, irregular S1/S2 +, no murmurs, no gallops, no carotid bruit.  Pulmonary: Effort and breath sounds normal, no stridor, rhonchi, wheezes, rales.  Abdominal: Soft. BS +,  no distension, tenderness, rebound or guarding.  Musculoskeletal: He is able to move  all extremities.  Neuro: Alert. CN 2-12 grossly intact. No focal deficits. Skin: b/l LE ulcerations That are wrapped and foul-smelling  Psychiatric: Depressed  mood and affect.      LABORATORY PANEL:   CBC  Recent Labs Lab 09/28/16 0628  WBC 8.8  HGB 9.2*  HCT 27.0*  PLT 159   ------------------------------------------------------------------------------------------------------------------  Chemistries   Recent Labs Lab 09/11/2016 1221 09/25/16 0338  09/27/16 0729 09/28/16 0628  NA 133* 131*  < > 131* 132*  K 2.6* 3.5  < > 3.3* 3.6  CL 98* 97*  < > 98* 95*  CO2 27 24  < > 24 26  GLUCOSE 37* 61*  < > 128* 119*  BUN 15 22*  < > 23* 33*  CREATININE 3.00* 3.87*  < > 3.51* 4.49*  CALCIUM 8.5* 8.0*  < > 8.5* 8.8*  MG 1.8  --   --  1.9  --   AST 45*  --   --   --   --   ALT 20  --   --   --   --   ALKPHOS 124  --   --   --   --   BILITOT 1.9* 2.5*  --   --   --   < > = values in this interval not displayed. ------------------------------------------------------------------------------------------------------------------  Cardiac Enzymes  Recent Labs Lab 09/26/16 2135 09/27/16 0729 09/27/16 1345  TROPONINI 1.40* 1.27* 1.12*   ------------------------------------------------------------------------------------------------------------------  RADIOLOGY:  Dg Abd 1 View  Result Date: 09/26/2016 CLINICAL DATA:  Vomiting.  Sepsis. EXAM: ABDOMEN - 1 VIEW COMPARISON:  Ultrasound of the abdomen 09/25/2016. FINDINGS: The bowel gas pattern is normal. No radio-opaque calculi are seen. Prior median sternotomy. Ovoid calcification  LEFT upper quadrant appears to represent a splenic cyst. Vascular calcification. IMPRESSION: Negative exam. Electronically Signed   By: Elsie Stain M.D.   On: 09/26/2016 15:03     ASSESSMENT AND PLAN:   66 y.o. male CAD s/p CABG, hx of STEMI, T2DM, ESRD on HD, PVD w/ a hx of bilateral toe and transmetatarsal amputation and chronic non-healing LE  wounds. He is followed in [facility] wound clinic complicated by medical non-compliance presented to [facility] ED upon referral from hemodialysis for hypoglycemia and B/L lower extremity wounds.   1. Septic shock with hypotension, tachycardia and fevers from bilateral LE wounds Septic shock has resolved. He has low blood pressure however MAP is greater than 65. Continue broad-spectrum antibiotics Blood cultures remain negative.  2. Bilateral lower extremity wounds: Patient is followed at [facility] wound clinic. He has extensive eschar on all wounds. Patient has been seen and evaluated by surgery as well as podiatry and vascular surgery.  Patient is high risk for above-knee amputations and does not desire above-knee amputations. As per vascular surgery no role for debridement for local procedures for angiogram at this point. If at any point the patient desires to have amputation and vascular surgery can perform it here or patient may follow-up with his vascular surgeon at [facility] and wound care physicians at [facility].   Management as per wound care as follows: To posterior right lower leg and anterior and posterior left lower leg wounds, cleanse wounds with NS, pat dry, apply large Xeroform, cover  wounds with ABDs, wrap with kerlix, adhere kerlix to kerlix, no tape on skin, perform daily and prn soiling. Prevalon boots to prevent foot ulcers. Foam dressing to sacral ulcer, change Q3D and prn soiling. Peel back and assess Q shift.  Continue vancomycin and Zosyn ID consultation not available until early this week.   Wound culture showing  PMNs,  gram-positive cocci and gram-negative rods  3. Hypoglycemia: Remains on  D10 drip due to poor by mouth intake  4. ESRD on hemodialysis: He will continue dialysis as scheduled by nephrology. Could consider adding Midorine for low BP prior to HD days  5. Chronic diastolic heart failure with preserved ejection fraction/chronic ischemic heart disease with history of CABG: No  signs of acute exacerbation at this time   6. Hypokalemia: Replete when necessary  7. Hyponatremia: Relatively stable  8. Anemia of chronic disease: Hemoglobin remained stable Will need Epogen during dialysis Hemoglobin range stable  9. Elevated bilirubin: Abdominal ultrasound without evidence of acute abdominal pathology.  10. Peripheral vascular disease: Continue aspirin and Plavix  11. Essential hypertension due to severe sepsis with hypotension blood pressure medications discontinued for now  12. Chronic atrial fibrillation:CHADS2VASC score 4 As per his cardiologist he is not a candidate for anticoagulation given his renal disease and noncompliance with increased risk of high bleeding and falls   13. Elevated troponin in the setting of septic shock and poor renal clearance: Discussed with intensivist he did not feel cardiology consultation was needed.  14. Nausea and vomiting: Right upper quadrant ultrasound shows sludge and KUB shows no acute abnormality This has resolved  15. Depression: Patient was evaluated by psychiatry. Remeron 7.5 mg added to his regimen, more for appetite then depression.  16. Moderate malnutrition: Continue nutritional supplements and omega-3 for wound healing.   Patient has very poor overall prognosis. Awaiting palliative care consultation. Management plans discussed with the patient and he is in agreement.  CODE STATUS: FULL   TOTAL TIME TAKING CARE OF  THIS PATIENT: 24 minutes.   POSSIBLE D/C ??, DEPENDING ON CLINICAL CONDITION.  Discussed with intensivist Giavanni Zeitlin M.D on 09/28/2016 at 8:02 AM  Between 7am to 6pm - Pager - [phone] After 6pm go to [email or web address] - Social research officer, government  [facility]  Office  [phone]  CC: Primary care physician; Center, [facility]  Note: This dictation was prepared with Nurse, children's dictation along with smaller phrase technology. Any transcriptional errors that  result from this process are unintentional.

## 2016-09-28 NOTE — Progress Notes (Signed)
Pt grimacing and pointing to right hip; Repositioned in bed and attempted to give two tabs hydrocodone per order but pt refused swallowing them.  Contacted interpreter to explain plan of care, and pt consented. Pills crushed in applesauce and administered.  Repositioined again with pillows.  Pt refused his leg dressing changes for now due to pain level.  Lurene Shadow, RN

## 2016-09-28 NOTE — Progress Notes (Signed)
[facility] Critical Care Medicine Progess Note    SYNOPSIS   66 y o with ESRD, PVD, gangrenous foot ulcers, s/p septic shock.   ASSESSMENT/PLAN   Severe Septic Shock with hypotension likely secondary to bilateral lower extremity wounds-- Doing better, has been weaned off pressors.  Anemia without acute blood loss ESRD on hemodialysis Hypoglycemia.   Mildly elevated troponin likely secondary to demand ischemia Atrial Fibrillation-rate controlled  P:  Continue abx ID, Vascular, Nephrology, General Surgery, and Podiatry consulted appreciate input  Continuous telemetry monitoring  Continue D10W  ml/hr; encourage PO intake.  Prn norco for pain management  Subq heparin for VTE prophylaxis  --Prognosis appears poor, pt can not go to floor due to persistent low BP. Will start midodrine. Palliative care consulted, d/w hospitalist; tentative plan is home with hospice.     Micro/culture results: MRSA PCR positive.  BCx2 NTD UC -- Sputum--  Antibiotics: Zosyn 9/19>>  MAJOR EVENTS/TEST RESULTS:   Best Practices  DVT Prophylaxis: heparin GI Prophylaxis: --   ---------------------------------------   ----------------------------------------   Name: Travis [name] MRN: [id number] DOB: [birth year]    ADMISSION DATE:  10-06-16   SUBJECTIVE:   No new complaints today, pt wants to go home.    Review of Systems:  Could not be obtained, pt sleeping.    VITAL SIGNS: Temp:  [97.6 F (36.4 C)-98.4 F (36.9 C)] 98.1 F (36.7 C) (09/23 0800) Pulse Rate:  [37-98] 72 (09/23 0800) Resp:  [17-24] 20 (09/23 0800) BP: (71-105)/(46-70) 82/54 (09/23 0800) SpO2:  [93 %-100 %] 97 % (09/23 0800) Weight:  [189 lb 9.5 oz (86 kg)] 189 lb 9.5 oz (86 kg) (09/23 0500)     PHYSICAL EXAMINATION: Physical Examination:   VS: BP (!) 82/54   Pulse 72   Temp 98.1 F (36.7 C)   Resp 20   Ht  (1.676 m)   Wt 189 lb 9.5 oz (86 kg)   SpO2 97%   BMI 30.60 kg/m   General  Appearance: No distress  Neuro:without focal findings, mental status normal. HEENT: PERRLA, EOM intact. Pulmonary: normal breath sounds   CardiovascularNormal S1,S2.  No m/r/g.   Abdomen: Benign, Soft, non-tender. Renal:  No costovertebral tenderness  GU:  Not performed at this time. Endocrine: No evident thyromegaly. Skin:   warm, no rashes, no ecchymosis  Extremities: --    LABORATORY PANEL:   CBC  Recent Labs Lab 09/28/16 0628  WBC 8.8  HGB 9.2*  HCT 27.0*  PLT 159    Chemistries   Recent Labs Lab 10/06/2016 1221 09/25/16 0338  09/27/16 0729 09/28/16 0628  NA 133* 131*  < > 131* 132*  K 2.6* 3.5  < > 3.3* 3.6  CL 98* 97*  < > 98* 95*  CO2 27 24  < > 24 26  GLUCOSE 37* 61*  < > 128* 119*  BUN 15 22*  < > 23* 33*  CREATININE 3.00* 3.87*  < > 3.51* 4.49*  CALCIUM 8.5* 8.0*  < > 8.5* 8.8*  MG 1.8  --   --  1.9  --   PHOS  --   --   --  3.7  --   AST 45*  --   --   --   --   ALT 20  --   --   --   --   ALKPHOS 124  --   --   --   --   BILITOT 1.9* 2.5*  --   --   --   < > =  values in this interval not displayed.   Recent Labs Lab 09/27/16 1152 09/27/16 1623 09/27/16 1956 09/27/16 2357 09/28/16 0437 09/28/16 0812  GLUCAP 135* 147* 122* 140* 124* 100*   No results for input(s): PHART, PCO2ART, PO2ART in the last 168 hours.  Recent Labs Lab 10/11/2016 1221 09/25/16 0338  AST 45*  --   ALT 20  --   ALKPHOS 124  --   BILITOT 1.9* 2.5*  ALBUMIN 2.3*  --     Cardiac Enzymes  Recent Labs Lab 09/27/16 1345  TROPONINI 1.12*    RADIOLOGY:  Dg Abd 1 View  Result Date: 09/26/2016 CLINICAL DATA:  Vomiting.  Sepsis. EXAM: ABDOMEN - 1 VIEW COMPARISON:  Ultrasound of the abdomen 09/25/2016. FINDINGS: The bowel gas pattern is normal. No radio-opaque calculi are seen. Prior median sternotomy. Ovoid calcification LEFT upper quadrant appears to represent a splenic cyst. Vascular calcification. IMPRESSION: Negative exam. Electronically Signed   By: Elsie Stain M.D.   On: 09/26/2016 15:03        --Wells Guiles, MD.  ICU Pager: [phone] [place] [facility] Office Number: [phone]   09/28/2016

## 2016-09-29 ENCOUNTER — Inpatient Hospital Stay: Payer: Medicare Other

## 2016-09-29 DIAGNOSIS — F05 Delirium due to known physiological condition: Secondary | ICD-10-CM

## 2016-09-29 DIAGNOSIS — T148XXA Other injury of unspecified body region, initial encounter: Secondary | ICD-10-CM

## 2016-09-29 DIAGNOSIS — Z7189 Other specified counseling: Secondary | ICD-10-CM

## 2016-09-29 DIAGNOSIS — L03115 Cellulitis of right lower limb: Secondary | ICD-10-CM

## 2016-09-29 DIAGNOSIS — Z515 Encounter for palliative care: Secondary | ICD-10-CM

## 2016-09-29 DIAGNOSIS — L089 Local infection of the skin and subcutaneous tissue, unspecified: Secondary | ICD-10-CM

## 2016-09-29 LAB — CULTURE, BLOOD (ROUTINE X 2)
CULTURE: NO GROWTH
Culture: NO GROWTH
SPECIAL REQUESTS: ADEQUATE

## 2016-09-29 LAB — GLUCOSE, CAPILLARY
GLUCOSE-CAPILLARY: 102 mg/dL — AB (ref 65–99)
GLUCOSE-CAPILLARY: 112 mg/dL — AB (ref 65–99)
GLUCOSE-CAPILLARY: 89 mg/dL (ref 65–99)
GLUCOSE-CAPILLARY: 92 mg/dL (ref 65–99)
GLUCOSE-CAPILLARY: 95 mg/dL (ref 65–99)
Glucose-Capillary: 109 mg/dL — ABNORMAL HIGH (ref 65–99)

## 2016-09-29 LAB — CBC
HEMATOCRIT: 28.3 % — AB (ref 40.0–52.0)
HEMOGLOBIN: 9.9 g/dL — AB (ref 13.0–18.0)
MCH: 35.6 pg — AB (ref 26.0–34.0)
MCHC: 35.1 g/dL (ref 32.0–36.0)
MCV: 101.3 fL — AB (ref 80.0–100.0)
Platelets: 141 10*3/uL — ABNORMAL LOW (ref 150–440)
RBC: 2.79 MIL/uL — AB (ref 4.40–5.90)
RDW: 16.3 % — ABNORMAL HIGH (ref 11.5–14.5)
WBC: 9.5 10*3/uL (ref 3.8–10.6)

## 2016-09-29 LAB — AEROBIC CULTURE  (SUPERFICIAL SPECIMEN)

## 2016-09-29 LAB — BASIC METABOLIC PANEL
ANION GAP: 12 (ref 5–15)
BUN: 44 mg/dL — ABNORMAL HIGH (ref 6–20)
CALCIUM: 8.7 mg/dL — AB (ref 8.9–10.3)
CHLORIDE: 95 mmol/L — AB (ref 101–111)
CO2: 23 mmol/L (ref 22–32)
Creatinine, Ser: 5.36 mg/dL — ABNORMAL HIGH (ref 0.61–1.24)
GFR calc non Af Amer: 10 mL/min — ABNORMAL LOW (ref >60)
GFR, EST AFRICAN AMERICAN: 12 mL/min — AB (ref >60)
Glucose, Bld: 104 mg/dL — ABNORMAL HIGH (ref 65–99)
POTASSIUM: 4.7 mmol/L (ref 3.5–5.1)
Sodium: 130 mmol/L — ABNORMAL LOW (ref 135–145)

## 2016-09-29 LAB — VANCOMYCIN, RANDOM: Vancomycin Rm: 21

## 2016-09-29 LAB — AEROBIC CULTURE W GRAM STAIN (SUPERFICIAL SPECIMEN): Special Requests: NORMAL

## 2016-09-29 LAB — PHOSPHORUS: PHOSPHORUS: 5.3 mg/dL — AB (ref 2.5–4.6)

## 2016-09-29 MED ORDER — ALTEPLASE 2 MG IJ SOLR: 2.0000 mg | Freq: Once | INTRAMUSCULAR | Status: DC | PRN

## 2016-09-29 MED ORDER — SODIUM CHLORIDE 0.9 % IV SOLN
0.0000 ug/min | INTRAVENOUS | Status: DC
  Administered 2016-09-29: 20 ug/min via INTRAVENOUS
  Filled 2016-09-29: qty 4

## 2016-09-29 MED ORDER — SODIUM CHLORIDE 0.9 % IV SOLN: 100.0000 mL | INTRAVENOUS | Status: DC | PRN

## 2016-09-29 MED ORDER — MORPHINE SULFATE (PF) 2 MG/ML IV SOLN
1.0000 mg | INTRAVENOUS | Status: DC | PRN
  Administered 2016-09-29: 2 mg via INTRAVENOUS
  Filled 2016-09-29: qty 1

## 2016-09-29 MED ORDER — LIDOCAINE-PRILOCAINE 2.5-2.5 % EX CREA: 1.0000 "application " | TOPICAL_CREAM | CUTANEOUS | Status: DC | PRN

## 2016-09-29 MED ORDER — PENTAFLUOROPROP-TETRAFLUOROETH EX AERO: 1.0000 "application " | INHALATION_SPRAY | CUTANEOUS | Status: DC | PRN

## 2016-09-29 MED ORDER — LIDOCAINE HCL (PF) 1 % IJ SOLN: 5.0000 mL | INTRAMUSCULAR | Status: DC | PRN

## 2016-09-29 MED ORDER — MORPHINE SULFATE (PF) 2 MG/ML IV SOLN: 2.0000 mg | INTRAVENOUS | Status: DC | PRN

## 2016-09-29 MED ORDER — PHENTOLAMINE MESYLATE 5 MG IJ SOLR
5.0000 mg | Freq: Once | INTRAMUSCULAR | Status: AC
  Administered 2016-09-29: 5 mg via SUBCUTANEOUS
  Filled 2016-09-29: qty 5

## 2016-09-29 MED ORDER — SODIUM CHLORIDE 0.9% FLUSH: 10.0000 mL | INTRAVENOUS | Status: DC | PRN

## 2016-09-29 MED ORDER — SODIUM CHLORIDE 0.9% FLUSH
10.0000 mL | Freq: Two times a day (BID) | INTRAVENOUS | Status: DC
  Administered 2016-09-29 – 2016-09-30 (×3): 10 mL
  Administered 2016-10-01: 40 mL
  Administered 2016-10-01 – 2016-10-02 (×3): 10 mL
  Administered 2016-10-03: 10:00:00 30 mL
  Administered 2016-10-03 – 2016-10-04 (×2): 10 mL

## 2016-09-29 MED ORDER — HEPARIN SODIUM (PORCINE) 1000 UNIT/ML DIALYSIS: 1000.0000 [IU] | INTRAMUSCULAR | Status: DC | PRN

## 2016-09-29 MED ORDER — SODIUM CHLORIDE 0.9 % IV SOLN
0.0000 ug/min | INTRAVENOUS | Status: DC
  Administered 2016-09-29: 20 ug/min via INTRAVENOUS
  Filled 2016-09-29: qty 10

## 2016-09-29 NOTE — Progress Notes (Signed)
[facility] Critical Care Medicine Progess Note    SYNOPSIS   66 y o with ESRD, PVD, gangrenous foot ulcers, s/p septic shock-resolved  ASSESSMENT/PLAN   Severe Septic Shock with hypotension likely secondary to bilateral lower extremity wounds-- Doing better, has been weaned off pressors.  Anemia without acute blood loss ESRD on hemodialysis Hypoglycemia.   Mildly elevated troponin likely secondary to demand ischemia Atrial Fibrillation-rate controlled  P:  Continue abx ID, Vascular, Nephrology, General Surgery, and Podiatry consulted appreciate input  Continuous telemetry monitoring  Continue D10W  ml/hr; encourage PO intake.  Prn norco for pain management  Subq heparin for VTE prophylaxis  --Prognosis appears poor, pt can not go to floor due to persistent low BP.  Started on  midodrine. Palliative care consulted, d/w hospitalist; tentative plan is home with hospice.     Micro/culture results: MRSA PCR positive.  BCx2 NTD UC -- Sputum--  Antibiotics: Zosyn 9/19>>  MAJOR EVENTS/TEST RESULTS:   Best Practices  DVT Prophylaxis: heparin GI Prophylaxis: --   ---------------------------------------   ----------------------------------------   Name: Travis [name] MRN: [id number] DOB: 1950-08-30    ADMISSION DATE:  09/30/2016   SUBJECTIVE:   No new complaints today, pt wants to go home.  No acute distress No pain    Review of Systems:  Limited due to sleeping  VITAL SIGNS: Temp:  [98.2 F (36.8 C)-98.6 F (37 C)] 98.3 F (36.8 C) (09/24 0400) Pulse Rate:  [75-91] 86 (09/24 0400) Resp:  [17-22] 22 (09/24 0400) BP: (79-98)/(55-71) 79/57 (09/24 0400) SpO2:  [93 %-98 %] 93 % (09/24 0400) Weight:  [188 lb 11.4 oz (85.6 kg)] 188 lb 11.4 oz (85.6 kg) (09/24 0500)     PHYSICAL EXAMINATION: Physical Examination:   VS: BP (!) 79/57 (BP Location: Right Arm)   Pulse 86   Temp 98.3 F (36.8 C) (Oral)   Resp (!) 22   Ht  (1.676 m)   Wt  188 lb 11.4 oz (85.6 kg)   SpO2 93%   BMI 30.46 kg/m   General Appearance: No distress  Neuro:without focal findings, mental status normal. HEENT: PERRLA, EOM intact. Pulmonary: normal breath sounds   CardiovascularNormal S1,S2.  No m/r/g.   Abdomen: Benign, Soft, non-tender.    LABORATORY PANEL:   CBC  Recent Labs Lab 09/28/16 0628  WBC 8.8  HGB 9.2*  HCT 27.0*  PLT 159    Chemistries   Recent Labs Lab 09/12/2016 1221 09/25/16 0338  09/27/16 0729 09/28/16 0628  NA 133* 131*  < > 131* 132*  K 2.6* 3.5  < > 3.3* 3.6  CL 98* 97*  < > 98* 95*  CO2 27 24  < > 24 26  GLUCOSE 37* 61*  < > 128* 119*  BUN 15 22*  < > 23* 33*  CREATININE 3.00* 3.87*  < > 3.51* 4.49*  CALCIUM 8.5* 8.0*  < > 8.5* 8.8*  MG 1.8  --   --  1.9  --   PHOS  --   --   --  3.7  --   AST 45*  --   --   --   --   ALT 20  --   --   --   --   ALKPHOS 124  --   --   --   --   BILITOT 1.9* 2.5*  --   --   --   < > = values in this interval not displayed.   Recent  Labs Lab 09/28/16 1208 09/28/16 1653 09/28/16 1945 09/29/16 0001 09/29/16 0453 09/29/16 0731  GLUCAP 117* 117* 120* 112* 102* 109*   No results for input(s): PHART, PCO2ART, PO2ART in the last 168 hours.  Recent Labs Lab 10/03/2016 1221 09/25/16 0338  AST 45*  --   ALT 20  --   ALKPHOS 124  --   BILITOT 1.9* 2.5*  ALBUMIN 2.3*  --     Cardiac Enzymes  Recent Labs Lab 09/27/16 1345  TROPONINI 1.12*     Kallista Pae Santiago Glad, M.D.  [facility]  Medical Director [facility] Medical Director [facility] Cardio-Pulmonary Department

## 2016-09-29 NOTE — Progress Notes (Signed)
Line placement verified by Xray. Dr. Belia Heman read xray and gave verbal approval for the line to be used. Will continue to assess.

## 2016-09-29 NOTE — Progress Notes (Signed)
Peripherally Inserted Central Catheter/Midline Placement  The IV Nurse has discussed with the patient and/or persons authorized to consent for the patient, the purpose of this procedure and the potential benefits and risks involved with this procedure.  The benefits include less needle sticks, lab draws from the catheter, and the patient may be discharged home with the catheter. Risks include, but not limited to, infection, bleeding, blood clot (thrombus formation), and puncture of an artery; nerve damage and irregular heartbeat and possibility to perform a PICC exchange if needed/ordered by physician.  Alternatives to this procedure were also discussed.  B[employer] PICC patient education guide, fact sheet on infection prevention and patient information card has been provided to patient /or left at bedside.    PICC/Midline Placement Documentation        Lisabeth Devoid 09/29/2016, 12:13 PM Consent obtained from wife at bedside using Spanish interpreter.

## 2016-09-29 NOTE — Progress Notes (Signed)
Upon assessment patient appeared confused and disoriented. Interpreter was called to assist. The interpreter has worked with this patient before and states that the patient is not at base line and appears more confused than at prior interactions.  Pt is dozing off during conversation and has to be asked questions repeatedly. Pt is also uncoordinated in movements, while he appears to be trying to follow commands he is having difficultly understanding what we are asking him to do.  Will continue to assess.

## 2016-09-29 NOTE — Plan of Care (Signed)
Problem: Safety: Goal: Ability to remain free from injury will improve Outcome: Progressing Pt has remained free from injury on my shift and is able to express needs appropriately.  Problem: Pain Managment: Goal: General experience of comfort will improve Outcome: Not Progressing Pt continues to refuse to have dressings changed on BLE due to fear of pain. Morphine 2 mg given under PRN order. Pt now sleeping.  Problem: Tissue Perfusion: Goal: Risk factors for ineffective tissue perfusion will decrease Outcome: Not Progressing Pt is now on Neo-synephrine with poor mobility at this time.   Problem: Nutrition: Goal: Adequate nutrition will be maintained Outcome: Not Progressing Pt continues to have poor PO intake.

## 2016-09-29 NOTE — Progress Notes (Signed)
Pt notified this nurse he wishes to speak to Adela Glimpse from his place of worship. Pt unable to provide phone number. Patient spouse's number is in chart. Will try to contact regarding consent for PICC placement and to try to find [facility] number. Dr. Wynelle Link and Dr.Kasa notified of patients condition,no new orders at this time. Will continue to assess.

## 2016-09-29 NOTE — Progress Notes (Signed)
Pt informs nurse of pressure in the RLE near the groin area. Pt states that the pressure is converting into pain. Dr. Belia Heman informed. New orders given will continue to assess.

## 2016-09-29 NOTE — Progress Notes (Signed)
Pt continues to experience pain. Pt's central line is in place and functioning properly. Pt remains alert to self and place. Pt is confused to time and situation. Pt continues to be drowsy. BP controlled with Neo-synephrine. Report given to [place], [place]

## 2016-09-29 NOTE — Progress Notes (Signed)
Chaplain was making rounds and visited with pt in room ICU2. Chaplain sat bedside with pt and provided the ministry of prayer and a pastoral presence.    09/29/16 1105  Clinical Encounter Type  Visited With Patient  Visit Type Initial;Spiritual support  Referral From Nurse  Consult/Referral To Chaplain  Spiritual Encounters  Spiritual Needs Prayer

## 2016-09-29 NOTE — Consult Note (Signed)
Consultation Note Date: 09/29/2016   Patient Name: Travis [name]  DOB: 07-14-1950  MRN: [id number]  Age / Sex: 66 y.o., male  [facility]P: [facility] [place] Referring Physician: Demetrios Loll, MD  Reason for Consultation: Establishing goals of care  HPI/Patient Profile: 66 y.o. male  with past medical history of diastolic CHF, CAD, STEMI, ESRD on HD, diabetes mellitus, osteomyelitis, recent transmetatarsal amputation admitted on 09/18/2016 with worsening leg pain with infection and hypoglycemia. Continues with gangrenous foot ulcers and vasopressors. Not eating (per wife was not eating at home either). Prognosis is very poor.   Clinical Assessment and Goals of Care: I met today at Travis [name] bedside along with Spanish interpretor, Travis [name]. We were soon joined by Travis [name] wife Travis [name] (whom he had just requested that we call). Travis [name] is somewhat confused but is talking about being near death and thanking Travis [name] and everyone in room for everything that his been done to help him.   I spoke more with Travis [name] after PICC consent was obtained. With help of interpretor I explained that Travis [name] is critically ill and that we are concerned we cannot make him well this time. We discussed severe leg infection, hypotension, and the fact we cannot dialyze him with this hypotension. She explains to me that she has been trying to care for him at home and that she recognizes the severity of his illness and that he may not live. She says she has been "preparing." She also voices concern that his family in [place] would blame her if he were to die. She requests a letter that I will help with to describe that we are doing everything to help Travis [name] and to help release her from any blame.   We spoke more about resuscitation and the limited benefits but suffering this would cause him. She at first states that Travis [name]  has desired full efforts but then recognizes this would mean "artifically" keeping him alive and he would not want that. She agrees to DNR and he confirms he would not want tubes. NO BLOOD PRODUCTS d/t [employer] faith.   Travis [name] must leave for work now but plans to return tomorrow ~9-10 am and I will plan to speak more with them then. Will try and arrange for Spanish interpretor at this time tomorrow.   Primary Decision Maker NEXT OF KIN wife    SUMMARY OF RECOMMENDATIONS   - DNR decided today - Will need to continue discussions tomorrow - Per wife he has requested full aggressive care in the past and she worries about being blamed by his family for his death by placing any limitations  Code Status/Advance Care Planning:  DNR   Symptom Management:   Pain: Vicodin prn. Morphine IV prn but would recommend considering fentanyl or dilaudid prn with renal failure.   Poor intake: Unfortunately no good answer for this because d/t decline. I do not believe there is a role for feeding tube here. Patient says no tubes.   Palliative Prophylaxis:  Aspiration, Bowel Regimen, Delirium Protocol, Frequent Pain Assessment, Oral Care and Turn Reposition  Psycho-social/Spiritual:   Desire for further Chaplaincy support:no - requesting to visit with a friend from his church - [employer]  Additional Recommendations: Education on Hospice  Prognosis:   Very poor - likely days to weeks.   Discharge Planning: To Be Determined      Primary Diagnoses: Present on Admission: . Cellulitis of leg   I have reviewed the medical record, interviewed the patient and family, and examined the patient. The following aspects are pertinent.  Past Medical History:  Diagnosis Date  . CHF (congestive heart failure) ([place])   . Chronic kidney disease   . Coronary artery disease   . Diabetes mellitus without complication ([place])   . Dialysis patient ([place])   . Osteomyelitis [facility])    Social  History   Social History  . Marital status: Married    Spouse name: N/A  . Number of children: N/A  . Years of education: N/A   Social History Main Topics  . Smoking status: Former Research scientist (life sciences)  . Smokeless tobacco: Never Used  . Alcohol use No  . Drug use: No  . Sexual activity: Not Asked   Other Topics Concern  . None   Social History Narrative  . None   No family history on file. Scheduled Meds: . aspirin EC  81 mg Oral Daily  . Chlorhexidine Gluconate Cloth  6 each Topical Q0600  . epoetin (EPOGEN/PROCRIT) injection  10,000 Units Intravenous Q M,W,F-HD  . feeding supplement (ENSURE ENLIVE)  237 mL Oral BID BM  . heparin  5,000 Units Subcutaneous Q8H  . midodrine  10 mg Oral TID WC  . mirtazapine  7.5 mg Oral QHS  . multivitamin  1 tablet Oral QHS  . mupirocin ointment  1 application Nasal BID  . omega-3 acid ethyl esters  1 g Oral BID  . sodium chloride flush  3 mL Intravenous Q12H  . [START ON 09/30/2016] Vitamin D (Ergocalciferol)  50,000 Units Oral Q7 days   Continuous Infusions: . sodium chloride    . sodium chloride    . sodium chloride    . dextrose 40 mL/hr at 09/29/16 1245  . phenylephrine (NEO-SYNEPHRINE) Adult infusion    . piperacillin-tazobactam (ZOSYN)  IV 3.375 g (09/29/16 1036)  . vancomycin     PRN Meds:.sodium chloride, sodium chloride, sodium chloride, acetaminophen **OR** acetaminophen, albuterol, alteplase, heparin, HYDROcodone-acetaminophen, HYDROcodone-acetaminophen, lidocaine (PF), lidocaine-prilocaine, morphine injection, ondansetron **OR** ondansetron (ZOFRAN) IV, pentafluoroprop-tetrafluoroeth, senna-docusate, sodium chloride flush No Known Allergies Review of Systems  Unable to perform ROS: Acuity of condition    Physical Exam  Constitutional: He appears well-developed.  HENT:  Head: Normocephalic and atraumatic.  Cardiovascular: Normal rate.  An irregularly irregular rhythm present.  Pulmonary/Chest: Effort normal. No accessory muscle  usage. No tachypnea. No respiratory distress.  Abdominal: Soft. Normal appearance.  Neurological: He is alert. He is disoriented.  Nursing note and vitals reviewed.   Vital Signs: BP (!) 78/53 (BP Location: Right Arm)   Pulse 83   Temp 97.8 F (36.6 C) (Oral)   Resp 14   Ht _0  (1.676 m)   Wt 85.6 kg (188 lb 11.4 oz)   SpO2 96%   BMI 30.46 kg/m  Pain Assessment: No/denies pain POSS *See Group Information*: S-Acceptable,Sleep, easy to arouse Pain Score: Asleep   SpO2: SpO2: 96 % O2 Device:SpO2: 96 % O2 Flow Rate: .O2 Flow Rate (L/min): 2 L/min  IO: Intake/output summary:  Intake/Output Summary (Last 24 hours) at 09/29/16 1309 Last data filed at 09/29/16 1245  Gross per 24 hour  Intake             2118 ml  Output                0 ml  Net             2118 ml    LBM: Last BM Date: 09/26/16 Baseline Weight: Weight: 80 kg (176 lb 5.9 oz) Most recent weight: Weight: 85.6 kg (188 lb 11.4 oz)     Palliative Assessment/Data: 20%    Time Total: 70 min  Greater than 50%  of this time was spent counseling and coordinating care related to the above assessment and plan.  Signed by: Vinie Sill, NP Palliative Medicine Team Pager # [phone] (M-F 8a-5p) Team Phone # [phone] (Nights/Weekends)

## 2016-09-29 NOTE — Progress Notes (Signed)
Pharmacy Antibiotic Note  Travis [name] is a 66 y.o. male admitted on Oct 22, 2016 with cellulitis.  Pharmacy has been consulted for vancomycin and piperacillin/tazobactam dosing. Patient received loading dose of 1750 mg on 9/19. Pt has ESRD and is on HD, receives HD MWF. Vancomycin random 21 (9/24 1100). Patient did not receive dialysis today.     Plan: Continue Vancomycin  with HD session.  Goal VT 15-25 mcg/mL Continue piperacillin/tazobactam 3.375 g IV q12h EI.  Height:  (167.6 cm) Weight: 188 lb 11.4 oz (85.6 kg) IBW/kg (Calculated) : 63.8  Temp (24hrs), Avg:98.4 F (36.9 C), Min:98.2 F (36.8 C), Max:98.6 F (37 C)   Recent Labs Lab 2016/10/22 1221 09/25/16 0338 09/25/16 1235 09/25/16 1438 09/26/16 0208 09/27/16 0729 09/28/16 0628 09/29/16 0750  WBC 6.6 9.5  --   --  12.6* 11.3* 8.8 9.5  CREATININE 3.00* 3.87*  --   --  4.67* 3.51* 4.49* 5.36*  LATICACIDVEN 1.3  --  1.6 1.7  --   --   --   --     Estimated Creatinine Clearance: 13.7 mL/min (A) (by C-G formula based on SCr of 5.36 mg/dL (H)).    No Known Allergies  Antimicrobials this admission: Vancomycin 9/19 >>  Piperacillin/tazobactam 9/19 >>  Cefepime dose in ED 9/19  Dose adjustments this admission: N/A   Microbiology results: 9/19 BCx: no growth in 5 days  9/20 Aerobic Cx: rare gram positive cocci, rare gram negative rods , moderate MRSA 9/19 MRSA: positive    Thank you for allowing pharmacy to be a part of this patient's care.  Dwain Sarna, PharmD Student 09/29/2016 11:03 AM

## 2016-09-29 NOTE — Progress Notes (Signed)
Pt is asking nursing to not touch his legs as the dressing changes will be to painful. Pain medication offered and declined. Pt not wanting dressing changed at this time

## 2016-09-29 NOTE — Progress Notes (Signed)
[facility]  ROUNDING NOTE   Subjective:   Patient waxing and waning mental status.   History taken with assistance of Spanish Interpreter.   Scheduled for hemodialysis today.   Hypotensive this morning. Placed on phenylephrine.   Objective:  Vital signs in last 24 hours:  Temp:  [97.8 F (36.6 C)-98.6 F (37 C)] 97.8 F (36.6 C) (09/24 1245) Pulse Rate:  [75-101] 83 (09/24 1245) Resp:  [14-24] 14 (09/24 1245) BP: (66-100)/(38-68) 78/53 (09/24 1245) SpO2:  [87 %-98 %] 96 % (09/24 1245) Weight:  [85.6 kg (188 lb 11.4 oz)] 85.6 kg (188 lb 11.4 oz) (09/24 0500)  Weight change: -0.4 kg (-14.1 oz) Filed Weights   09/27/16 0500 09/28/16 0500 09/29/16 0500  Weight: 83 kg (182 lb 15.7 oz) 86 kg (189 lb 9.5 oz) 85.6 kg (188 lb 11.4 oz)    Intake/Output: I/O last 3 completed shifts: In: 1253 [P.O.:80; I.V.:1123; IV Piggyback:50] Out: -    Intake/Output this shift:  Total I/O In: 1368 [I.V.:1218; IV Piggyback:150] Out: -   Physical Exam: General: No acute distress  Head: Normocephalic, atraumatic. Moist oral mucosal membranes  Eyes: Anicteric  Neck: Supple, trachea midline  Lungs:  Clear to auscultation, normal effort  Heart: S1S2 no rubs  Abdomen:  Soft, nontender, bowel sounds present  Extremities: Bilateral lower extremeties wrapped, 1+ bilateral LE edema.  Neurologic: Awake, alert, following commands  Skin: No rashes  Access: LUE AV access    Basic Metabolic Panel:  Recent Labs Lab 09/29/2016 1221 09/25/16 0338 09/26/16 0208 09/27/16 0729 09/28/16 0628 09/29/16 0750 09/29/16 1059  NA 133* 131* 129* 131* 132* 130*  --   K 2.6* 3.5 3.5 3.3* 3.6 4.7  --   CL 98* 97* 94* 98* 95* 95*  --   CO2 --   GLUCOSE 37* 61* 133* 128* 119* 104*  --   BUN 15 22* 31* 23* 33* 44*  --   CREATININE 3.00* 3.87* 4.67* 3.51* 4.49* 5.36*  --   CALCIUM 8.5* 8.0* 8.5* 8.5* 8.8* 8.7*  --   MG 1.8  --   --  1.9  --   --   --   PHOS  --   --   --   3.7  --   --  5.3*    Liver Function Tests:  Recent Labs Lab 09/07/2016 1221 09/25/16 0338  AST 45*  --   ALT 20  --   ALKPHOS 124  --   BILITOT 1.9* 2.5*  PROT 7.2  --   ALBUMIN 2.3*  --    No results for input(s): LIPASE, AMYLASE in the last 168 hours. No results for input(s): AMMONIA in the last 168 hours.  CBC:  Recent Labs Lab 09/07/2016 1221 09/25/16 0338 09/26/16 0208 09/27/16 0729 09/28/16 0628 09/29/16 0750  WBC 6.6 9.5 12.6* 11.3* 8.8 9.5  NEUTROABS 4.7  --   --   --   --   --   HGB 10.0* 9.5* 9.5* 9.3* 9.2* 9.9*  HCT 28.9* 27.3* 28.6* 27.3* 27.0* 28.3*  MCV 100.2* 101.3* 100.3* 102.0* 100.5* 101.3*  PLT 189 151 168 138* 159 141*    Cardiac Enzymes:  Recent Labs Lab 09/25/16 2138 09/26/16 0208 09/26/16 2135 09/27/16 0729 09/27/16 1345  TROPONINI 1.27* 1.38* 1.40* 1.27* 1.12*    BNP: Invalid input(s): POCBNP  CBG:  Recent Labs Lab 09/28/16 1653 09/28/16 1945 09/29/16 0001 09/29/16 0453 09/29/16 0731  GLUCAP 117* 120*  112* 102* 109*    Microbiology: Results for orders placed or performed during the hospital encounter of 09/23/2016  Blood Culture (routine x 2)     Status: None   Collection Time: 10/01/2016 12:21 PM  Result Value Ref Range Status   Specimen Description BLOOD RIGHT [facility]  Final   Special Requests   Final    BOTTLES DRAWN AEROBIC AND ANAEROBIC Blood Culture adequate volume   Culture NO GROWTH 5 DAYS  Final   Report Status 09/29/2016 FINAL  Final  Blood Culture (routine x 2)     Status: None   Collection Time: 09/07/2016 12:21 PM  Result Value Ref Range Status   Specimen Description BLOOD BLOOD RIGHT HAND  Final   Special Requests   Final    BOTTLES DRAWN AEROBIC AND ANAEROBIC Blood Culture results may not be optimal due to an inadequate volume of blood received in culture bottles   Culture NO GROWTH 5 DAYS  Final   Report Status 09/29/2016 FINAL  Final  Surgical PCR screen     Status: Abnormal   Collection Time: 09/21/2016  6:31  PM  Result Value Ref Range Status   MRSA, PCR POSITIVE (A) NEGATIVE Final    Comment: RESULT CALLED TO, READ BACK BY AND VERIFIED WITH: CRYSTAL BASS AT 2041 09/19/2016 BY TFK    Staphylococcus aureus POSITIVE (A) NEGATIVE Final    Comment: (NOTE) The Xpert SA Assay (FDA approved for NASAL specimens in patients 33 years of age and older), is one component of a comprehensive surveillance program. It is not intended to diagnose infection nor to guide or monitor treatment.   Aerobic Culture (superficial specimen)     Status: None   Collection Time: 09/25/16  2:30 PM  Result Value Ref Range Status   Specimen Description FOOT  Final   Special Requests Normal  Final   Gram Stain   Final    RARE WBC PRESENT,BOTH PMN AND MONONUCLEAR RARE GRAM POSITIVE COCCI RARE GRAM NEGATIVE RODS    Culture   Final    MODERATE METHICILLIN RESISTANT STAPHYLOCOCCUS AUREUS WITHIN MIXED CULTURE Performed at [facility], [address], [place], [place] [address]    Report Status 09/29/2016 FINAL  Final   Organism ID, Bacteria METHICILLIN RESISTANT STAPHYLOCOCCUS AUREUS  Final      Susceptibility   Methicillin resistant staphylococcus aureus - MIC*    CIPROFLOXACIN >=8 RESISTANT Resistant     ERYTHROMYCIN >=8 RESISTANT Resistant     GENTAMICIN <=0.5 SENSITIVE Sensitive     OXACILLIN >=4 RESISTANT Resistant     TETRACYCLINE <=1 SENSITIVE Sensitive     VANCOMYCIN <=0.5 SENSITIVE Sensitive     TRIMETH/SULFA <=10 SENSITIVE Sensitive     CLINDAMYCIN RESISTANT Resistant     RIFAMPIN <=0.5 SENSITIVE Sensitive     Inducible Clindamycin POSITIVE Resistant     * MODERATE METHICILLIN RESISTANT STAPHYLOCOCCUS AUREUS    Coagulation Studies: No results for input(s): LABPROT, INR in the last 72 hours.  Urinalysis: No results for input(s): COLORURINE, LABSPEC, PHURINE, GLUCOSEU, HGBUR, BILIRUBINUR, KETONESUR, PROTEINUR, UROBILINOGEN, NITRITE, LEUKOCYTESUR in the last 72 hours.  Invalid input(s):  APPERANCEUR    Imaging: No results found.   Medications:   . sodium chloride    . sodium chloride    . sodium chloride    . dextrose 40 mL/hr at 09/29/16 1245  . phenylephrine (NEO-SYNEPHRINE) Adult infusion    . piperacillin-tazobactam (ZOSYN)  IV 3.375 g (09/29/16 1036)  . vancomycin     .  aspirin EC  81 mg Oral Daily  . Chlorhexidine Gluconate Cloth  6 each Topical Q0600  . epoetin (EPOGEN/PROCRIT) injection  10,000 Units Intravenous Q M,W,F-HD  . feeding supplement (ENSURE ENLIVE)  237 mL Oral BID BM  . heparin  5,000 Units Subcutaneous Q8H  . midodrine  10 mg Oral TID WC  . mirtazapine  7.5 mg Oral QHS  . multivitamin  1 tablet Oral QHS  . mupirocin ointment  1 application Nasal BID  . omega-3 acid ethyl esters  1 g Oral BID  . sodium chloride flush  3 mL Intravenous Q12H  . [START ON 09/30/2016] Vitamin D (Ergocalciferol)  50,000 Units Oral Q7 days   sodium chloride, sodium chloride, sodium chloride, acetaminophen **OR** acetaminophen, albuterol, alteplase, heparin, HYDROcodone-acetaminophen, HYDROcodone-acetaminophen, lidocaine (PF), lidocaine-prilocaine, morphine injection, ondansetron **OR** ondansetron (ZOFRAN) IV, pentafluoroprop-tetrafluoroeth, senna-docusate, sodium chloride flush  Assessment/ Plan:  66 y.o. male with diabetes, hypertension, coronary artery disease s/p CABG 2011, anemia of CKD, SHPTH, who presents now with b/l LE ulcerations  MWF [facility] [address]   1. End Stage Renal Disease: Hold dialysis today due to worsening blood pressures.  Monitor for dialysis daily.   2. Hypotension. Now on midodrine  po tid,  Restarted on phenylephrine  3. Anemia of chronic kidney disease: hemoglobin 9.9 - EPO with HD treatment  4. Secondary Hyperparathyroidism: currently not on binders.   Overall prognosis poor. Discussed case with palliative care. Patient now DNR.   LOS: 5 Lurlie Wigen 9/24/20181:00 PM

## 2016-09-29 NOTE — Progress Notes (Signed)
[employer] - [place] at [facility]   PATIENT NAME: Travis [name]    MR#:  [id number]  DATE OF BIRTH:  1950-06-22  SUBJECTIVE:   Patient denies chest pain or shortness of breath, but has generalized weakness. Back on Neo drip due to hypotension.  REVIEW OF SYSTEMS:    Review of Systems  Constitutional: He has generalized weakness  HENT: Negative for ear pain, nosebleeds, congestion, facial swelling, rhinorrhea, neck pain, neck stiffness and ear discharge.   Respiratory: Negative for cough, shortness of breath, wheezing  Cardiovascular: Negative for chest pain, palpitations and leg swelling.  Gastrointestinal: Negative for heartburn, abdominal pain, ositos nausea and vomiting no diarrhea or constipation Genitourinary: Negative for dysuria, urgency, frequency, hematuria Musculoskeletal: Negative for back pain or joint pain Neurological: Negative for dizziness, seizures, syncope, focal weakness,  numbness and headaches.  Hematological: Does  bruise/bleed easily.  Psychiatric/Behavioral: Negative for hallucinations, confusion, dysphoric mood SKIN: b/l wound infections   Tolerating Diet:  yes    DRUG ALLERGIES:  No Known Allergies  VITALS:  Blood pressure (!) 76/61, pulse 85, temperature 98.3 F (36.8 C), temperature source Oral, resp. rate 15, height  (1.676 m), weight 188 lb 11.4 oz (85.6 kg), SpO2 99 %.  PHYSICAL EXAMINATION:  Constitutional: Appears Chronically ill appearing HENT: Normocephalic. Marland Kitchen Oropharynx is clear and moist.  Eyes: Conjunctivae and EOM are normal. PERRLA, no scleral icterus.  Neck: Normal ROM. Neck supple. No JVD. No tracheal deviation. CVS: Irregular, irregular S1/S2 +, no murmurs, no gallops, no carotid bruit.  Pulmonary: Effort and breath sounds normal, no stridor, rhonchi, wheezes, rales.  Abdominal: Soft. BS +,  no distension, tenderness, rebound or guarding.  Musculoskeletal: He is able to move all extremities.  Neuro: Alert.  CN 2-12 grossly intact. No focal deficits. Skin: b/l LE ulcerations That are wrapped and foul-smelling  Psychiatric: Depressed  mood and affect.      LABORATORY PANEL:   CBC  Recent Labs Lab 09/29/16 0750  WBC 9.5  HGB 9.9*  HCT 28.3*  PLT 141*   ------------------------------------------------------------------------------------------------------------------  Chemistries   Recent Labs Lab 10-13-2016 1221 09/25/16 0338  09/27/16 0729  09/29/16 0750  NA 133* 131*  < > 131*  < > 130*  K 2.6* 3.5  < > 3.3*  < > 4.7  CL 98* 97*  < > 98*  < > 95*  CO2 27 24  < > 24  < > 23  GLUCOSE 37* 61*  < > 128*  < > 104*  BUN 15 22*  < > 23*  < > 44*  CREATININE 3.00* 3.87*  < > 3.51*  < > 5.36*  CALCIUM 8.5* 8.0*  < > 8.5*  < > 8.7*  MG 1.8  --   --  1.9  --   --   AST 45*  --   --   --   --   --   ALT 20  --   --   --   --   --   ALKPHOS 124  --   --   --   --   --   BILITOT 1.9* 2.5*  --   --   --   --   < > = values in this interval not displayed. ------------------------------------------------------------------------------------------------------------------  Cardiac Enzymes  Recent Labs Lab 09/26/16 2135 09/27/16 0729 09/27/16 1345  TROPONINI 1.40* 1.27* 1.12*   ------------------------------------------------------------------------------------------------------------------  RADIOLOGY:  Dg Chest 1 View  Result Date: 09/29/2016 CLINICAL DATA:  Status post central line placement. History of CHF, coronary artery disease, and diabetes. EXAM: CHEST 1 VIEW COMPARISON:  Chest x-ray of September 25, 2016 FINDINGS: The patient has undergone placement of a right internal jugular venous catheter. The tip projects over the junction of the middle and distal thirds of the SVC. There is no postprocedure pneumothorax. The interstitial markings of both lungs are coarse but not greatly changed since this earlier study allowing for differences in positioning. The cardiac silhouette is  enlarged. The retrocardiac region remains dense. The central pulmonary vascularity is prominent. There is calcification in the wall of the aortic arch. The patient has undergone previous CABG. IMPRESSION: There is no postprocedure pneumothorax since placement of the right internal jugular venous catheter. Persistent mild CHF. Thoracic aortic atherosclerosis. Electronically Signed   By: David  Swaziland M.D.   On: 09/29/2016 16:35     ASSESSMENT AND PLAN:   66 y.o. male CAD s/p CABG, hx of STEMI, T2DM, ESRD on HD, PVD w/ a hx of bilateral toe and transmetatarsal amputation and chronic non-healing LE wounds. He is followed in [facility] wound clinic complicated by medical non-compliance presented to [facility] ED upon referral from hemodialysis for hypoglycemia and B/L lower extremity wounds.   1. Septic shock with hypotension, tachycardia and fevers from bilateral LE wounds Restart Neo drip. Continue broad-spectrum antibiotics Blood cultures remain negative.  2. Bilateral lower extremity wounds. Patient is followed at [facility] wound clinic. He has extensive eschar on all wounds. Patient has been seen and evaluated by surgery as well as podiatry and vascular surgery.  Patient is high risk for above-knee amputations and does not desire above-knee amputations. As per vascular surgery no role for debridement for local procedures for angiogram at this point. If at any point the patient desires to have amputation and vascular surgery can perform it here or patient may follow-up with his vascular surgeon at [facility] and wound care physicians at [facility].   Management as per wound care as follows: To posterior right lower leg and anterior and posterior left lower leg wounds, cleanse wounds with NS, pat dry, apply large Xeroform, cover  wounds with ABDs, wrap with kerlix, adhere kerlix to kerlix, no tape on skin, perform daily and prn soiling. Prevalon boots to prevent foot ulcers. Foam dressing to sacral ulcer, change Q3D and prn  soiling. Peel back and assess Q shift.  Continue vancomycin and Zosyn ID consultation pending.  Wound culture showing  PMNs,  gram-positive cocci and gram-negative rods  3. Hypoglycemia: Remains on  D10 drip due to poor by mouth intake  4. ESRD on hemodialysis: He will continue dialysis as scheduled by nephrology. Could consider adding Midorine for low BP prior to HD days  5. Chronic diastolic heart failure with preserved ejection fraction/chronic ischemic heart disease with history of CABG: No signs of acute exacerbation at this time  6. Hypokalemia:  Improved. Replete when necessary  7. Hyponatremia: Relatively stable  8. Anemia of chronic disease: Hemoglobin remained stable Will need Epogen during dialysis Hemoglobin range stable  9. Elevated bilirubin: Abdominal ultrasound without evidence of acute abdominal pathology.  10. Peripheral vascular disease: Continue aspirin and Plavix  11. Essential hypertension due to severe sepsis with hypotension blood pressure medications discontinued for now  12. Chronic atrial fibrillation:CHADS2VASC score 4 As per his cardiologist he is not a candidate for anticoagulation given his renal disease and noncompliance with increased risk of high bleeding and falls   13. Elevated troponin in the setting of septic shock  and poor renal clearance: Discussed with intensivist he did not feel cardiology consultation was needed.  14. Nausea and vomiting: Right upper quadrant ultrasound shows sludge and KUB shows no acute abnormality This has resolved  15. Depression: Patient was evaluated by psychiatry. Remeron 7.5 mg added to his regimen, more for appetite then depression.  16. Moderate malnutrition: Continue nutritional supplements and omega-3 for wound healing.   Patient has very poor overall prognosis.  CODE STATUS is changed to DO NOT RESUSCITATE after palliative care consultation. Management plans discussed with the patient and he is in  agreement.  CODE STATUS: DNR.   TOTAL TIME TAKING CARE OF THIS PATIENT: 24 minutes.   POSSIBLE D/C ??, DEPENDING ON CLINICAL CONDITION.  Discussed with intensivist Shaune Pollack M.D on 09/29/2016 at 4:54 PM  Between 7am to 6pm - Pager - [phone] After 6pm go to [email or web address] - Social research officer, government  [facility]  Office  [phone]  CC: Primary care physician; Center, [facility]  Note: This dictation was prepared with Nurse, children's dictation along with smaller phrase technology. Any transcriptional errors that result from this process are unintentional.

## 2016-09-29 NOTE — Progress Notes (Signed)
After conferring with the RNs and the MD in the ICU it was decided that a PICC was not in the best interest for this pt.  There is a PIV in the right anterior forearm with Neo and Zosyn infusing that has infiltrated.  The right arm is swollen tight from the hand up to the upper arm.   I stopped the IVFs and unplugged the lines from the IV catheter.   I suggested an IJ line insertion due to he gets dialysis in the left arm.   They don't have any where to obtain a BP because both of his legs have multiple wounds on them.   He cries out in pain when he is touched on his legs.  The pharmacist has ordered Regitine for treatment of the right arm infiltrate of Neo.    MD has decided to insert an IJ.

## 2016-09-29 NOTE — Progress Notes (Signed)
Dr.Kolluru called and notified of pt now being a DNR. Verbal orders given to hold Vancomycin and EPOETIN due to patient not receiving dialysis today. Will continue to assess.

## 2016-09-29 NOTE — Procedures (Signed)
Central Venous Catheter Placement: Indication: Patient receiving vesicant or irritant drug.; Patient receiving intravenous therapy for longer than 5 days.; Patient has limited or no vascular access.   Consent:emergent    Hand washing performed prior to starting the procedure.   Procedure:   An active timeout was performed and correct patient, name, & ID confirmed.   Patient was positioned correctly for central venous access.  Patient was prepped using strict sterile technique including chlorohexadine preps, sterile drape, sterile gown and sterile gloves.    The area was prepped, draped and anesthetized in the usual sterile manner. Patient comfort was obtained.    A triple lumen catheter was placed in RT  Internal Jugular Vein There was good blood return, catheter caps were placed on lumens, catheter flushed easily, the line was secured and a sterile dressing and BIO-PATCH applied.   Ultrasound was used to visualize vasculature and guidance of needle.   Number of Attempts: 1 Complications:none Estimated Blood Loss: none Chest Radiograph indicated and ordered.  Operator: Sisto Granillo.   Korene Dula David Owen Pratte, M.D.  [facility]  Medical Director ICU-[facility] [place] Medical Director [facility] Cardio-Pulmonary Department     

## 2016-09-30 DIAGNOSIS — I739 Peripheral vascular disease, unspecified: Secondary | ICD-10-CM

## 2016-09-30 DIAGNOSIS — L97903 Non-pressure chronic ulcer of unspecified part of unspecified lower leg with necrosis of muscle: Secondary | ICD-10-CM

## 2016-09-30 LAB — PHOSPHORUS: PHOSPHORUS: 5.9 mg/dL — AB (ref 2.5–4.6)

## 2016-09-30 LAB — GLUCOSE, CAPILLARY
GLUCOSE-CAPILLARY: 101 mg/dL — AB (ref 65–99)
GLUCOSE-CAPILLARY: 93 mg/dL (ref 65–99)
GLUCOSE-CAPILLARY: 93 mg/dL (ref 65–99)
Glucose-Capillary: 103 mg/dL — ABNORMAL HIGH (ref 65–99)
Glucose-Capillary: 94 mg/dL (ref 65–99)
Glucose-Capillary: 97 mg/dL (ref 65–99)

## 2016-09-30 LAB — PARATHYROID HORMONE, INTACT (NO CA): PTH: 34 pg/mL (ref 15–65)

## 2016-09-30 MED ORDER — DRONABINOL 2.5 MG PO CAPS
5.0000 mg | ORAL_CAPSULE | Freq: Two times a day (BID) | ORAL | Status: DC
  Administered 2016-09-30: 5 mg via ORAL
  Filled 2016-09-30 (×2): qty 2

## 2016-09-30 MED ORDER — FENTANYL CITRATE (PF) 100 MCG/2ML IJ SOLN: 25.0000 ug | INTRAMUSCULAR | Status: DC | PRN

## 2016-09-30 MED ORDER — FENTANYL 12 MCG/HR TD PT72
12.5000 ug | MEDICATED_PATCH | TRANSDERMAL | Status: DC
  Administered 2016-09-30 – 2016-10-03 (×2): 12.5 ug via TRANSDERMAL
  Filled 2016-09-30 (×2): qty 1

## 2016-09-30 MED ORDER — LORAZEPAM 2 MG/ML IJ SOLN
1.0000 mg | Freq: Two times a day (BID) | INTRAMUSCULAR | Status: DC | PRN
  Administered 2016-09-30: 1 mg via INTRAVENOUS
  Filled 2016-09-30: qty 1

## 2016-09-30 MED ORDER — ORAL CARE MOUTH RINSE
15.0000 mL | Freq: Two times a day (BID) | OROMUCOSAL | Status: DC
  Administered 2016-09-30 – 2016-10-02 (×3): 15 mL via OROMUCOSAL

## 2016-09-30 MED ORDER — FENTANYL CITRATE (PF) 100 MCG/2ML IJ SOLN: 50.0000 ug | INTRAMUSCULAR | Status: DC | PRN

## 2016-09-30 NOTE — Progress Notes (Signed)
[employer] - [place] [facility]   PATIENT NAME: Travis [name]    MR#:  [id number]  DATE OF BIRTH:  [birth year]  SUBJECTIVE:   Generalized body pain and weakness. On Neo drip due to hypotension.  REVIEW OF SYSTEMS:    Review of Systems  Constitutional: He has generalized weakness  HENT: Negative for ear pain, nosebleeds, congestion, facial swelling, rhinorrhea, neck pain, neck stiffness and ear discharge.   Respiratory: Negative for cough, shortness of breath, wheezing  Cardiovascular: Negative for chest pain, palpitations and leg swelling.  Gastrointestinal: Negative for heartburn, abdominal pain, ositos nausea and vomiting no diarrhea or constipation Genitourinary: Negative for dysuria, urgency, frequency, hematuria Musculoskeletal: Negative for back pain or joint pain Neurological: Negative for dizziness, seizures, syncope, focal weakness,  numbness and headaches.  Hematological: Does  bruise/bleed easily.  Psychiatric/Behavioral: Negative for hallucinations, confusion, dysphoric mood SKIN: b/l wound infections   Tolerating Diet:  yes DRUG ALLERGIES:  No Known Allergies  VITALS:  Blood pressure 101/63, pulse 79, temperature 97.8 F (36.6 C), temperature source Axillary, resp. rate (!) 22, height  (1.676 m), weight 189 lb 13.1 oz (86.1 kg), SpO2 94 %.  PHYSICAL EXAMINATION:  Constitutional: Appears Chronically ill appearing HENT: Normocephalic. Marland Kitchen Oropharynx is clear and moist.  Eyes: Conjunctivae and EOM are normal. PERRLA, no scleral icterus.  Neck: Normal ROM. Neck supple. No JVD. No tracheal deviation. CVS: Irregular, irregular S1/S2 +, no murmurs, no gallops, no carotid bruit.  Pulmonary: Effort and breath sounds normal, no stridor, rhonchi, wheezes, rales.  Abdominal: Soft. BS +,  no distension, tenderness, rebound or guarding.  Musculoskeletal: He is able to move all extremities.  Neuro: Alert. CN 2-12 grossly intact. No focal deficits. Skin:  b/l LE ulcerations That are wrapped and foul-smelling  Psychiatric: Depressed  mood and affect.  LABORATORY PANEL:   CBC  Recent Labs Lab 09/29/16 0750  WBC 9.5  HGB 9.9*  HCT 28.3*  PLT 141*   ------------------------------------------------------------------------------------------------------------------  Chemistries   Recent Labs Lab 10/01/2016 1221 09/25/16 0338  09/27/16 0729  09/29/16 0750  NA 133* 131*  < > 131*  < > 130*  K 2.6* 3.5  < > 3.3*  < > 4.7  CL 98* 97*  < > 98*  < > 95*  CO2 27 24  < > 24  < > 23  GLUCOSE 37* 61*  < > 128*  < > 104*  BUN 15 22*  < > 23*  < > 44*  CREATININE 3.00* 3.87*  < > 3.51*  < > 5.36*  CALCIUM 8.5* 8.0*  < > 8.5*  < > 8.7*  MG 1.8  --   --  1.9  --   --   AST 45*  --   --   --   --   --   ALT 20  --   --   --   --   --   ALKPHOS 124  --   --   --   --   --   BILITOT 1.9* 2.5*  --   --   --   --   < > = values in this interval not displayed. ------------------------------------------------------------------------------------------------------------------  Cardiac Enzymes  Recent Labs Lab 09/26/16 2135 09/27/16 0729 09/27/16 1345  TROPONINI 1.40* 1.27* 1.12*   ------------------------------------------------------------------------------------------------------------------  RADIOLOGY:  Dg Chest 1 View  Result Date: 09/29/2016 CLINICAL DATA:  Status post central line placement. History of CHF, coronary artery disease, and diabetes. EXAM: CHEST  1 VIEW COMPARISON:  Chest x-ray of September 25, 2016 FINDINGS: The patient has undergone placement of a right internal jugular venous catheter. The tip projects over the junction of the middle and distal thirds of the SVC. There is no postprocedure pneumothorax. The interstitial markings of both lungs are coarse but not greatly changed since this earlier study allowing for differences in positioning. The cardiac silhouette is enlarged. The retrocardiac region remains dense. The central  pulmonary vascularity is prominent. There is calcification in the wall of the aortic arch. The patient has undergone previous CABG. IMPRESSION: There is no postprocedure pneumothorax since placement of the right internal jugular venous catheter. Persistent mild CHF. Thoracic aortic atherosclerosis. Electronically Signed   By: David  Swaziland M.D.   On: 09/29/2016 16:35     ASSESSMENT AND PLAN:   66 y.o. male CAD s/p CABG, hx of STEMI, T2DM, ESRD on HD, PVD w/ a hx of bilateral toe and transmetatarsal amputation and chronic non-healing LE wounds. He is followed in [facility] wound clinic complicated by medical non-compliance presented to [facility] ED upon referral from hemodialysis for hypoglycemia and B/L lower extremity wounds.   1. Septic shock with hypotension, tachycardia and fevers from bilateral LE wounds Restarted Neo drip. Continue broad-spectrum antibiotics Blood cultures remain negative. Started on  midodrine.  2. Bilateral lower extremity wounds. Patient is followed at [facility] wound clinic. He has extensive eschar on all wounds. Patient has been seen and evaluated by surgery as well as podiatry and vascular surgery.  Patient is high risk for above-knee amputations and does not desire above-knee amputations. As per vascular surgery no role for debridement for local procedures for angiogram at this point. If at any point the patient desires to have amputation and vascular surgery can perform it here or patient may follow-up with his vascular surgeon at [facility] and wound care physicians at [facility].   Management as per wound care as follows: To posterior right lower leg and anterior and posterior left lower leg wounds, cleanse wounds with NS, pat dry, apply large Xeroform, cover  wounds with ABDs, wrap with kerlix, adhere kerlix to kerlix, no tape on skin, perform daily and prn soiling. Prevalon boots to prevent foot ulcers. Foam dressing to sacral ulcer, change Q3D and prn soiling. Peel back and assess Q  shift.  Continue vancomycin and Zosyn ID consultation pending.  Wound culture showing  PMNs,  gram-positive cocci and gram-negative rods  3. Hypoglycemia: Remains on  D10 drip due to poor by mouth intake  4. ESRD on hemodialysis:  He was on HD but would not tolerate intermittent hemodialysis treatment due to hypotension.  5. Chronic diastolic heart failure with preserved ejection fraction/chronic ischemic heart disease with history of CABG: No signs of acute exacerbation at this time  6. Hypokalemia:  Improved. Replete when necessary  7. Hyponatremia: Relatively stable  8. Anemia of chronic disease: Hemoglobin remained stable Will need Epogen during dialysis Hemoglobin range stable  9. Elevated bilirubin: Abdominal ultrasound without evidence of acute abdominal pathology.  10. Peripheral vascular disease: Continue aspirin and Plavix  11. Essential hypertension due to severe sepsis with hypotension blood pressure medications discontinued for now  12. Chronic atrial fibrillation:CHADS2VASC score 4 As per his cardiologist he is not a candidate for anticoagulation given his renal disease and noncompliance with increased risk of high bleeding and falls   13. Elevated troponin in the setting of septic shock and poor renal clearance: Discussed with intensivist he did not feel cardiology consultation was needed.  14. Nausea and vomiting: Right upper quadrant ultrasound shows sludge and KUB shows no acute abnormality This has resolved  15. Depression: Patient was evaluated by psychiatry. Remeron 7.5 mg added to his regimen, more for appetite then depression.  16. Moderate malnutrition: Continue nutritional supplements and omega-3 for wound healing.   Patient has very poor overall prognosis.  CODE STATUS is changed to DO NOT RESUSCITATE after palliative care consultation. May move to comfort care in 2 days. Management plans discussed with the patient and he is in  agreement.  CODE STATUS: DNR.   TOTAL TIME TAKING CARE OF THIS PATIENT: 36 minutes.   POSSIBLE D/C ??, DEPENDING ON CLINICAL CONDITION.  Discussed with intensivist Shaune Pollack M.D on 09/30/2016 at 3:41 PM  Between 7am to 6pm - Pager - [phone] After 6pm go to [email or web address] - Social research officer, government  [facility] [place] [facility]  Office  [phone]  CC: Primary care physician; [facility]  Note: This dictation was prepared with Nurse, children's dictation along with smaller phrase technology. Any transcriptional errors that result from this process are unintentional.

## 2016-09-30 NOTE — Care Management (Signed)
Notified by Marchelle Folks patient liaison with dialysis that patient's wife has picked up his wheelchair at dialysis center.

## 2016-09-30 NOTE — Care Management (Addendum)
Patient is requesting a visit from his pastor from [place]- [name]. I am waiting on a callback from him.  

## 2016-09-30 NOTE — Progress Notes (Signed)
[facility] Critical Care Medicine Progess Note    SYNOPSIS   66 y o with ESRD, PVD, gangrenous foot ulcers, s/p septic shock-resolving  ASSESSMENT/PLAN   Severe Septic Shock with hypotension likely secondary to bilateral lower extremity wounds-- on vasopressors Anemia without acute blood loss ESRD on hemodialysis Hypoglycemia.   Mildly elevated troponin likely secondary to demand ischemia Atrial Fibrillation-rate controlled  P:  Continue abx ID, Vascular, Nephrology, General Surgery, and Podiatry consulted appreciate input  Continuous telemetry monitoring  Prn norco for pain management  Subq heparin for VTE prophylaxis  --Prognosis appears poor, pt can not go to floor due to persistent low BP.  Started on  midodrine. Palliative care consulted, DNR/DNI    Micro/culture results: MRSA PCR positive.  BCx2 NTD UC -- Sputum--  Antibiotics: Zosyn 9/19>>  MAJOR EVENTS/TEST RESULTS:   Best Practices  DVT Prophylaxis: heparin GI Prophylaxis: --   ---------------------------------------   ----------------------------------------   Name: Travis [name] MRN: [id number] DOB: December 29, 1950    ADMISSION DATE:  09/19/2016  SUBJECTIVE:   No new complaints today, pt wants to go home.  No acute distress No pain On vasopressors    Review of Systems:  SEE HPI  VITAL SIGNS: Temp:  [97.8 F (36.6 C)-99 F (37.2 C)] 97.8 F (36.6 C) (09/25 0800) Pulse Rate:  [77-101] 81 (09/25 0800) Resp:  [13-24] 17 (09/25 0800) BP: (61-114)/(38-85) 114/60 (09/25 0800) SpO2:  [87 %-100 %] 98 % (09/25 0800) Weight:  [189 lb 13.1 oz (86.1 kg)] 189 lb 13.1 oz (86.1 kg) (09/25 0500)     Physical Examination:   VS: BP 114/60 (BP Location: Right Arm)   Pulse 81   Temp 97.8 F (36.6 C) (Oral)   Resp 17   Ht  (1.676 m)   Wt 189 lb 13.1 oz (86.1 kg)   SpO2 98%   BMI 30.64 kg/m    General Appearance: No distress  Neuro:without focal findings, mental status  normal. HEENT: PERRLA, EOM intact. Pulmonary: normal breath sounds   CardiovascularNormal S1,S2.  No m/r/g.   Abdomen: Benign, Soft, non-tender.   LABORATORY PANEL:   CBC  Recent Labs Lab 09/29/16 0750  WBC 9.5  HGB 9.9*  HCT 28.3*  PLT 141*    Chemistries   Recent Labs Lab 09/19/2016 1221 09/25/16 0338  09/27/16 0729  09/29/16 0750  09/30/16 0208  NA 133* 131*  < > 131*  < > 130*  --   --   K 2.6* 3.5  < > 3.3*  < > 4.7  --   --   CL 98* 97*  < > 98*  < > 95*  --   --   CO2 27 24  < > 24  < > 23  --   --   GLUCOSE 37* 61*  < > 128*  < > 104*  --   --   BUN 15 22*  < > 23*  < > 44*  --   --   CREATININE 3.00* 3.87*  < > 3.51*  < > 5.36*  --   --   CALCIUM 8.5* 8.0*  < > 8.5*  < > 8.7*  --   --   MG 1.8  --   --  1.9  --   --   --   --   PHOS  --   --   --  3.7  --   --   < > 5.9*  AST 45*  --   --   --   --   --   --   --  ALT 20  --   --   --   --   --   --   --   ALKPHOS 124  --   --   --   --   --   --   --   BILITOT 1.9* 2.5*  --   --   --   --   --   --   < > = values in this interval not displayed.   Recent Labs Lab 09/29/16 1250 09/29/16 1557 09/29/16 2025 09/30/16 0017 09/30/16 0518 09/30/16 0720  GLUCAP 92 89 95 94 101* 93   No results for input(s): PHART, PCO2ART, PO2ART in the last 168 hours.  Recent Labs Lab 10/03/2016 1221 09/25/16 0338  AST 45*  --   ALT 20  --   ALKPHOS 124  --   BILITOT 1.9* 2.5*  ALBUMIN 2.3*  --     Cardiac Enzymes  Recent Labs Lab 09/27/16 1345  TROPONINI 1.12*     Shaune Malacara Santiago Glad, M.D.  [facility]  Medical Director [facility] Medical Director [facility] Cardio-Pulmonary Department

## 2016-09-30 NOTE — Progress Notes (Signed)
Daily Progress Note   Patient Name: Travis [name]       Date: 09/30/2016 DOB: 1950/02/05  Age: 66 y.o. MRN#: [id number] Attending Physician: Demetrios Loll, MD Primary Care Physician: [facility] Date: 09/18/2016  Reason for Consultation/Follow-up: Establishing goals of care  Subjective: [name] is still miserable; complaining of pain all over his body.   Length of Stay: 6  Current Medications: Scheduled Meds:  . aspirin EC  81 mg Oral Daily  . Chlorhexidine Gluconate Cloth  6 each Topical Q0600  . dronabinol  5 mg Oral BID AC  . epoetin (EPOGEN/PROCRIT) injection  10,000 Units Intravenous Q M,W,F-HD  . feeding supplement (ENSURE ENLIVE)  237 mL Oral BID BM  . fentaNYL  12.5 mcg Transdermal Q72H  . heparin  5,000 Units Subcutaneous Q8H  . midodrine  10 mg Oral TID WC  . mirtazapine  7.5 mg Oral QHS  . multivitamin  1 tablet Oral QHS  . omega-3 acid ethyl esters  1 g Oral BID  . sodium chloride flush  10-40 mL Intracatheter Q12H  . Vitamin D (Ergocalciferol)  50,000 Units Oral Q7 days    Continuous Infusions: . sodium chloride    . sodium chloride    . dextrose 40 mL/hr at 09/30/16 0900  . phenylephrine (NEO-SYNEPHRINE) Adult infusion 20 mcg/min (09/30/16 0900)  . vancomycin      PRN Meds: sodium chloride, sodium chloride, acetaminophen **OR** acetaminophen, albuterol, alteplase, heparin, HYDROcodone-acetaminophen, HYDROcodone-acetaminophen, lidocaine (PF), lidocaine-prilocaine, LORazepam, morphine injection, ondansetron **OR** ondansetron (ZOFRAN) IV, pentafluoroprop-tetrafluoroeth, senna-docusate, sodium chloride flush  Physical Exam         Constitutional: He appears well-developed.  HENT:  Head: Normocephalic and atraumatic.    Cardiovascular: Normal rate.  An irregularly irregular rhythm present.  Pulmonary/Chest: Effort normal. No accessory muscle usage. No tachypnea. No respiratory distress.  Abdominal: Soft. Normal appearance.  Neurological: He is alert. He is disoriented.  Nursing note and vitals reviewed.  Vital Signs: BP (!) 84/56   Pulse 96   Temp 97.8 F (36.6 C) (Oral)   Resp 13   Ht _0  (1.676 m)   Wt 86.1 kg (189 lb 13.1 oz)   SpO2 100%   BMI 30.64 kg/m  SpO2: SpO2: 100 % O2 Device: O2 Device: Not Delivered O2 Flow Rate: O2  Flow Rate (L/min): 2 L/min  Intake/output summary:  Intake/Output Summary (Last 24 hours) at 09/30/16 1026 Last data filed at 09/30/16 1610  Gross per 24 hour  Intake           1992.8 ml  Output                0 ml  Net           1992.8 ml   LBM: Last BM Date: 09/26/16 Baseline Weight: Weight: 80 kg (176 lb 5.9 oz) Most recent weight: Weight: 86.1 kg (189 lb 13.1 oz)       Palliative Assessment/Data: 20%     Patient Active Problem List   Diagnosis Date Noted  . Wound infection   . Goals of care, counseling/discussion   . Palliative care encounter   . 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 Chronic ulcer of right lower extremity with necrosis of muscle ([place])   . PAD (peripheral artery disease) ([place])   . Pressure injury of skin 09/26/2016  . Depression 09/26/2016  . Anorexia 09/26/2016  . Subacute delirium 09/26/2016  . Cellulitis of leg 09/21/2016  . Unstable angina ([place]) 02/21/2015    Palliative Care Assessment & Plan   HPI: 66 y.o. male  with past medical history of diastolic CHF, CAD, STEMI, ESRD on HD, diabetes mellitus, osteomyelitis, recent transmetatarsal amputation admitted on 10/03/2016 with worsening leg pain with infection and hypoglycemia. Continues with gangrenous foot ulcers and vasopressors. Not eating (per wife was not eating at home either). Prognosis is very poor.   Assessment: I met today again  with wife, [name], and [place] interpretor, [place], and Dr. Juleen China. Long discussion regarding [name] status with infection, hypotension requiring vasopressors, and inability to tolerate dialysis. [name] recognizes that she is at EOL. [name] is tearful but understands that there is nothing left to be done but to keep him comfortable. Will utilize hospice if appropriate. She wishes to continue D10 and phenylephrine until Thursday morning - allowing for anyone who wishes to visit with him. In the meantime will focus care on keeping him comfortable.  She has limited support. She cries as she shares with myself and Bonnita Nasuti that she and her husband have been disappointed as they feel their church has turned their back on them. She says how the past 5-6 months when he has been so sick that they have felt neglected. Emotional support provided to [place].    After discussion [name] shows me a paper that appears to be a document stated he would not wish for his life to be prolonged. This form also lists a healthcare agent as a Daleen Bo. However, I have a few concerns with this form. 1) This is notarized but the notary, healthcare agent, and 1 witness share a last name. 2) This form is only in [place] and [name] is Spanish speaking only. However, we are following the direction on this form and there are not really any further options but comfort. Discussed this with Dr. Domingo Cocking.   Recommendations/Plan:  Pain/dyspnea: Fentanyl patch 12.5 mcg/hr. Fentanyl 50-100 mcg every 2 hours prn.   Goals of Care and Additional Recommendations:  Limitations on Scope of Treatment: Full Comfort Care  Code Status:  DNR  Prognosis:   Hours - Days  Discharge Planning:  Likely hospital death. Hospice facility if appropriate.    Thank you for allowing the Palliative Medicine Team to assist in the care of this patient.  Total Time 60 min Prolonged Time Billed  no       Greater than 50%  of this  time was spent counseling and coordinating care related to the above assessment and plan.  Vinie Sill, NP Palliative Medicine Team Pager # [phone] (M-F 8a-5p) Team Phone # [phone] (Nights/Weekends)

## 2016-09-30 NOTE — Progress Notes (Signed)
Nutrition Follow-up  DOCUMENTATION CODES:   Not applicable  INTERVENTION:  Noted in Dr. Assunta Gambles note plan is now for transition towards comfort care and withdrawal of care on 9/27.  Consider liberalizing diet to regular, though patient does not want any food or drinks.  Will discontinue Ensure Enlive as patient does not want to drink them anymore. Also consider discontinuing Rena-vit and Lovaza 1 gram BID. No further nutrition interventions warranted at this time.  NUTRITION DIAGNOSIS:   Increased nutrient needs related to wound healing, other (see comment) (ESRD on HD) as evidenced by estimated needs.  Ongoing.  GOAL:   Patient will meet greater than or equal to 90% of their needs  Not met. Patient taking 0% of meals and refusing Ensure.  MONITOR:   PO intake, Supplement acceptance, Labs, Weight trends, I & O's, Skin  REASON FOR ASSESSMENT:   Malnutrition Screening Tool    ASSESSMENT:   66 y.o. male with a known history of CHF, CAD, ESRD on HD, DM, osteomyelitis. The patient was sent from hemodialysis center to the ED due to worsening leg infection and hypoglycemia. The patient complains of bilateral leg pain, worsening wound infection and discharge.   -PMT following patient. He had reported he does not want any tubes.  Met with patient at bedside with assistance of Spanish interpreter Marva Panda). No family members present at time of assessment. Patient resting in bed. He reports he has not eaten anything. He does not want to eat. Reports instead he would just like to be at peace. Does not want anymore Ensure (was previously drinking it up until yesterday per chart). He reports he is in a significant amount of pain and eating will make it worse.  Meal Completion: 0%  Medications reviewed and include: Marinol 5 mg BID, Epogen 10000 units every M/W/F with HD, Remeron 7.5 QHS, Rena-vite QHS, Lovaza 1 gram BID, Vitamin D 50000 units, D10 at 40 ml/hr (96 grams of  protein, 326 kcal daily), phenylephrine gtt, vancomycin.  Labs reviewed: CBG 93-103 past 24 hrs, Phosphorus 5.9.  I/O: no UOP  Weight trend: 86.1 kg on 9/25 (+6.1 kg from admission weight)  Discussed with RN. Patient was also discussed on rounds. Plan is to try an appetite stimulant.  Diet Order:  Diet Carb Modified Fluid consistency: Thin; Room service appropriate? Yes  Skin:  Wound (see comment) (Right 1.5 cm x 1.5 cm plantar ulceration with pink granulation tissue and small amount of fibrinous exudate, well-healed TMA; extensive malodorous Left leg wounds with overlying eschar except large and tender posterior calf wound with pink exposed tissue)  Last BM:  09/26/2016 - smear  Height:   Ht Readings from Last 1 Encounters:  09/25/16 _0  (1.676 m)    Weight:   Wt Readings from Last 1 Encounters:  09/30/16 189 lb 13.1 oz (86.1 kg)    Ideal Body Weight:  64.5 kg  BMI:  Body mass index is 30.64 kg/m.  Estimated Nutritional Needs:   Kcal:  1800-2100kcal/day   Protein:  96-112g/day   Fluid:  >1.8L/day   EDUCATION NEEDS:   Education needs addressed  Willey Blade, MS, RD, LDN Pager: [phone] After Hours Pager: [phone]

## 2016-09-30 NOTE — Progress Notes (Signed)
[facility]  ROUNDING NOTE   Subjective:   Family meeting with wife. With Spanish Interpeter.   Patient to be transitioned to comfort care. Tentatively plan on withdrawal of care Thursday morning.   Objective:  Vital signs in last 24 hours:  Temp:  [97.8 F (36.6 C)-99 F (37.2 C)] 97.8 F (36.6 C) (09/25 1200) Pulse Rate:  [57-96] 68 (09/25 1400) Resp:  [12-24] 18 (09/25 1400) BP: (61-114)/(44-85) 110/57 (09/25 1400) SpO2:  [89 %-100 %] 100 % (09/25 1400) Weight:  [86.1 kg (189 lb 13.1 oz)] 86.1 kg (189 lb 13.1 oz) (09/25 0500)  Weight change: 0.5 kg (1 lb 1.6 oz) Filed Weights   09/28/16 0500 09/29/16 0500 09/30/16 0500  Weight: 86 kg (189 lb 9.5 oz) 85.6 kg (188 lb 11.4 oz) 86.1 kg (189 lb 13.1 oz)    Intake/Output: I/O last 3 completed shifts: In: 2927.8 [I.V.:2777.8; IV Piggyback:150] Out: -    Intake/Output this shift:  Total I/O In: 382.5 [I.V.:332.5; IV Piggyback:50] Out: 0   Physical Exam: General: Ill appearing  Head: Normocephalic, atraumatic. Dry oral mucosal membranes  Eyes: Eyes closed  Neck: Supple, trachea midline  Lungs:  Diminished bilaterally   Heart: S1S2 no rubs  Abdomen:  Soft, nontender, bowel sounds present  Extremities: Bilateral lower extremeties wrapped, 1+ bilateral LE edema.  Neurologic: Waxing and waning mental status  Skin: No rashes  Access: Left AVF    Basic Metabolic Panel:  Recent Labs Lab 10/02/16 1221 09/25/16 0338 09/26/16 0208 09/27/16 0729 09/28/16 0628 09/29/16 0750 09/29/16 1059 09/30/16 0208  NA 133* 131* 129* 131* 132* 130*  --   --   K 2.6* 3.5 3.5 3.3* 3.6 4.7  --   --   CL 98* 97* 94* 98* 95* 95*  --   --   CO2 --   --   GLUCOSE 37* 61* 133* 128* 119* 104*  --   --   BUN 15 22* 31* 23* 33* 44*  --   --   CREATININE 3.00* 3.87* 4.67* 3.51* 4.49* 5.36*  --   --   CALCIUM 8.5* 8.0* 8.5* 8.5* 8.8* 8.7*  --   --   MG 1.8  --   --  1.9  --   --   --   --   PHOS  --   --    --  3.7  --   --  5.3* 5.9*    Liver Function Tests:  Recent Labs Lab 2016-10-02 1221 09/25/16 0338  AST 45*  --   ALT 20  --   ALKPHOS 124  --   BILITOT 1.9* 2.5*  PROT 7.2  --   ALBUMIN 2.3*  --    No results for input(s): LIPASE, AMYLASE in the last 168 hours. No results for input(s): AMMONIA in the last 168 hours.  CBC:  Recent Labs Lab Oct 02, 2016 1221 09/25/16 0338 09/26/16 0208 09/27/16 0729 09/28/16 0628 09/29/16 0750  WBC 6.6 9.5 12.6* 11.3* 8.8 9.5  NEUTROABS 4.7  --   --   --   --   --   HGB 10.0* 9.5* 9.5* 9.3* 9.2* 9.9*  HCT 28.9* 27.3* 28.6* 27.3* 27.0* 28.3*  MCV 100.2* 101.3* 100.3* 102.0* 100.5* 101.3*  PLT 189 151 168 138* 159 141*    Cardiac Enzymes:  Recent Labs Lab 09/25/16 2138 09/26/16 0208 09/26/16 2135 09/27/16 0729 09/27/16 1345  TROPONINI 1.27* 1.38* 1.40* 1.27* 1.12*  BNP: Invalid input(s): POCBNP  CBG:  Recent Labs Lab 09/29/16 2025 09/30/16 0017 09/30/16 0518 09/30/16 0720 09/30/16 1126  GLUCAP 95 94 101* 93 103*    Microbiology: Results for orders placed or performed during the hospital encounter of 09/12/2016  Blood Culture (routine x 2)     Status: None   Collection Time: 09/13/2016 12:21 PM  Result Value Ref Range Status   Specimen Description BLOOD RIGHT AC  Final   Special Requests   Final    BOTTLES DRAWN AEROBIC AND ANAEROBIC Blood Culture adequate volume   Culture NO GROWTH 5 DAYS  Final   Report Status 09/29/2016 FINAL  Final  Blood Culture (routine x 2)     Status: None   Collection Time: 09/14/2016 12:21 PM  Result Value Ref Range Status   Specimen Description BLOOD BLOOD RIGHT HAND  Final   Special Requests   Final    BOTTLES DRAWN AEROBIC AND ANAEROBIC Blood Culture results may not be optimal due to an inadequate volume of blood received in culture bottles   Culture NO GROWTH 5 DAYS  Final   Report Status 09/29/2016 FINAL  Final  Surgical PCR screen     Status: Abnormal   Collection Time: 09/07/2016   6:31 PM  Result Value Ref Range Status   MRSA, PCR POSITIVE (A) NEGATIVE Final    Comment: RESULT CALLED TO, READ BACK BY AND VERIFIED WITH: CRYSTAL BASS AT 2041 09/12/2016 BY TFK    Staphylococcus aureus POSITIVE (A) NEGATIVE Final    Comment: (NOTE) The Xpert SA Assay (FDA approved for NASAL specimens in patients 13 years of age and older), is one component of a comprehensive surveillance program. It is not intended to diagnose infection nor to guide or monitor treatment.   Aerobic Culture (superficial specimen)     Status: None   Collection Time: 09/25/16  2:30 PM  Result Value Ref Range Status   Specimen Description FOOT  Final   Special Requests Normal  Final   Gram Stain   Final    RARE WBC PRESENT,BOTH PMN AND MONONUCLEAR RARE GRAM POSITIVE COCCI RARE GRAM NEGATIVE RODS    Culture   Final    MODERATE METHICILLIN RESISTANT STAPHYLOCOCCUS AUREUS WITHIN MIXED CULTURE Performed at [facility], [address], [place], [place] [address]    Report Status 09/29/2016 FINAL  Final   Organism ID, Bacteria METHICILLIN RESISTANT STAPHYLOCOCCUS AUREUS  Final      Susceptibility   Methicillin resistant staphylococcus aureus - MIC*    CIPROFLOXACIN >=8 RESISTANT Resistant     ERYTHROMYCIN >=8 RESISTANT Resistant     GENTAMICIN <=0.5 SENSITIVE Sensitive     OXACILLIN >=4 RESISTANT Resistant     TETRACYCLINE <=1 SENSITIVE Sensitive     VANCOMYCIN <=0.5 SENSITIVE Sensitive     TRIMETH/SULFA <=10 SENSITIVE Sensitive     CLINDAMYCIN RESISTANT Resistant     RIFAMPIN <=0.5 SENSITIVE Sensitive     Inducible Clindamycin POSITIVE Resistant     * MODERATE METHICILLIN RESISTANT STAPHYLOCOCCUS AUREUS    Coagulation Studies: No results for input(s): LABPROT, INR in the last 72 hours.  Urinalysis: No results for input(s): COLORURINE, LABSPEC, PHURINE, GLUCOSEU, HGBUR, BILIRUBINUR, KETONESUR, PROTEINUR, UROBILINOGEN, NITRITE, LEUKOCYTESUR in the last 72 hours.  Invalid input(s):  APPERANCEUR    Imaging: Dg Chest 1 View  Result Date: 09/29/2016 CLINICAL DATA:  Status post central line placement. History of CHF, coronary artery disease, and diabetes. EXAM: CHEST 1 VIEW COMPARISON:  Chest x-ray of  September 25, 2016 FINDINGS: The patient has undergone placement of a right internal jugular venous catheter. The tip projects over the junction of the middle and distal thirds of the SVC. There is no postprocedure pneumothorax. The interstitial markings of both lungs are coarse but not greatly changed since this earlier study allowing for differences in positioning. The cardiac silhouette is enlarged. The retrocardiac region remains dense. The central pulmonary vascularity is prominent. There is calcification in the wall of the aortic arch. The patient has undergone previous CABG. IMPRESSION: There is no postprocedure pneumothorax since placement of the right internal jugular venous catheter. Persistent mild CHF. Thoracic aortic atherosclerosis. Electronically Signed   By: David  [place] M.D.   On: 09/29/2016 16:35     Medications:   . sodium chloride    . sodium chloride    . dextrose 40 mL/hr at 09/30/16 1400  . phenylephrine (NEO-SYNEPHRINE) Adult infusion 20 mcg/min (09/30/16 1400)  . vancomycin     . aspirin EC  81 mg Oral Daily  . Chlorhexidine Gluconate Cloth  6 each Topical Q0600  . dronabinol  5 mg Oral BID AC  . epoetin (EPOGEN/PROCRIT) injection  10,000 Units Intravenous Q M,W,F-HD  . feeding supplement (ENSURE ENLIVE)  237 mL Oral BID BM  . fentaNYL  12.5 mcg Transdermal Q72H  . heparin  5,000 Units Subcutaneous Q8H  . midodrine  10 mg Oral TID WC  . mirtazapine  7.5 mg Oral QHS  . multivitamin  1 tablet Oral QHS  . omega-3 acid ethyl esters  1 g Oral BID  . sodium chloride flush  10-40 mL Intracatheter Q12H  . Vitamin D (Ergocalciferol)  50,000 Units Oral Q7 days   sodium chloride, sodium chloride, acetaminophen **OR** acetaminophen, albuterol, alteplase,  fentaNYL (SUBLIMAZE) injection, heparin, HYDROcodone-acetaminophen, HYDROcodone-acetaminophen, lidocaine (PF), lidocaine-prilocaine, LORazepam, ondansetron **OR** ondansetron (ZOFRAN) IV, pentafluoroprop-tetrafluoroeth, senna-docusate, sodium chloride flush  Assessment/ Plan:  66 y.o. male with diabetes, hypertension, coronary artery disease s/p CABG 2011, anemia of CKD, SHPTH, who presents now with b/l LE ulcerations  MWF [facility] [address]   1. End Stage Renal Disease: Holding dialysis due to hypotension. Currently requiring vasopressors. Criticaly ill.  Most likely patient would not tolerate intermittent hemodialysis treatment. Family wants no aggressive measures. He has already decided to be DNR/DNI.  Last hemodialysis was Friday, 9/21 Appreciate palliative care.   2. Hypotension. With sepsis and vascular disease -  Phenylephrine gtt.   3. Anemia of chronic kidney disease: hemoglobin 9.9  4. Secondary Hyperparathyroidism: currently not on binders.   Overall prognosis poor. Discussed case with palliative care and family with Spanish Interpreter. Plan on moving towards comfort care and plan on withdrawal of care by Thursday 9/27.   LOS: 6 Kadia Abaya 9/25/20182:23 PM

## 2016-10-01 LAB — BASIC METABOLIC PANEL
Anion gap: 10 (ref 5–15)
BUN: 52 mg/dL — AB (ref 6–20)
CHLORIDE: 94 mmol/L — AB (ref 101–111)
CO2: 24 mmol/L (ref 22–32)
Calcium: 8.6 mg/dL — ABNORMAL LOW (ref 8.9–10.3)
Creatinine, Ser: 6.66 mg/dL — ABNORMAL HIGH (ref 0.61–1.24)
GFR calc Af Amer: 9 mL/min — ABNORMAL LOW (ref >60)
GFR calc non Af Amer: 8 mL/min — ABNORMAL LOW (ref >60)
GLUCOSE: 109 mg/dL — AB (ref 65–99)
Potassium: 4.3 mmol/L (ref 3.5–5.1)
SODIUM: 128 mmol/L — AB (ref 135–145)

## 2016-10-01 LAB — GLUCOSE, CAPILLARY
GLUCOSE-CAPILLARY: 127 mg/dL — AB (ref 65–99)
Glucose-Capillary: 101 mg/dL — ABNORMAL HIGH (ref 65–99)
Glucose-Capillary: 103 mg/dL — ABNORMAL HIGH (ref 65–99)
Glucose-Capillary: 123 mg/dL — ABNORMAL HIGH (ref 65–99)
Glucose-Capillary: 91 mg/dL (ref 65–99)
Glucose-Capillary: 94 mg/dL (ref 65–99)

## 2016-10-01 LAB — VANCOMYCIN, RANDOM: Vancomycin Rm: 18

## 2016-10-01 MED ORDER — HYDROCORTISONE NA SUCCINATE PF 100 MG IJ SOLR
50.0000 mg | Freq: Three times a day (TID) | INTRAMUSCULAR | Status: DC
  Administered 2016-10-01 – 2016-10-04 (×10): 50 mg via INTRAVENOUS
  Filled 2016-10-01 (×2): qty 2
  Filled 2016-10-01 (×5): qty 1
  Filled 2016-10-01 (×2): qty 2
  Filled 2016-10-01 (×2): qty 1

## 2016-10-01 MED ORDER — FENTANYL CITRATE (PF) 100 MCG/2ML IJ SOLN
25.0000 ug | INTRAMUSCULAR | Status: DC | PRN
  Administered 2016-10-01 – 2016-10-02 (×3): 50 ug via INTRAVENOUS
  Administered 2016-10-03: 25 ug via INTRAVENOUS
  Administered 2016-10-03 – 2016-10-04 (×3): 50 ug via INTRAVENOUS
  Filled 2016-10-01 (×7): qty 2

## 2016-10-01 NOTE — Progress Notes (Signed)
[employer] [facility] [place] [facility]   PATIENT NAME: Travis [name]    MR#:  [id number]  DATE OF BIRTH:  07/25/1950  SUBJECTIVE:   Better body pain, still hypotension, On Neo drip.  REVIEW OF SYSTEMS:    Review of Systems  Constitutional: He has generalized weakness  HENT: Negative for ear pain, nosebleeds, congestion, facial swelling, rhinorrhea, neck pain, neck stiffness and ear discharge.   Respiratory: Negative for cough, shortness of breath, wheezing  Cardiovascular: Negative for chest pain, palpitations and leg swelling.  Gastrointestinal: Negative for heartburn, abdominal pain, ositos nausea and vomiting no diarrhea or constipation Genitourinary: Negative for dysuria, urgency, frequency, hematuria Musculoskeletal: Negative for back pain or joint pain Neurological: Negative for dizziness, seizures, syncope, focal weakness,  numbness and headaches.  Hematological: Does  bruise/bleed easily.  Psychiatric/Behavioral: Negative for hallucinations, confusion, dysphoric mood SKIN: b/l wound infections   Tolerating Diet:  yes DRUG ALLERGIES:  No Known Allergies  VITALS:  Blood pressure (!) 83/51, pulse 76, temperature 97.8 F (36.6 C), temperature source Oral, resp. rate 17, height  (1.676 m), weight 187 lb 2.7 oz (84.9 kg), SpO2 100 %.  PHYSICAL EXAMINATION:  Constitutional: Appears Chronically ill appearing HENT: Normocephalic. Marland Kitchen Oropharynx is clear and moist.  Eyes: Conjunctivae and EOM are normal. PERRLA, no scleral icterus.  Neck: Normal ROM. Neck supple. No JVD. No tracheal deviation. CVS: Irregular, irregular S1/S2 +, no murmurs, no gallops, no carotid bruit.  Pulmonary: Effort and breath sounds normal, no stridor, rhonchi, wheezes, rales.  Abdominal: Soft. BS +,  no distension, tenderness, rebound or guarding.  Musculoskeletal: He is able to move all extremities.  Neuro: Alert. CN 2-12 grossly intact. No focal deficits. Skin: b/l LE ulcerations  That are wrapped. Psychiatric: Depressed  mood and affect.  LABORATORY PANEL:   CBC  Recent Labs Lab 09/29/16 0750  WBC 9.5  HGB 9.9*  HCT 28.3*  PLT 141*   ------------------------------------------------------------------------------------------------------------------  Chemistries   Recent Labs Lab 09/25/16 0338  09/27/16 0729  10/01/16 0504  NA 131*  < > 131*  < > 128*  K 3.5  < > 3.3*  < > 4.3  CL 97*  < > 98*  < > 94*  CO2 24  < > 24  < > 24  GLUCOSE 61*  < > 128*  < > 109*  BUN 22*  < > 23*  < > 52*  CREATININE 3.87*  < > 3.51*  < > 6.66*  CALCIUM 8.0*  < > 8.5*  < > 8.6*  MG  --   --  1.9  --   --   BILITOT 2.5*  --   --   --   --   < > = values in this interval not displayed. ------------------------------------------------------------------------------------------------------------------  Cardiac Enzymes  Recent Labs Lab 09/26/16 2135 09/27/16 0729 09/27/16 1345  TROPONINI 1.40* 1.27* 1.12*   ------------------------------------------------------------------------------------------------------------------  RADIOLOGY:  Dg Chest 1 View  Result Date: 09/29/2016 CLINICAL DATA:  Status post central line placement. History of CHF, coronary artery disease, and diabetes. EXAM: CHEST 1 VIEW COMPARISON:  Chest x-ray of September 25, 2016 FINDINGS: The patient has undergone placement of a right internal jugular venous catheter. The tip projects over the junction of the middle and distal thirds of the SVC. There is no postprocedure pneumothorax. The interstitial markings of both lungs are coarse but not greatly changed since this earlier study allowing for differences in positioning. The cardiac silhouette is enlarged. The retrocardiac region  remains dense. The central pulmonary vascularity is prominent. There is calcification in the wall of the aortic arch. The patient has undergone previous CABG. IMPRESSION: There is no postprocedure pneumothorax since placement of  the right internal jugular venous catheter. Persistent mild CHF. Thoracic aortic atherosclerosis. Electronically Signed   By: David  [place] M.D.   On: 09/29/2016 16:35     ASSESSMENT AND PLAN:   66 y.o. male CAD s/p CABG, hx of STEMI, T2DM, ESRD on HD, PVD w/ a hx of bilateral toe and transmetatarsal amputation and chronic non-healing LE wounds. He is followed in [facility] wound clinic complicated by medical non-compliance presented to [facility] ED upon referral from hemodialysis for hypoglycemia and B/L lower extremity wounds.   1. Septic shock with hypotension, tachycardia and fevers from bilateral LE wounds continue Neo drip. Continue broad-spectrum antibiotics Blood cultures remain negative. Started on  midodrine.  2. Bilateral lower extremity wounds. Patient is followed at [facility] wound clinic. He has extensive eschar on all wounds. Patient has been seen and evaluated by surgery as well as podiatry and vascular surgery.  Patient is high risk for above-knee amputations and does not desire above-knee amputations. As per vascular surgery no role for debridement for local procedures for angiogram at this point. If at any point the patient desires to have amputation and vascular surgery can perform it here or patient may follow-up with his vascular surgeon at [facility] and wound care physicians at [facility].   Management as per wound care as follows: To posterior right lower leg and anterior and posterior left lower leg wounds, cleanse wounds with NS, pat dry, apply large Xeroform, cover  wounds with ABDs, wrap with kerlix, adhere kerlix to kerlix, no tape on skin, perform daily and prn soiling. Prevalon boots to prevent foot ulcers. Foam dressing to sacral ulcer, change Q3D and prn soiling. Peel back and assess Q shift.  Continue vancomycin and Zosyn ID consultation pending.  Wound culture showing  PMNs,  gram-positive cocci and gram-negative rods  3. Hypoglycemia: Remains on  D10 drip due to poor by mouth  intake  4. ESRD on hemodialysis:  He was on HD but would not tolerate intermittent hemodialysis treatment due to hypotension.  5. Chronic diastolic heart failure with preserved ejection fraction/chronic ischemic heart disease with history of CABG: No signs of acute exacerbation at this time  6. Hypokalemia:  Improved. Replete when necessary  7. Hyponatremia: Relatively stable  8. Anemia of chronic disease: Hemoglobin remained stable Will need Epogen during dialysis Hemoglobin range stable  9. Elevated bilirubin: Abdominal ultrasound without evidence of acute abdominal pathology.  10. Peripheral vascular disease: Continue aspirin and Plavix  11. Essential hypertension due to severe sepsis with hypotension blood pressure medications discontinued for now  12. Chronic atrial fibrillation:CHADS2VASC score 4 As per his cardiologist he is not a candidate for anticoagulation given his renal disease and noncompliance with increased risk of high bleeding and falls   13. Elevated troponin in the setting of septic shock and poor renal clearance: Discussed with intensivist he did not feel cardiology consultation was needed.  14. Nausea and vomiting: Right upper quadrant ultrasound shows sludge and KUB shows no acute abnormality This has resolved  15. Depression: Patient was evaluated by psychiatry. Remeron 7.5 mg added to his regimen, more for appetite then depression.  16. Moderate malnutrition: Continue nutritional supplements and omega-3 for wound healing.   Patient has very poor overall prognosis.  CODE STATUS is changed to DO NOT RESUSCITATE after palliative care  consultation. May move to comfort care tomorrow Management plans discussed with Dr. Wynelle Link.  CODE STATUS: DNR.   TOTAL TIME TAKING CARE OF THIS PATIENT: 28 minutes.   POSSIBLE D/C ??, DEPENDING ON CLINICAL CONDITION.  Discussed with intensivist Shaune Pollack M.D on 10/01/2016 at 2:36 PM  Between 7am to 6pm - Pager -  [phone] After 6pm go to [email or web address] - Social research officer, government  [facility]  Office  [phone]  CC: Primary care physician; [facility]  Note: This dictation was prepared with Nurse, children's dictation along with smaller phrase technology. Any transcriptional errors that result from this process are unintentional.

## 2016-10-01 NOTE — Progress Notes (Signed)
[facility] Critical Care Medicine Progess Note    SYNOPSIS   66 y o with ESRD, PVD, gangrenous foot ulcers, s/p septic shock-resolving  ASSESSMENT/PLAN   Severe Septic Shock with hypotension likely secondary to bilateral lower extremity wounds-- was on vasopressors, now weaned off.  Anemia without acute blood loss ESRD on hemodialysis Hypoglycemia.   Mildly elevated troponin likely secondary to demand ischemia Atrial Fibrillation-rate controlled  P:  Continue abx ID, Vascular, Nephrology, General Surgery, and Podiatry consulted appreciate input  Continuous telemetry monitoring  Prn norco for pain management  Subq heparin for VTE prophylaxis Started empiric steroids for hypotension 9.26 --Prognosis appears poor, pt can not go to floor due to persistent low BP.  Continue on  midodrine. Palliative care consulted, DNR/DNI. Wife is still considering comfort measures.     Micro/culture results: MRSA PCR positive.  BCx2 NTD UC -- Sputum--  Antibiotics: Zosyn 9/19>>  MAJOR EVENTS/TEST RESULTS:   Best Practices  DVT Prophylaxis: heparin GI Prophylaxis: --   ---------------------------------------   ----------------------------------------   Name: Travis [name] MRN: [id number] DOB: 07-Aug-1950    ADMISSION DATE:  09/29/2016  SUBJECTIVE:   No new complaints today, pt wants to go home.  No acute distress No pain On vasopressors    Review of Systems:  SEE HPI  VITAL SIGNS: Temp:  [97.7 F (36.5 C)-98.3 F (36.8 C)] 98.3 F (36.8 C) (09/26 0200) Pulse Rate:  [35-96] 72 (09/26 0600) Resp:  [12-24] 20 (09/26 0600) BP: (72-114)/(46-81) 76/53 (09/26 0600) SpO2:  [89 %-100 %] 100 % (09/26 0600) Weight:  [187 lb 2.7 oz (84.9 kg)] 187 lb 2.7 oz (84.9 kg) (09/26 0456)     Physical Examination:   VS: BP (!) 76/53   Pulse 72   Temp 98.3 F (36.8 C) (Axillary)   Resp 20   Ht  (1.676 m)   Wt 187 lb 2.7 oz (84.9 kg)   SpO2 100%   BMI 30.21 kg/m     General Appearance: No distress  Neuro:without focal findings, mental status normal. HEENT: PERRLA, EOM intact. Pulmonary: normal breath sounds   CardiovascularNormal S1,S2.  No m/r/g.   Abdomen: Benign, Soft, non-tender.   LABORATORY PANEL:   CBC  Recent Labs Lab 09/29/16 0750  WBC 9.5  HGB 9.9*  HCT 28.3*  PLT 141*    Chemistries   Recent Labs Lab 09/30/2016 1221 09/25/16 0338  09/27/16 0729  09/30/16 0208 10/01/16 0504  NA 133* 131*  < > 131*  < >  --  128*  K 2.6* 3.5  < > 3.3*  < >  --  4.3  CL 98* 97*  < > 98*  < >  --  94*  CO2 27 24  < > 24  < >  --  24  GLUCOSE 37* 61*  < > 128*  < >  --  109*  BUN 15 22*  < > 23*  < >  --  52*  CREATININE 3.00* 3.87*  < > 3.51*  < >  --  6.66*  CALCIUM 8.5* 8.0*  < > 8.5*  < >  --  8.6*  MG 1.8  --   --  1.9  --   --   --   PHOS  --   --   --  3.7  < > 5.9*  --   AST 45*  --   --  98 --   --   --   --   ALT  20  --   --   --   --   --   --   ALKPHOS 124  --   --   --   --   --   --   BILITOT 1.9* 2.5*  --   --   --   --   --   < > = values in this interval not displayed.   Recent Labs Lab 09/30/16 0720 09/30/16 1126 09/30/16 1613 09/30/16 2007 10/01/16 0021 10/01/16 0400  GLUCAP 93 103* 97 93 91 103*   No results for input(s): PHART, PCO2ART, PO2ART in the last 168 hours.  Recent Labs Lab 2016-10-14 1221 09/25/16 0338  AST 45*  --   ALT 20  --   ALKPHOS 124  --   BILITOT 1.9* 2.5*  ALBUMIN 2.3*  --     Cardiac Enzymes  Recent Labs Lab 09/27/16 1345  TROPONINI 1.12*     Deep Nicholos Johns, MD.   Board Certified in Internal Medicine, Pulmonary Medicine, Critical Care Medicine, and Sleep Medicine.  [facility] Office Number: [phone] Pager: [phone]  Santiago Glad, M.D.  Billy Fischer, M.D  10/01/2016

## 2016-10-02 DIAGNOSIS — Z992 Dependence on renal dialysis: Secondary | ICD-10-CM

## 2016-10-02 DIAGNOSIS — I959 Hypotension, unspecified: Secondary | ICD-10-CM

## 2016-10-02 DIAGNOSIS — N186 End stage renal disease: Secondary | ICD-10-CM

## 2016-10-02 LAB — BASIC METABOLIC PANEL
Anion gap: 12 (ref 5–15)
BUN: 61 mg/dL — AB (ref 6–20)
CHLORIDE: 91 mmol/L — AB (ref 101–111)
CO2: 24 mmol/L (ref 22–32)
Calcium: 8.6 mg/dL — ABNORMAL LOW (ref 8.9–10.3)
Creatinine, Ser: 7.12 mg/dL — ABNORMAL HIGH (ref 0.61–1.24)
GFR calc Af Amer: 8 mL/min — ABNORMAL LOW (ref >60)
GFR calc non Af Amer: 7 mL/min — ABNORMAL LOW (ref >60)
GLUCOSE: 151 mg/dL — AB (ref 65–99)
POTASSIUM: 5.2 mmol/L — AB (ref 3.5–5.1)
Sodium: 127 mmol/L — ABNORMAL LOW (ref 135–145)

## 2016-10-02 LAB — GLUCOSE, CAPILLARY
GLUCOSE-CAPILLARY: 148 mg/dL — AB (ref 65–99)
GLUCOSE-CAPILLARY: 153 mg/dL — AB (ref 65–99)
GLUCOSE-CAPILLARY: 169 mg/dL — AB (ref 65–99)
Glucose-Capillary: 138 mg/dL — ABNORMAL HIGH (ref 65–99)

## 2016-10-02 LAB — CBC
HCT: 29.2 % — ABNORMAL LOW (ref 40.0–52.0)
HEMOGLOBIN: 10.1 g/dL — AB (ref 13.0–18.0)
MCH: 34.6 pg — AB (ref 26.0–34.0)
MCHC: 34.7 g/dL (ref 32.0–36.0)
MCV: 99.7 fL (ref 80.0–100.0)
Platelets: 120 10*3/uL — ABNORMAL LOW (ref 150–440)
RBC: 2.93 MIL/uL — AB (ref 4.40–5.90)
RDW: 16.9 % — ABNORMAL HIGH (ref 11.5–14.5)
WBC: 7.4 10*3/uL (ref 3.8–10.6)

## 2016-10-02 MED ORDER — LORAZEPAM 2 MG/ML IJ SOLN
0.5000 mg | INTRAMUSCULAR | Status: DC | PRN
  Administered 2016-10-04: 16:00:00 0.5 mg via INTRAVENOUS
  Filled 2016-10-02: qty 1

## 2016-10-02 MED ORDER — GLYCOPYRROLATE 0.2 MG/ML IJ SOLN
0.2000 mg | INTRAMUSCULAR | Status: DC | PRN
  Filled 2016-10-02: qty 1

## 2016-10-02 NOTE — Progress Notes (Signed)
[facility]  ROUNDING NOTE   Subjective:   Lethargic. Off vasopressors.   Objective:  Vital signs in last 24 hours:  Temp:  [93.9 F (34.4 C)-98.7 F (37.1 C)] 93.9 F (34.4 C) (09/27 0845) Pulse Rate:  [29-91] 50 (09/27 0800) Resp:  [12-22] 13 (09/27 0800) BP: (72-109)/(44-66) 78/48 (09/27 0800) SpO2:  [95 %-100 %] 100 % (09/27 0800) Weight:  [88.1 kg (194 lb 3.6 oz)] 88.1 kg (194 lb 3.6 oz) (09/27 0830)  Weight change:  Filed Weights   09/30/16 0500 10/01/16 0456 10/02/16 0830  Weight: 86.1 kg (189 lb 13.1 oz) 84.9 kg (187 lb 2.7 oz) 88.1 kg (194 lb 3.6 oz)    Intake/Output: I/O last 3 completed shifts: In: 1473 [I.V.:1473] Out: -    Intake/Output this shift:  Total I/O In: 80 [I.V.:80] Out: 0   Physical Exam: General: Ill appearing  Head: Normocephalic, atraumatic. Dry oral mucosal membranes  Eyes: Eyes closed  Neck: Supple, trachea midline  Lungs:  Diminished bilaterally   Heart: S1S2 no rubs  Abdomen:  Soft, nontender, bowel sounds present  Extremities: Bilateral lower extremeties wrapped, 1+ bilateral LE edema.  Neurologic: Waxing and waning mental status  Skin: No rashes  Access: Left AVF    Basic Metabolic Panel:  Recent Labs Lab 09/27/16 0729 09/28/16 0628 09/29/16 0750 09/29/16 1059 09/30/16 0208 10/01/16 0504 10/02/16 0456  NA 131* 132* 130*  --   --  128* 127*  K 3.3* 3.6 4.7  --   --  4.3 5.2*  CL 98* 95* 95*  --   --  94* 91*  CO2 --   --  24 24  GLUCOSE 128* 119* 104*  --   --  109* 151*  BUN 23* 33* 44*  --   --  52* 61*  CREATININE 3.51* 4.49* 5.36*  --   --  6.66* 7.12*  CALCIUM 8.5* 8.8* 8.7*  --   --  8.6* 8.6*  MG 1.9  --   --   --   --   --   --   PHOS 3.7  --   --  5.3* 5.9*  --   --     Liver Function Tests: No results for input(s): AST, ALT, ALKPHOS, BILITOT, PROT, ALBUMIN in the last 168 hours. No results for input(s): LIPASE, AMYLASE in the last 168 hours. No results for input(s): AMMONIA in  the last 168 hours.  CBC:  Recent Labs Lab 09/26/16 0208 09/27/16 0729 09/28/16 0628 09/29/16 0750 10/02/16 0456  WBC 12.6* 11.3* 8.8 9.5 7.4  HGB 9.5* 9.3* 9.2* 9.9* 10.1*  HCT 28.6* 27.3* 27.0* 28.3* 29.2*  MCV 100.3* 102.0* 100.5* 101.3* 99.7  PLT 168 138* 159 141* 120*    Cardiac Enzymes:  Recent Labs Lab 09/25/16 2138 09/26/16 0208 09/26/16 2135 09/27/16 0729 09/27/16 1345  TROPONINI 1.27* 1.38* 1.40* 1.27* 1.12*    BNP: Invalid input(s): POCBNP  CBG:  Recent Labs Lab 10/01/16 1632 10/01/16 1956 10/02/16 0000 10/02/16 0416 10/02/16 0733  GLUCAP 123* 127* 148* 138* 153*    Microbiology: Results for orders placed or performed during the hospital encounter of October 23, 2016  Blood Culture (routine x 2)     Status: None   Collection Time: 10-23-16 12:21 PM  Result Value Ref Range Status   Specimen Description BLOOD RIGHT [facility]  Final   Special Requests   Final    BOTTLES DRAWN AEROBIC AND ANAEROBIC Blood Culture adequate volume  Culture NO GROWTH 5 DAYS  Final   Report Status 09/29/2016 FINAL  Final  Blood Culture (routine x 2)     Status: None   Collection Time: October 04, 2016 12:21 PM  Result Value Ref Range Status   Specimen Description BLOOD BLOOD RIGHT HAND  Final   Special Requests   Final    BOTTLES DRAWN AEROBIC AND ANAEROBIC Blood Culture results may not be optimal due to an inadequate volume of blood received in culture bottles   Culture NO GROWTH 5 DAYS  Final   Report Status 09/29/2016 FINAL  Final  Surgical PCR screen     Status: Abnormal   Collection Time: 09/09/2016  6:31 PM  Result Value Ref Range Status   MRSA, PCR POSITIVE (A) NEGATIVE Final    Comment: RESULT CALLED TO, READ BACK BY AND VERIFIED WITH: CRYSTAL BASS AT 2041 04-Oct-2016 BY TFK    Staphylococcus aureus POSITIVE (A) NEGATIVE Final    Comment: (NOTE) The Xpert SA Assay (FDA approved for NASAL specimens in patients 7 years of age and older), is one component of a  comprehensive surveillance program. It is not intended to diagnose infection nor to guide or monitor treatment.   Aerobic Culture (superficial specimen)     Status: None   Collection Time: 09/25/16  2:30 PM  Result Value Ref Range Status   Specimen Description FOOT  Final   Special Requests Normal  Final   Gram Stain   Final    RARE WBC PRESENT,BOTH PMN AND MONONUCLEAR RARE GRAM POSITIVE COCCI RARE GRAM NEGATIVE RODS    Culture   Final    MODERATE METHICILLIN RESISTANT STAPHYLOCOCCUS AUREUS WITHIN MIXED CULTURE Performed at [facility], [address], [place], [place] [address]    Report Status 09/29/2016 FINAL  Final   Organism ID, Bacteria METHICILLIN RESISTANT STAPHYLOCOCCUS AUREUS  Final      Susceptibility   Methicillin resistant staphylococcus aureus - MIC*    CIPROFLOXACIN >=8 RESISTANT Resistant     ERYTHROMYCIN >=8 RESISTANT Resistant     GENTAMICIN <=0.5 SENSITIVE Sensitive     OXACILLIN >=4 RESISTANT Resistant     TETRACYCLINE <=1 SENSITIVE Sensitive     VANCOMYCIN <=0.5 SENSITIVE Sensitive     TRIMETH/SULFA <=10 SENSITIVE Sensitive     CLINDAMYCIN RESISTANT Resistant     RIFAMPIN <=0.5 SENSITIVE Sensitive     Inducible Clindamycin POSITIVE Resistant     * MODERATE METHICILLIN RESISTANT STAPHYLOCOCCUS AUREUS    Coagulation Studies: No results for input(s): LABPROT, INR in the last 72 hours.  Urinalysis: No results for input(s): COLORURINE, LABSPEC, PHURINE, GLUCOSEU, HGBUR, BILIRUBINUR, KETONESUR, PROTEINUR, UROBILINOGEN, NITRITE, LEUKOCYTESUR in the last 72 hours.  Invalid input(s): APPERANCEUR    Imaging: No results found.   Medications:   . sodium chloride    . sodium chloride    . dextrose 40 mL/hr at 10/02/16 0800  . phenylephrine (NEO-SYNEPHRINE) Adult infusion Stopped (10/01/16 0002)  . vancomycin Stopped (10/01/16 1317)   . fentaNYL  12.5 mcg Transdermal Q72H  . hydrocortisone sod succinate (SOLU-CORTEF) inj  50 mg Intravenous Q8H   . mouth rinse  15 mL Mouth Rinse BID  . sodium chloride flush  10-40 mL Intracatheter Q12H   sodium chloride, sodium chloride, acetaminophen **OR** acetaminophen, albuterol, alteplase, fentaNYL (SUBLIMAZE) injection, lidocaine (PF), LORazepam, [DISCONTINUED] ondansetron **OR** ondansetron (ZOFRAN) IV, sodium chloride flush  Assessment/ Plan:  66 y.o. male with diabetes, hypertension, coronary artery disease s/p CABG 2011, anemia of CKD, SHPTH, who presents  now with b/l LE ulcerations  MWF [facility] [address]   1. End Stage Renal Disease: Holding dialysis due to hypotension. Currently off vasopressors. Criticaly ill.  Family wants no aggressive measures. He has already decided to be DNR/DNI.  Plan on withdrawal of care later today Last hemodialysis was Friday, 9/21 Appreciate palliative care.   2. Hypotension. With sepsis and vascular disease   3. Anemia of chronic kidney disease: hemoglobin 9.9  4. Secondary Hyperparathyroidism: currently not on binders.   Overall prognosis poor. Discussed case with palliative care and family with Spanish Interpreter 9/25. Plan on moving towards comfort care and plan on withdrawal of care later today.   LOS: 8 [place] Luppino 9/27/20189:49 AM

## 2016-10-02 NOTE — Progress Notes (Signed)
Daily Progress Note   Patient Name: Travis [name]       Date: 10/01/16 DOB: 1950/01/15  Age: 66 y.o. MRN#: [id number] Attending Physician: Demetrios Loll, MD Primary Care Physician: [facility] [place] Date: 09/22/2016  Reason for Consultation/Follow-up: Establishing goals of care  Subjective: Mr. Cardell is more lethargic today.    Length of Stay: 8  Current Medications: Scheduled Meds:  . fentaNYL  12.5 mcg Transdermal Q72H  . hydrocortisone sod succinate (SOLU-CORTEF) inj  50 mg Intravenous Q8H  . mouth rinse  15 mL Mouth Rinse BID  . sodium chloride flush  10-40 mL Intracatheter Q12H    Continuous Infusions: . sodium chloride    . sodium chloride    . dextrose 40 mL/hr at 10/02/16 0800  . phenylephrine (NEO-SYNEPHRINE) Adult infusion Stopped (10/01/16 0002)  . vancomycin Stopped (10/01/16 1317)    PRN Meds: sodium chloride, sodium chloride, acetaminophen **OR** acetaminophen, albuterol, alteplase, fentaNYL (SUBLIMAZE) injection, lidocaine (PF), LORazepam, [DISCONTINUED] ondansetron **OR** ondansetron (ZOFRAN) IV, sodium chloride flush  Physical Exam         Constitutional: He appears well-developed.  HENT:  Head: Normocephalic and atraumatic.  Cardiovascular: Normal rate.  An irregularly irregular rhythm present.  Pulmonary/Chest: Effort normal. No accessory muscle usage. No tachypnea. No respiratory distress.  Abdominal: Soft. Normal appearance.  Neurological: He is alert. He is disoriented.  Nursing note and vitals reviewed.  Vital Signs: BP (!) 78/48 (BP Location: Right Arm)   Pulse (!) 50   Temp (!) 93.9 F (34.4 C) (Oral)   Resp 13   Ht _0  (1.676 m)   Wt 88.1 kg (194 lb 3.6 oz)   SpO2 100%   BMI 31.35 kg/m  SpO2: SpO2: 100 % O2  Device: O2 Device: Not Delivered O2 Flow Rate: O2 Flow Rate (L/min): 2 L/min  Intake/output summary:   Intake/Output Summary (Last 24 hours) at 10/02/16 0851 Last data filed at 10/02/16 0800  Gross per 24 hour  Intake              960 ml  Output                0 ml  Net              960 ml   LBM: Last BM Date:  09/26/16 Baseline Weight: Weight: 80 kg (176 lb 5.9 oz) Most recent weight: Weight: 88.1 kg (194 lb 3.6 oz)       Palliative Assessment/Data: 20%     Patient Active Problem List   Diagnosis Date Noted  . Wound infection   . Goals of care, counseling/discussion   . Palliative care encounter   . 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 Chronic ulcer of right lower extremity with necrosis of muscle ([place])   . PAD (peripheral artery disease) ([place])   . Pressure injury of skin 09/26/2016  . Depression 09/26/2016  . Anorexia 09/26/2016  . Subacute delirium 09/26/2016  . Cellulitis of leg 09/08/2016  . Unstable angina ([place]) 02/21/2015    Palliative Care Assessment & Plan   HPI: 66 y.o. male  with past medical history of diastolic CHF, CAD, STEMI, ESRD on HD, diabetes mellitus, osteomyelitis, recent transmetatarsal amputation admitted on 09/25/2016 with worsening leg pain with infection and hypoglycemia. Continues with gangrenous foot ulcers and vasopressors. Not eating (per wife was not eating at home either). Prognosis is very poor.   Assessment: I met today again with wife, [name]. [name] does speak some [place]. Nothing much new to report to her. Reinforced conversation from yesterday and offered emotional support. Explained that he is more lethargic d/t no dialysis and with some pain medication.   He did have some visitors from his church come who helped me reach his supposed [place] (although I was told the notary was HCPOA niece and 1 witness was his brother). I did update Mr. Dyann Kief regarding Mr. Mamula status with  infection, hypotension, and inability to tolerate dialysis and that our goal for him is comfort at EOL. All questions/concerns addressed. No changes in plan for comfort.   Recommendations/Plan:  Pain/dyspnea: Fentanyl patch 12.5 mcg/hr. Fentanyl 25-50 mcg every 2 hours prn.   Goals of Care and Additional Recommendations:  Limitations on Scope of Treatment: Full Comfort Care  Code Status:  DNR  Prognosis:   Hours - Days  Discharge Planning:  Likely hospital death. Hospice facility if appropriate.    Thank you for allowing the Palliative Medicine Team to assist in the care of this patient.   Total Time 50 min Prolonged Time Billed  no       Greater than 50%  of this time was spent counseling and coordinating care related to the above assessment and plan.  Vinie Sill, NP Palliative Medicine Team Pager # [phone] (M-F 8a-5p) Team Phone # [phone] (Nights/Weekends)

## 2016-10-02 NOTE — Progress Notes (Signed)
[facility] Medicine Progess Note    SYNOPSIS   66 y o with ESRD, PVD, gangrenous foot ulcers, s/p septic shock-resolving  ASSESSMENT/PLAN   Severe Septic Shock with hypotension likely secondary to bilateral lower extremity wounds-- was on vasopressors, now weaned off. Declines AKA.  Anemia without acute blood loss ESRD on hemodialysis-- has been requiring vasopressors for HD.  Mildly elevated troponin likely secondary to demand ischemia Atrial Fibrillation-rate controlled  P:  Continue abx ID, Vascular, Nephrology, General Surgery, and Podiatry consulted appreciate input  Continuous telemetry monitoring  Prn norco for pain management  Subq heparin for VTE prophylaxis Started empiric steroids for hypotension 9.26 --Prognosis appears poor, pt can not go to floor due to persistent low BP.  Continue on  Midodrine, decrease empiric steroids (started for low BP).  Palliative care consulted, DNR/DNI. Wife is still considering comfort measures.      Micro/culture results: MRSA PCR positive.  BCx2 NTD UC -- Sputum--  Antibiotics: Zosyn 9/19>>  MAJOR EVENTS/TEST RESULTS:   Best Practices  DVT Prophylaxis: heparin GI Prophylaxis: --   ---------------------------------------   ----------------------------------------   Name: Travis [name] MRN: [id number] DOB: Jun 12, 1950    ADMISSION DATE:  15-Oct-2016  SUBJECTIVE:   No new complaints today, pt wants to go home.  No acute distress No pain Off vasopressors    Review of Systems:  Pt denies chest pain, PND, orthopnea. The remainder of the review of systems was reviewed with the patient was found to be negative.  VITAL SIGNS: Temp:  [97.5 F (36.4 C)-98.7 F (37.1 C)] 97.5 F (36.4 C) (09/27 0100) Pulse Rate:  [29-91] 91 (09/27 0600) Resp:  [12-22] 14 (09/27 0600) BP: (72-109)/(44-66) 77/46 (09/27 0600) SpO2:  [95 %-100 %] 100 % (09/27 0600)     Physical Examination:   VS: BP (!)  77/46   Pulse 91   Temp (!) 97.5 F (36.4 C) (Axillary)   Resp 14   Ht  (1.676 m)   Wt 187 lb 2.7 oz (84.9 kg)   SpO2 100%   BMI 30.21 kg/m    General Appearance: No distress  Neuro:without focal findings, mental status normal. HEENT: PERRLA, EOM intact. Pulmonary: normal breath sounds   CardiovascularNormal S1,S2.  No m/r/g.   Abdomen: Benign, Soft, non-tender.   LABORATORY PANEL:   CBC  Recent Labs Lab 10/02/16 0456  WBC 7.4  HGB 10.1*  HCT 29.2*  PLT 120*    Chemistries   Recent Labs Lab 09/27/16 0729  09/30/16 0208  10/02/16 0456  NA 131*  < >  --   < > 127*  K 3.3*  < >  --   < > 5.2*  CL 98*  < >  --   < > 91*  CO2 24  < >  --   < > 24  GLUCOSE 128*  < >  --   < > 151*  BUN 23*  < >  --   < > 61*  CREATININE 3.51*  < >  --   < > 7.12*  CALCIUM 8.5*  < >  --   < > 8.6*  MG 1.9  --   --   --   --   PHOS 3.7  < > 5.9*  --   --   < > = values in this interval not displayed.   Recent Labs Lab 10/01/16 0737 10/01/16 1129 10/01/16 1632 10/01/16 1956 10/02/16 0000 10/02/16 0416  GLUCAP 94 101* 123*  127* 148* 138*   No results for input(s): PHART, PCO2ART, PO2ART in the last 168 hours. No results for input(s): AST, ALT, ALKPHOS, BILITOT, ALBUMIN in the last 168 hours.  Cardiac Enzymes  Recent Labs Lab 09/27/16 1345  TROPONINI 1.12*     Deep Nicholos Johns, MD.   Board Certified in Internal Medicine, Pulmonary Medicine, Critical Care Medicine, and Sleep Medicine.  [facility] Office Number: [phone] Pager: [phone]  Santiago Glad, M.D.  Billy Fischer, M.D  10/02/2016

## 2016-10-02 NOTE — Progress Notes (Signed)
Pt remains in stable condition. Patient has slept most of my shift today. Pt continues to refuse all care. Pt is refusing to turn. Pt continues to have poor PO intake. Family did visit today. Full assessment can be found in EPIC. Report given to [place], [place]

## 2016-10-02 NOTE — Progress Notes (Signed)
Daily Progress Note   Patient Name: URBAN NAVAL       Date: 10/01/16 DOB: 01-23-1950  Age: 66 y.o. MRN#: [id number] Attending Physician: Demetrios Loll, MD Primary Care Physician: [facility] [place] Date: 10/03/2016  Reason for Consultation/Follow-up: Establishing goals of care  Subjective: Mr. Corp is a little more alert today.    Length of Stay: 8  Current Medications: Scheduled Meds:  . fentaNYL  12.5 mcg Transdermal Q72H  . hydrocortisone sod succinate (SOLU-CORTEF) inj  50 mg Intravenous Q8H  . mouth rinse  15 mL Mouth Rinse BID  . sodium chloride flush  10-40 mL Intracatheter Q12H    Continuous Infusions: . sodium chloride    . sodium chloride    . dextrose 40 mL/hr at 10/02/16 0800  . phenylephrine (NEO-SYNEPHRINE) Adult infusion Stopped (10/01/16 0002)  . vancomycin Stopped (10/01/16 1317)    PRN Meds: sodium chloride, sodium chloride, acetaminophen **OR** acetaminophen, albuterol, alteplase, fentaNYL (SUBLIMAZE) injection, lidocaine (PF), LORazepam, [DISCONTINUED] ondansetron **OR** ondansetron (ZOFRAN) IV, sodium chloride flush  Physical Exam         Constitutional: He appears well-developed.  HENT:  Head: Normocephalic and atraumatic.  Cardiovascular: Normal rate.  An irregularly irregular rhythm present.  Pulmonary/Chest: Effort normal. No accessory muscle usage. No tachypnea. No respiratory distress.  Abdominal: Soft. Normal appearance.  Neurological: He is alert. He is disoriented.  Nursing note and vitals reviewed.  Vital Signs: BP (!) 78/48 (BP Location: Right Arm)   Pulse (!) 50   Temp (!) 93.9 F (34.4 C) (Oral)   Resp 13   Ht '5\' 6"'$  (1.676 m)   Wt 88.1 kg (194 lb 3.6 oz)   SpO2 100%   BMI 31.35 kg/m  SpO2: SpO2: 100 % O2  Device: O2 Device: Not Delivered O2 Flow Rate: O2 Flow Rate (L/min): 2 L/min  Intake/output summary:   Intake/Output Summary (Last 24 hours) at 10/02/16 1120 Last data filed at 10/02/16 0800  Gross per 24 hour  Intake              880 ml  Output                0 ml  Net              880 ml   LBM: Last  BM Date: 09/26/16 Baseline Weight: Weight: 80 kg (176 lb 5.9 oz) Most recent weight: Weight: 88.1 kg (194 lb 3.6 oz)       Palliative Assessment/Data: 20%     Patient Active Problem List   Diagnosis Date Noted  . ESRD (end stage renal disease) on dialysis ([place])   . Wound infection   . Goals of care, counseling/discussion   . Palliative care encounter   . 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 Chronic ulcer of right lower extremity with necrosis of muscle ([place])   . PAD (peripheral artery disease) ([place])   . Pressure injury of skin 09/26/2016  . Depression 09/26/2016  . Anorexia 09/26/2016  . Subacute delirium 09/26/2016  . Cellulitis of leg 10/02/2016  . Unstable angina ([place]) 02/21/2015    Palliative Care Assessment & Plan   HPI: 66 y.o. male  with past medical history of diastolic CHF, CAD, STEMI, ESRD on HD, diabetes mellitus, osteomyelitis, recent transmetatarsal amputation admitted on 10/03/2016 with worsening leg pain with infection and hypoglycemia. Continues with gangrenous foot ulcers and vasopressors. Not eating (per wife was not eating at home either). Prognosis is very poor.   Assessment: I met today with Mr. Wenke, wife [name], and Spanish interpretor Ronnald Collum. Mr. Poehler had many questions regarding what is going on, who I am, and what the plan is. I clarified the conversation that we have had over the past few days with severe infection, low blood pressure, not eating (even at home appetite poor for past few months), and the fact that his body will not tolerate dialysis. Discussed that the IV medication and fluids are keeping him  alive for now and explained that without dialysis he will not live long. They asked what the plan is from here. We discussed plan to stop D10 and transition out of ICU to regular floor for comfort focused care. Explained that if it makes sense and appropriate we may consider transition to hospice tomorrow.   Spoke privately with [name] and [place]. [name] is requesting information regarding cremation and costs. Discussed process of working with agency for cremation and funeral options. AC Ann to come provide information for [facility].   All questions/concerns addressed. Emotional support provided.   Recommendations/Plan:  Pain/dyspnea: Fentanyl patch 12.5 mcg/hr. Fentanyl 25-50 mcg every 2 hours prn.   Goals of Care and Additional Recommendations:  Limitations on Scope of Treatment: Full Comfort Care  Code Status:  DNR  Prognosis:   Hours - Days  Discharge Planning:  Likely hospital death. Hospice facility if appropriate.    Thank you for allowing the Palliative Medicine Team to assist in the care of this patient.   Total Time 35 min Prolonged Time Billed  no       Greater than 50%  of this time was spent counseling and coordinating care related to the above assessment and plan.  Vinie Sill, NP Palliative Medicine Team Pager # [phone] (M-F 8a-5p) Team Phone # [phone] (Nights/Weekends)

## 2016-10-02 NOTE — Progress Notes (Signed)
[employer] [facility] [place] [facility]   PATIENT NAME: Travis [name]    MR#:  [id number]  DATE OF BIRTH:  05-31-1950  SUBJECTIVE:   No complaint.  Neo drip is discontinued. REVIEW OF SYSTEMS:    Review of Systems  Constitutional: He has generalized weakness  HENT: Negative for ear pain, nosebleeds, congestion, facial swelling, rhinorrhea, neck pain, neck stiffness and ear discharge.   Respiratory: Negative for cough, shortness of breath, wheezing  Cardiovascular: Negative for chest pain, palpitations and leg swelling.  Gastrointestinal: Negative for heartburn, abdominal pain, ositos nausea and vomiting no diarrhea or constipation Genitourinary: Negative for dysuria, urgency, frequency, hematuria Musculoskeletal: Negative for back pain or joint pain Neurological: Negative for dizziness, seizures, syncope, focal weakness,  numbness and headaches.  Hematological: Does  bruise/bleed easily.  Psychiatric/Behavioral: Negative for hallucinations, confusion, dysphoric mood SKIN: b/l wound infections   Tolerating Diet:  yes DRUG ALLERGIES:  No Known Allergies  VITALS:  Blood pressure (!) 78/48, pulse (!) 50, temperature (!) 93.9 F (34.4 C), temperature source Oral, resp. rate 13, height  (1.676 m), weight 194 lb 3.6 oz (88.1 kg), SpO2 100 %.  PHYSICAL EXAMINATION:  Constitutional: Appears Chronically ill appearing HENT: Normocephalic. Marland Kitchen Oropharynx is clear and moist.  Eyes: Conjunctivae and EOM are normal. PERRLA, no scleral icterus.  Neck: Normal ROM. Neck supple. No JVD. No tracheal deviation. CVS: Irregular, irregular S1/S2 +, no murmurs, no gallops, no carotid bruit.  Pulmonary: Effort and breath sounds normal, no stridor, rhonchi, wheezes, rales.  Abdominal: Soft. BS +,  no distension, tenderness, rebound or guarding.  Musculoskeletal: He is able to move all extremities.  Neuro: Alert. CN 2-12 grossly intact. No focal deficits. Skin: b/l LE ulcerations That  are wrapped. Psychiatric: Depressed  mood and affect.  LABORATORY PANEL:   CBC  Recent Labs Lab 10/02/16 0456  WBC 7.4  HGB 10.1*  HCT 29.2*  PLT 120*   ------------------------------------------------------------------------------------------------------------------  Chemistries   Recent Labs Lab 09/27/16 0729  10/02/16 0456  NA 131*  < > 127*  K 3.3*  < > 5.2*  CL 98*  < > 91*  CO2 24  < > 24  GLUCOSE 128*  < > 151*  BUN 23*  < > 61*  CREATININE 3.51*  < > 7.12*  CALCIUM 8.5*  < > 8.6*  MG 1.9  --   --   < > = values in this interval not displayed. ------------------------------------------------------------------------------------------------------------------  Cardiac Enzymes  Recent Labs Lab 09/26/16 2135 09/27/16 0729 09/27/16 1345  TROPONINI 1.40* 1.27* 1.12*   ------------------------------------------------------------------------------------------------------------------  RADIOLOGY:  No results found.   ASSESSMENT AND PLAN:   66 y.o. male CAD s/p CABG, hx of STEMI, T2DM, ESRD on HD, PVD w/ a hx of bilateral toe and transmetatarsal amputation and chronic non-healing LE wounds. He is followed in [facility] wound clinic complicated by medical non-compliance presented to [facility] ED upon referral from hemodialysis for hypoglycemia and B/L lower extremity wounds.   1. Septic shock with hypotension, tachycardia and fevers from bilateral LE wounds Restarted Neo drip. Continue broad-spectrum antibiotics Blood cultures remain negative. Started on  midodrine.  2. Bilateral lower extremity wounds. Patient is followed at [facility] wound clinic. He has extensive eschar on all wounds. Patient has been seen and evaluated by surgery as well as podiatry and vascular surgery.  Patient is high risk for above-knee amputations and does not desire above-knee amputations. As per vascular surgery no role for debridement for local procedures for angiogram  at this point. If at any  point the patient desires to have amputation and vascular surgery can perform it here or patient may follow-up with his vascular surgeon at [facility] and wound care physicians at [facility].   Management as per wound care as follows: To posterior right lower leg and anterior and posterior left lower leg wounds, cleanse wounds with NS, pat dry, apply large Xeroform, cover  wounds with ABDs, wrap with kerlix, adhere kerlix to kerlix, no tape on skin, perform daily and prn soiling. Prevalon boots to prevent foot ulcers. Foam dressing to sacral ulcer, change Q3D and prn soiling. Peel back and assess Q shift.  vancomycin and Zosyn are discontinued.  Wound culture: MRSA.  3. Hypoglycemia: on  D10 drip due to poor by mouth intake  4. ESRD on hemodialysis:  He was on HD but would not tolerate intermittent hemodialysis treatment due to hypotension.  5. Chronic diastolic heart failure with preserved ejection fraction/chronic ischemic heart disease with history of CABG: No signs of acute exacerbation at this time  6. Hypokalemia:  Improved.  * Hyperkalemia.  7. Hyponatremia: Na down to 127.  8. Anemia of chronic disease: Hemoglobin remained stable Will need Epogen during dialysis Hemoglobin range stable  9. Elevated bilirubin: Abdominal ultrasound without evidence of acute abdominal pathology.  10. Peripheral vascular disease: discontinue aspirin and Plavix  11. Essential hypertension due to severe sepsis with hypotension blood pressure medications discontinued.  12. Chronic atrial fibrillation:CHADS2VASC score 4 As per his cardiologist he is not a candidate for anticoagulation given his renal disease and noncompliance with increased risk of high bleeding and falls   13. Elevated troponin in the setting of septic shock and poor renal clearance: Discussed with intensivist he did not feel cardiology consultation was needed.  14. Nausea and vomiting: Right upper quadrant ultrasound shows sludge and KUB  shows no acute abnormality This has resolved  15. Depression: Patient was evaluated by psychiatry. Remeron 7.5 mg added to his regimen, more for appetite then depression.  16. Moderate malnutrition: Continue nutritional supplements and omega-3 for wound healing.   Patient has very poor overall prognosis.  CODE STATUS is changed to DO NOT RESUSCITATE after palliative care consultation.  Per Palliative care NP Ms. Jimmey Ralph,  plan to stop D10 and transition out of ICU to regular floor for comfort focused care, may consider transition to hospice tomorrow.   Management plans discussed with the patient and he is in agreement.  CODE STATUS: DNR.   TOTAL TIME TAKING CARE OF THIS PATIENT: 27 minutes.   POSSIBLE D/C ??, DEPENDING ON CLINICAL CONDITION.  Discussed with intensivist Shaune Pollack M.D on 10/02/2016 at 1:22 PM  Between 7am to 6pm - Pager - [phone] After 6pm go to [email or web address] - Social research officer, government  [facility] [place]  Office  [phone]  CC: Primary care physician; [facility]  Note: This dictation was prepared with Nurse, children's dictation along with smaller phrase technology. Any transcriptional errors that result from this process are unintentional.

## 2016-10-03 DIAGNOSIS — T148XXA Other injury of unspecified body region, initial encounter: Secondary | ICD-10-CM

## 2016-10-03 DIAGNOSIS — Z992 Dependence on renal dialysis: Secondary | ICD-10-CM

## 2016-10-03 DIAGNOSIS — Z515 Encounter for palliative care: Secondary | ICD-10-CM

## 2016-10-03 DIAGNOSIS — E162 Hypoglycemia, unspecified: Secondary | ICD-10-CM

## 2016-10-03 DIAGNOSIS — Z7189 Other specified counseling: Secondary | ICD-10-CM

## 2016-10-03 DIAGNOSIS — L089 Local infection of the skin and subcutaneous tissue, unspecified: Secondary | ICD-10-CM

## 2016-10-03 NOTE — Clinical Social Work Note (Addendum)
CSW received referral for possible hospice facility placement, CSW was informed that patient's family is trying to decide what they want to do.  CSW to continue to follow patient's progress throughout discharge planning.  Travis [name]. Travis [name], MSW, Theresia Majors [phone]  10/03/2016 6:38 PM

## 2016-10-03 NOTE — Care Management Important Message (Signed)
Important Message  Patient Details  Name: Travis [name] MRN: [id number] Date of Birth: 12-16-1950   Medicare Important Message Given:  Yes  Spanish Version     Gwenette Greet, RN 10/03/2016, 8:58 AM

## 2016-10-03 NOTE — Progress Notes (Signed)
Daily Progress Note   Patient Name: Travis [name]       Date: 10/01/16 DOB: 1950/09/28  Age: 66 y.o. MRN#: [id number] Attending Physician: Shaune Pollack, MD Primary Care Physician: [facility] Admit Date: 10/03/2016  Reason for Consultation/Follow-up: Establishing goals of care  Subjective/GOC: [name] is awake, alert, and asking multiple questions this morning to interpreter regarding plan of care.   1030: Spoke with patient and wife Tiney Rouge) at bedside with interpreter. Discussed diagnoses, interventions, comfort focused care, and poor prognosis. Travis [name] pulls interpreter and I outside the room. She tells Travis [name] she does not want Travis [name] to talk to [facility] about hospice and EOL. She wants him to be "happy" and "not worry" at the end of his life. He is most worried about her visiting with him on discharge.   1330: Later this afternoon, I visited with [facility] without family at bedside. Patient has been awake and alert throughout the day. Living will documentation in chart speaks of his wishes to make own decisions if able to do so. Again discussed (with interpreter present) hospital diagnoses, interventions, underlying co-morbidities, guarded prognosis, and patient wishes. Ivann does have periods of confusion but after our conversation, he has a good understanding of hospitalization including septic shock due to BLE infections and inability to tolerate further dialysis due to low blood pressure.  I asked [place] about the documentation in chart. He does remember completing this but tells the interpreter "this is from the past." HCPOA on document is his church pastor. He wishes for his wife to be documented HCPOA and wishes for this to be written down today while he is coherent.  Educated on chaplain services to come to bedside to complete a new advanced directive documenting wife as HCPOA.   Explored patient's thoughts on his wishes moving forward including focus on comfort. He tells me to "do what you can" to prevent having the leg amputated. He also asks if less fluid could be taken off during dialysis. Explained again that he has not been stable for dialysis due to low blood pressure but nephrology will continue to shadow chart/offer more dialysis if appropriate. In conclusion, patient tells me he trusts the medical team to make the best decisions for him. He does understand time may be short and states he is "leaving in  the Lord's hands." He is open tTiney Rougepice services if he further declines through the weekend.   Notified wife, Travis [name], on my conversation with her husband. She is agreeable to hold on discharge to hospice today.   Length of Stay: 9  Current Medications: Scheduled Meds:  . fentaNYL  12.5 mcg Transdermal Q72H  . hydrocortisone sod succinate (SOLU-CORTEF) inj  50 mg Intravenous Q8H  . mouth rinse  15 mL Mouth Rinse BID  . sodium chloride flush  10-40 mL Intracatheter Q12H    Continuous Infusions:  PRN Meds: acetaminophen **OR** acetaminophen, albuterol, fentaNYL (SUBLIMAZE) injection, glycopyrrolate, LORazepam, [DISCONTINUED] ondansetron **OR** ondansetron (ZOFRAN) IV, sodium chloride flush  Physical Exam  Constitutional: He appears ill.  HENT:  Head: Normocephalic and atraumatic.  Cardiovascular: Regular rhythm.   Pulmonary/Chest: No accessory muscle usage. No tachypnea. No respiratory distress.  Abdominal: Normal appearance.  Neurological: He is alert.  Pleasantly confused  Skin: Skin is warm and dry.  Nursing note and vitals reviewed.          Vital Signs: BP (!) 93/49 (BP Location: Right Arm)   Pulse 91   Temp 97.6 F (36.4 C) (Oral)   Resp 18   Ht  (1.676 m)   Wt 88.1 kg (194 lb 3.6 oz)   SpO2 97%   BMI 31.35 kg/m    SpO2: SpO2: 97 % O2 Device: O2 Device: Not Delivered O2 Flow Rate: O2 Flow Rate (L/min): 2 L/min  Intake/output summary:   Intake/Output Summary (Last 24 hours) at 10/03/16 1027 Last data filed at 10/03/16 0018  Gross per 24 hour  Intake              120 ml  Output                0 ml  Net              120 ml   LBM: Last BM Date: 10/03/16 Baseline Weight: Weight: 80 kg (176 lb 5.9 oz) Most recent weight: Weight: 88.1 kg (194 lb 3.6 oz)       Palliative Assessment/Data: PPS 20%     Patient Active Problem List   Diagnosis Date Noted  . ESRD (end stage renal disease) on dialysis ([facility])   . Wound infection   . Goals of care, counseling/discussion   . Palliative care encounter   . 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 Chronic ulcer of right lower extremity with necrosis of muscle ([facility])   . PAD (peripheral artery disease) ([facility])   . Pressure injury of skin 09/26/2016  . Depression 09/26/2016  . Anorexia 09/26/2016  . Subacute delirium 09/26/2016  . Cellulitis of leg October 02, 2016  . Unstable angina ([facility]) 02/21/2015    Palliative Care Assessment & Plan   HPI: 66 y.o. male  with past medical history of diastolic CHF, CAD, STEMI, ESRD on HD, diabetes mellitus, osteomyelitis, recent transmetatarsal amputation admitted on 02-Oct-2016 with worsening leg pain with infection and hypoglycemia. Continues with gangrenous foot ulcers and vasopressors. Not eating (per wife was not eating at home either). Prognosis is very poor. Last dialysis treatment on 9/21. Transitioned to comfort measures and transferred out of ICU on 9/27.   Assessment: Septic shock Hypotension BLE wounds (declines AKA)  ESRD on hemodialysis Hypoglycemia Chronic diastolic heart failure PVD Demand ischemia Afib   Recommendations/Plan:  Patient interested in further HD if possible, also understanding he may decline through the weekend requiring hospice services.  He is likely  experiencing surge of energy at EOL.   Chaplain consult placed to complete new AD packet in order to document wife as HCPOA per patient request.   Medications as needed for comfort.   Discussed with Dr. Imogene Burn and Dr. Wynelle Link. Monitor through the weekend.   PMT not at [facility] through the weekend but will f/u Monday.    Code Status:  DNR  Prognosis:   < 2 weeks  Discharge Planning:  Pending-likely will need hospice facility   Thank you for allowing the Palliative Medicine Team to assist in the care of this patient.   Total Time 1015-1115, 1330-1440  Prolonged Time Billed  yes      Greater than 50%  of this time was spent counseling and coordinating care related to the above assessment and plan.  Vennie Homans, FNP-C Palliative Medicine Team  Phone: [phone] Fax: [phone]

## 2016-10-03 NOTE — Progress Notes (Signed)
[employer] - [place] at [facility]   PATIENT NAME: Travis [name]    MR#:  [id number]  DATE OF BIRTH:  10-14-1950  SUBJECTIVE:   No complaint.  Neo drip is discontinued. REVIEW OF SYSTEMS:    Review of Systems  Constitutional: He has generalized weakness  HENT: Negative for ear pain, nosebleeds, congestion, facial swelling, rhinorrhea, neck pain, neck stiffness and ear discharge.   Respiratory: Negative for cough, shortness of breath, wheezing  Cardiovascular: Negative for chest pain, palpitations and leg swelling.  Gastrointestinal: Negative for heartburn, abdominal pain, ositos nausea and vomiting no diarrhea or constipation Genitourinary: Negative for dysuria, urgency, frequency, hematuria Musculoskeletal: Negative for back pain or joint pain Neurological: Negative for dizziness, seizures, syncope, focal weakness,  numbness and headaches.  Hematological: Does  bruise/bleed easily.  Psychiatric/Behavioral: Negative for hallucinations, confusion, dysphoric mood SKIN: b/l wound infections   Tolerating Diet:  yes DRUG ALLERGIES:  No Known Allergies  VITALS:  Blood pressure (!) 93/49, pulse 91, temperature 97.6 F (36.4 C), temperature source Oral, resp. rate 18, height  (1.676 m), weight 194 lb 3.6 oz (88.1 kg), SpO2 97 %.  PHYSICAL EXAMINATION:  Constitutional: Appears Chronically ill appearing HENT: Normocephalic. Marland Kitchen Oropharynx is clear and moist.  Eyes: Conjunctivae and EOM are normal. PERRLA, no scleral icterus.  Neck: Normal ROM. Neck supple. No JVD. No tracheal deviation. CVS: Irregular, irregular S1/S2 +, no murmurs, no gallops, no carotid bruit.  Pulmonary: Effort and breath sounds normal, no stridor, rhonchi, wheezes, rales.  Abdominal: Soft. BS +,  no distension, tenderness, rebound or guarding.  Musculoskeletal: He is able to move all extremities.  Neuro: Alert. CN 2-12 grossly intact. No focal deficits. Skin: b/l LE ulcerations That are  wrapped. Psychiatric: Depressed  mood and affect.  LABORATORY PANEL:   CBC  Recent Labs Lab 10/02/16 0456  WBC 7.4  HGB 10.1*  HCT 29.2*  PLT 120*   ------------------------------------------------------------------------------------------------------------------  Chemistries   Recent Labs Lab 09/27/16 0729  10/02/16 0456  NA 131*  < > 127*  K 3.3*  < > 5.2*  CL 98*  < > 91*  CO2 24  < > 24  GLUCOSE 128*  < > 151*  BUN 23*  < > 61*  CREATININE 3.51*  < > 7.12*  CALCIUM 8.5*  < > 8.6*  MG 1.9  --   --   < > = values in this interval not displayed. ------------------------------------------------------------------------------------------------------------------  Cardiac Enzymes  Recent Labs Lab 09/26/16 2135 09/27/16 0729 09/27/16 1345  TROPONINI 1.40* 1.27* 1.12*   ------------------------------------------------------------------------------------------------------------------  RADIOLOGY:  No results found.   ASSESSMENT AND PLAN:   66 y.o. male CAD s/p CABG, hx of STEMI, T2DM, ESRD on HD, PVD w/ a hx of bilateral toe and transmetatarsal amputation and chronic non-healing LE wounds. He is followed in [facility] wound clinic complicated by medical non-compliance presented to [facility] ED upon referral from hemodialysis for hypoglycemia and B/L lower extremity wounds.   1. Septic shock with hypotension, tachycardia and fevers from bilateral LE wounds discontinue Neo drip and broad-spectrum antibiotics Blood cultures remain negative. Started and discontinued midodrine.  2. Bilateral lower extremity wounds. Patient is followed at [facility] wound clinic. He has extensive eschar on all wounds. Patient has been seen and evaluated by surgery as well as podiatry and vascular surgery.  Patient is high risk for above-knee amputations and does not desire above-knee amputations. As per vascular surgery no role for debridement for local procedures for angiogram at this  point.  Management by per wound care  vancomycin and Zosyn are discontinued.  Wound culture: MRSA.  3. Hypoglycemia: He was on  D10 drip due to poor by mouth intake  4. ESRD on hemodialysis:  He was on HD but would not tolerate intermittent hemodialysis treatment due to hypotension.  5. Chronic diastolic heart failure with preserved ejection fraction/chronic ischemic heart disease with history of CABG: No signs of acute exacerbation at this time  6. Hypokalemia:  Improved.  * Hyperkalemia.  7. Hyponatremia: Na down to 127.  8. Anemia of chronic disease: Hemoglobin range stable  9. Elevated bilirubin: Abdominal ultrasound without evidence of acute abdominal pathology.  10. Peripheral vascular disease: discontinued aspirin and Plavix  11. Essential hypertension due to severe sepsis with hypotension blood pressure medications discontinued.  12. Chronic atrial fibrillation:CHADS2VASC score 4 As per his cardiologist he is not a candidate for anticoagulation given his renal disease and noncompliance with increased risk of high bleeding and falls   13. Elevated troponin in the setting of septic shock and poor renal clearance: Discussed with intensivist he did not feel cardiology   14. Nausea and vomiting: Right upper quadrant ultrasound shows sludge and KUB shows no acute abnormality This has resolved  15. Depression: Patient was evaluated by psychiatry. Remeron 7.5 mg was added to his regimen.  16. Moderate malnutrition: Continue nutritional supplements and omega-3 for wound healing.   Patient has very poor overall prognosis.  CODE STATUS is changed to DO NOT RESUSCITATE after palliative care consultation.  Discussed with Megan. Per Palliative care NP Megan, on comfort care now, may get hospice home this coming week.  Management plans discussed with the patient and his wife, they are in agreement.  CODE STATUS: DNR.   TOTAL TIME TAKING CARE OF THIS PATIENT: 35 minutes.    POSSIBLE D/C 2-3 days.  Discussed with intensivist Shaune Pollack M.D on 10/03/2016 at 3:32 PM  Between 7am to 6pm - Pager - [phone] After 6pm go to [email or web address] - Social research officer, government  [facility]  Office  [phone]  CC: Primary care physician; [facility]  Note: This dictation was prepared with Nurse, children's dictation along with smaller phrase technology. Any transcriptional errors that result from this process are unintentional.

## 2016-10-03 NOTE — Progress Notes (Signed)
Pt had loose stoole that was maroon colored stool.

## 2016-10-03 NOTE — Progress Notes (Signed)
Dr Imogene Burn made aware that NT reports maroon stool also noted that early morning pt had black stool, acknowledged, no new orders

## 2016-10-03 NOTE — Progress Notes (Signed)
[facility] visited, patient with healthcare team. Prayer outside the room.   10/03/16 1500  Clinical Encounter Type  Visited With Patient not available  Visit Type Initial  Referral From Nurse  Spiritual Encounters  Spiritual Needs Prayer

## 2016-10-06 NOTE — Discharge Summary (Signed)
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 CHF (congestive heart failure) (HCC)   . Chronic kidney disease   . Coronary artery disease   . Diabetes mellitus without complication (HCC)   . Dialysis patient (HCC)   . Osteomyelitis [facility])    HOSPITAL COURSE:   66 y.o.male CAD s/p CABG, hx of STEMI, T2DM, ESRD on HD, PVD w/ a hx of bilateral toe and transmetatarsal amputation and chronic non-healing LE wounds. He is followed in [facility] wound clinic complicated by medical non-compliance presented to [facility] ED upon referral from hemodialysis for hypoglycemia and B/L lower extremity wounds.   1. Septic shock with hypotension, tachycardia and fevers from bilateral LE wound infection. discontinue Neo drip and broad-spectrum antibiotics Blood cultures was negative. Started and discontinuedmidodrine.  2. Bilateral lower extremity wounds. Patient is followed at [facility] wound clinic. He has extensive eschar on all wounds. Patient has been seen and evaluated by surgery as well as podiatry and vascular surgery.    Patient is high risk for above-knee amputations and does not desire above-knee amputations. As per vascular surgery no role for debridement for local procedures for angiogram at this point.  Management by per wound care  vancomycin and Zosyn were discontinued.  Wound culture: MRSA.  3. Hypoglycemia: He was on  D10 drip due to poor by mouth intake  4. ESRD on hemodialysis:  He was on HD but would not tolerate intermittent hemodialysis treatment due to hypotension.  5. Chronic diastolic heart failure with preserved ejection fraction/chronic ischemic heart disease with history of CABG: No signs of acute exacerbation at this time  6. Hypokalemia:  Improved.  * Hyperkalemia. The patient was on comfort care, no hemodialysis.  7. Hyponatremia: Na down to 127.  8. Anemia of chronic disease: Hemoglobin range stable  9. Elevated bilirubin: Abdominal ultrasound without evidence of acute abdominal pathology.  10. Peripheral vascular disease: discontinued aspirin and Plavix  11. Essential hypertension due to severe sepsis with hypotension blood pressure medications discontinued.  12. Chronic atrial fibrillation:CHADS2VASC score 4 As per his cardiologist he is not a candidate for anticoagulation given his renal disease and noncompliance with increased risk of high bleeding and falls   13. Elevated troponin in the setting of septic shock and poor renal clearance.  14. Nausea and vomiting: Right upper quadrant ultrasound shows sludge and KUB shows no acute abnormality This resolved  15. Depression: Patient was evaluated by psychiatry. Remeron 7.5 mg was added to his regimen.  16. Moderate malnutrition: Continue nutritional supplements and omega-3 for wound healing.  Patient has very poor overall prognosis.  CODE STATUS was changed to DO NOT RESUSCITATE after palliative care consultation.  Hewas put on comfort care and transfer from ICU to medical floor after  palliative care team discussed with his wife.  The patient expired at 17:05 on 09/13/2016.   Shaune Pollack M.D on 10/05/2016 at 12:16 PM  Between 7am to 6pm - Pager - [phone]  After 6pm go to [email or web address] - Scientist, research (life sciences) [facility]  Office  [phone]  CC: Primary care physician; [facility]   Note: This dictation was prepared with Nurse, children's dictation along with smaller phrase technology. Any transcriptional errors that result from this process are unintentional.

## 2016-10-06 NOTE — Progress Notes (Signed)
[employer] - [place] at [facility]   PATIENT NAME: Travis [name]    MR#:  [id number]  DATE OF BIRTH:  April 22, 1950  SUBJECTIVE:   On comfort care. Rectal bleeding from rectal tube. REVIEW OF SYSTEMS:    Review of Systems  confused DRUG ALLERGIES:  No Known Allergies  VITALS:  Blood pressure (!) 89/50, pulse 89, temperature (!) 97.5 F (36.4 C), resp. rate 16, height  (1.676 m), weight 194 lb 3.6 oz (88.1 kg), SpO2 100 %.  PHYSICAL EXAMINATION:  Constitutional: Appears Chronically ill appearing HENT: Normocephalic. Eyes: Conjunctivae and EOM are normal. no scleral icterus.  Neck: Normal ROM. Neck supple. No JVD. No tracheal deviation. CVS: Irregular, irregular S1/S2 +, no murmurs, no gallops, no carotid bruit.  Pulmonary: Effort and breath sounds normal, no stridor, rhonchi, wheezes, rales.  Abdominal: Soft. BS +,  no distension, tenderness, rebound or guarding.  Musculoskeletal: He is able to move all extremities.  Neuro: confused. Skin: b/l LE ulcerations That are wrapped. Psychiatric: confused. LABORATORY PANEL:   CBC  Recent Labs Lab 10/02/16 0456  WBC 7.4  HGB 10.1*  HCT 29.2*  PLT 120*   ------------------------------------------------------------------------------------------------------------------  Chemistries   Recent Labs Lab 10/02/16 0456  NA 127*  K 5.2*  CL 91*  CO2 24  GLUCOSE 151*  BUN 61*  CREATININE 7.12*  CALCIUM 8.6*   ------------------------------------------------------------------------------------------------------------------  Cardiac Enzymes  Recent Labs Lab 09/27/16 1345  TROPONINI 1.12*   ------------------------------------------------------------------------------------------------------------------  RADIOLOGY:  No results found.   ASSESSMENT AND PLAN:   66 y.o. male CAD s/p CABG, hx of STEMI, T2DM, ESRD on HD, PVD w/ a hx of bilateral toe and transmetatarsal amputation and chronic  non-healing LE wounds. He is followed in [facility] wound clinic complicated by medical non-compliance presented to [facility] ED upon referral from hemodialysis for hypoglycemia and B/L lower extremity wounds.   1. Septic shock with hypotension, tachycardia and fevers from bilateral LE wounds discontinue Neo drip and broad-spectrum antibiotics Blood cultures remain negative. Started and discontinued midodrine.  2. Bilateral lower extremity wounds. Patient is followed at [facility] wound clinic. He has extensive eschar on all wounds. Patient has been seen and evaluated by surgery as well as podiatry and vascular surgery.  Patient is high risk for above-knee amputations and does not desire above-knee amputations. As per vascular surgery no role for debridement for local procedures for angiogram at this point.  Management by per wound care  vancomycin and Zosyn are discontinued.  Wound culture: MRSA.  3. Hypoglycemia: He was on  D10 drip due to poor by mouth intake  4. ESRD on hemodialysis:  He was on HD but would not tolerate intermittent hemodialysis treatment due to hypotension.  5. Chronic diastolic heart failure with preserved ejection fraction/chronic ischemic heart disease with history of CABG: No signs of acute exacerbation at this time  6. Hypokalemia:  Improved.  * Hyperkalemia.  7. Hyponatremia: Na down to 127.  8. Anemia of chronic disease: Hemoglobin range stable  9. Elevated bilirubin: Abdominal ultrasound without evidence of acute abdominal pathology.  10. Peripheral vascular disease: discontinued aspirin and Plavix  11. Essential hypertension due to severe sepsis with hypotension blood pressure medications discontinued.  12. Chronic atrial fibrillation:CHADS2VASC score 4 As per his cardiologist he is not a candidate for anticoagulation given his renal disease and noncompliance with increased risk of high bleeding and falls   13. Elevated troponin in the setting of septic  shock and poor renal clearance: Discussed with intensivist  he did not feel cardiology   14. Nausea and vomiting: Right upper quadrant ultrasound shows sludge and KUB shows no acute abnormality This has resolved  15. Depression: Patient was evaluated by psychiatry. Remeron 7.5 mg was added to his regimen.  16. Moderate malnutrition: Continue nutritional supplements and omega-3 for wound healing.   Patient has very poor overall prognosis.  CODE STATUS is changed to DO NOT RESUSCITATE after palliative care consultation.  Discussed with Megan. Per Palliative care NP Megan, on comfort care now, may get hospice home this coming week.  Management plans discussed with the patient and his wife, they are in agreement.  CODE STATUS: DNR.   TOTAL TIME TAKING CARE OF THIS PATIENT: 20 minutes.   POSSIBLE D/C to hospice home in 2 days.  Discussed with intensivist Larron Armor M.D on 10/02/2016 aShaune PollackPM  Between 7am to 6pm - Pager - [phone] After 6pm go to [email or web address] - Social research officer, government  [facility] [place] [facility]  Office  [phone]  CC: Primary care physician; [facility]  Note: This dictation was prepared with Nurse, children's dictation along with smaller phrase technology. Any transcriptional errors that result from this process are unintentional.

## 2016-10-06 NOTE — Progress Notes (Signed)
Dr Imogene Burn notified that patient passed away at 1705. MD acknowledged.

## 2016-10-06 NOTE — Progress Notes (Signed)
[facility]  ROUNDING NOTE   Subjective:   Confused. No family at bedside. Family still deciding on hospice.   Last dialysis was 9/21  Objective:  Vital signs in last 24 hours:  Temp:  [97.5 F (36.4 C)] 97.5 F (36.4 C) (09/29 0606) Pulse Rate:  [89] 89 (09/29 0606) Resp:  [16] 16 (09/29 0606) BP: (89)/(50) 89/50 (09/29 0606) SpO2:  [100 %] 100 % (09/29 0606)  Weight change:  Filed Weights   09/30/16 0500 10/01/16 0456 10/02/16 0830  Weight: 86.1 kg (189 lb 13.1 oz) 84.9 kg (187 lb 2.7 oz) 88.1 kg (194 lb 3.6 oz)    Intake/Output: I/O last 3 completed shifts: In: 330 [P.O.:330] Out: -    Intake/Output this shift:  No intake/output data recorded.  Physical Exam: General: Ill appearing  Head: Normocephalic, atraumatic. Dry oral mucosal membranes  Eyes: Eyes closed  Neck: Supple, trachea midline  Lungs:  Diminished bilaterally   Heart: S1S2 no rubs  Abdomen:  Soft, nontender, bowel sounds present  Extremities: Bilateral lower extremeties wrapped, 1+ bilateral LE edema.  Neurologic: Waxing and waning mental status  Skin: No rashes  Access: Left AVF    Basic Metabolic Panel:  Recent Labs Lab 09/28/16 0628 09/29/16 0750 09/29/16 1059 09/30/16 0208 10/01/16 0504 10/02/16 0456  NA 132* 130*  --   --  128* 127*  K 3.6 4.7  --   --  4.3 5.2*  CL 95* 95*  --   --  94* 91*  CO2 26 23  --   --  24 24  GLUCOSE 119* 104*  --   --  109* 151*  BUN 33* 44*  --   --  52* 61*  CREATININE 4.49* 5.36*  --   --  6.66* 7.12*  CALCIUM 8.8* 8.7*  --   --  8.6* 8.6*  PHOS  --   --  5.3* 5.9*  --   --     Liver Function Tests: No results for input(s): AST, ALT, ALKPHOS, BILITOT, PROT, ALBUMIN in the last 168 hours. No results for input(s): LIPASE, AMYLASE in the last 168 hours. No results for input(s): AMMONIA in the last 168 hours.  CBC:  Recent Labs Lab 09/28/16 0628 09/29/16 0750 10/02/16 0456  WBC 8.8 9.5 7.4  HGB 9.2* 9.9* 10.1*  HCT 27.0*  28.3* 29.2*  MCV 100.5* 101.3* 99.7  PLT 159 141* 120*    Cardiac Enzymes:  Recent Labs Lab 09/27/16 1345  TROPONINI 1.12*    BNP: Invalid input(s): POCBNP  CBG:  Recent Labs Lab 10/01/16 1956 10/02/16 0000 10/02/16 0416 10/02/16 0733 10/02/16 1157  GLUCAP 127* 148* 138* 153* 169*    Microbiology: Results for orders placed or performed during the hospital encounter of 2016/10/20  Blood Culture (routine x 2)     Status: None   Collection Time: 20-Oct-2016 12:21 PM  Result Value Ref Range Status   Specimen Description BLOOD RIGHT AC  Final   Special Requests   Final    BOTTLES DRAWN AEROBIC AND ANAEROBIC Blood Culture adequate volume   Culture NO GROWTH 5 DAYS  Final   Report Status 09/29/2016 FINAL  Final  Blood Culture (routine x 2)     Status: None   Collection Time: 10-20-16 12:21 PM  Result Value Ref Range Status   Specimen Description BLOOD BLOOD RIGHT HAND  Final   Special Requests   Final    BOTTLES DRAWN AEROBIC AND ANAEROBIC Blood Culture results may not  be optimal due to an inadequate volume of blood received in culture bottles   Culture NO GROWTH 5 DAYS  Final   Report Status 09/29/2016 FINAL  Final  Surgical PCR screen     Status: Abnormal   Collection Time: 2016/10/09  6:31 PM  Result Value Ref Range Status   MRSA, PCR POSITIVE (A) NEGATIVE Final    Comment: RESULT CALLED TO, READ BACK BY AND VERIFIED WITH: CRYSTAL BASS AT 2041 2016-10-09 BY TFK    Staphylococcus aureus POSITIVE (A) NEGATIVE Final    Comment: (NOTE) The Xpert SA Assay (FDA approved for NASAL specimens in patients 63 years of age and older), is one component of a comprehensive surveillance program. It is not intended to diagnose infection nor to guide or monitor treatment.   Aerobic Culture (superficial specimen)     Status: None   Collection Time: 09/25/16  2:30 PM  Result Value Ref Range Status   Specimen Description FOOT  Final   Special Requests Normal  Final   Gram Stain    Final    RARE WBC PRESENT,BOTH PMN AND MONONUCLEAR RARE GRAM POSITIVE COCCI RARE GRAM NEGATIVE RODS    Culture   Final    MODERATE METHICILLIN RESISTANT STAPHYLOCOCCUS AUREUS WITHIN MIXED CULTURE Performed at [facility], [address] [place], [place] [address]    Report Status 09/29/2016 FINAL  Final   Organism ID, Bacteria METHICILLIN RESISTANT STAPHYLOCOCCUS AUREUS  Final      Susceptibility   Methicillin resistant staphylococcus aureus - MIC*    CIPROFLOXACIN >=8 RESISTANT Resistant     ERYTHROMYCIN >=8 RESISTANT Resistant     GENTAMICIN <=0.5 SENSITIVE Sensitive     OXACILLIN >=4 RESISTANT Resistant     TETRACYCLINE <=1 SENSITIVE Sensitive     VANCOMYCIN <=0.5 SENSITIVE Sensitive     TRIMETH/SULFA <=10 SENSITIVE Sensitive     CLINDAMYCIN RESISTANT Resistant     RIFAMPIN <=0.5 SENSITIVE Sensitive     Inducible Clindamycin POSITIVE Resistant     * MODERATE METHICILLIN RESISTANT STAPHYLOCOCCUS AUREUS    Coagulation Studies: No results for input(s): LABPROT, INR in the last 72 hours.  Urinalysis: No results for input(s): COLORURINE, LABSPEC, PHURINE, GLUCOSEU, HGBUR, BILIRUBINUR, KETONESUR, PROTEINUR, UROBILINOGEN, NITRITE, LEUKOCYTESUR in the last 72 hours.  Invalid input(s): APPERANCEUR    Imaging: No results found.   Medications:    . fentaNYL  12.5 mcg Transdermal Q72H  . hydrocortisone sod succinate (SOLU-CORTEF) inj  50 mg Intravenous Q8H  . mouth rinse  15 mL Mouth Rinse BID  . sodium chloride flush  10-40 mL Intracatheter Q12H   acetaminophen **OR** acetaminophen, albuterol, fentaNYL (SUBLIMAZE) injection, glycopyrrolate, LORazepam, [DISCONTINUED] ondansetron **OR** ondansetron (ZOFRAN) IV, sodium chloride flush  Assessment/ Plan:  66 y.o. male with diabetes, hypertension, coronary artery disease s/p CABG 2011, anemia of CKD, SHPTH, who presents now with b/l LE ulcerations  MWF [facility] [address]   1. End Stage Renal Disease: Holding  dialysis due to hypotension.Transitioned to comfort care.  Family wants no aggressive measures. He has already decided to be DNR/DNI.  Plan on withdrawal of care  Last hemodialysis was Friday, 9/21 Appreciate palliative care.   2. Hypotension. With sepsis and vascular disease   3. Anemia of chronic kidney disease: hemoglobin 9.9 No EPO  4. Secondary Hyperparathyroidism: currently not on binders.   Overall prognosis poor. Discussed case with palliative care and family with Spanish Interpreter 9/25. Plan on moving towards comfort care and plan on withdrawal of care.  LOS: 10 Travis [name] 10/14/1810:33 PM

## 2016-10-06 DEATH — deceased
